# Patient Record
Sex: Female | Born: 1935 | Race: Black or African American | Hispanic: No | Smoking: Former smoker
Health system: Southern US, Community
[De-identification: ages and names within clinical notes are randomized; demographics above are authoritative.]

## PROBLEM LIST (undated history)

## (undated) DIAGNOSIS — M5430 Sciatica, unspecified side: Secondary | ICD-10-CM

## (undated) DIAGNOSIS — M549 Dorsalgia, unspecified: Secondary | ICD-10-CM

## (undated) DIAGNOSIS — Z8249 Family history of ischemic heart disease and other diseases of the circulatory system: Secondary | ICD-10-CM

## (undated) DIAGNOSIS — Z8679 Personal history of other diseases of the circulatory system: Secondary | ICD-10-CM

## (undated) DIAGNOSIS — R51 Headache: Secondary | ICD-10-CM

## (undated) DIAGNOSIS — T85848A Pain due to other internal prosthetic devices, implants and grafts, initial encounter: Secondary | ICD-10-CM

## (undated) DIAGNOSIS — E785 Hyperlipidemia, unspecified: Secondary | ICD-10-CM

## (undated) DIAGNOSIS — Z833 Family history of diabetes mellitus: Secondary | ICD-10-CM

## (undated) DIAGNOSIS — Z8261 Family history of arthritis: Secondary | ICD-10-CM

## (undated) DIAGNOSIS — F329 Major depressive disorder, single episode, unspecified: Secondary | ICD-10-CM

## (undated) DIAGNOSIS — I10 Essential (primary) hypertension: Secondary | ICD-10-CM

## (undated) DIAGNOSIS — E7800 Pure hypercholesterolemia, unspecified: Secondary | ICD-10-CM

## (undated) DIAGNOSIS — E669 Obesity, unspecified: Secondary | ICD-10-CM

## (undated) DIAGNOSIS — F32A Depression, unspecified: Secondary | ICD-10-CM

## (undated) DIAGNOSIS — R0602 Shortness of breath: Secondary | ICD-10-CM

## (undated) HISTORY — DX: Pure hypercholesterolemia, unspecified: E78.00

## (undated) HISTORY — DX: Family history of diabetes mellitus: Z83.3

## (undated) HISTORY — DX: Family history of ischemic heart disease and other diseases of the circulatory system: Z82.49

## (undated) HISTORY — PX: ABDOMINAL HYSTERECTOMY: SHX81

## (undated) HISTORY — DX: Essential (primary) hypertension: I10

## (undated) HISTORY — PX: SPINE SURGERY: SHX786

## (undated) HISTORY — DX: Sciatica, unspecified side: M54.30

## (undated) HISTORY — DX: Obesity, unspecified: E66.9

## (undated) HISTORY — DX: Personal history of other diseases of the circulatory system: Z86.79

## (undated) HISTORY — DX: Hyperlipidemia, unspecified: E78.5

## (undated) HISTORY — DX: Headache: R51

## (undated) HISTORY — DX: Dorsalgia, unspecified: M54.9

## (undated) HISTORY — DX: Family history of arthritis: Z82.61

## (undated) HISTORY — PX: COLONOSCOPY: SHX174

---

## 2000-05-12 ENCOUNTER — Encounter: Payer: Self-pay | Admitting: Family Medicine

## 2000-05-12 ENCOUNTER — Encounter: Admission: RE | Admit: 2000-05-12 | Discharge: 2000-05-12 | Payer: Self-pay | Admitting: Diagnostic Radiology

## 2001-04-25 ENCOUNTER — Ambulatory Visit (HOSPITAL_COMMUNITY): Admission: RE | Admit: 2001-04-25 | Discharge: 2001-04-25 | Payer: Self-pay | Admitting: Internal Medicine

## 2001-04-25 ENCOUNTER — Encounter: Payer: Self-pay | Admitting: Internal Medicine

## 2001-04-28 ENCOUNTER — Encounter (HOSPITAL_COMMUNITY): Admission: RE | Admit: 2001-04-28 | Discharge: 2001-05-28 | Payer: Self-pay | Admitting: Internal Medicine

## 2001-05-17 ENCOUNTER — Encounter: Payer: Self-pay | Admitting: Internal Medicine

## 2001-05-17 ENCOUNTER — Encounter: Admission: RE | Admit: 2001-05-17 | Discharge: 2001-05-17 | Payer: Self-pay | Admitting: Internal Medicine

## 2001-07-04 ENCOUNTER — Other Ambulatory Visit: Admission: RE | Admit: 2001-07-04 | Discharge: 2001-07-04 | Payer: Self-pay | Admitting: Internal Medicine

## 2001-07-11 ENCOUNTER — Ambulatory Visit (HOSPITAL_COMMUNITY): Admission: RE | Admit: 2001-07-11 | Discharge: 2001-07-11 | Payer: Self-pay | Admitting: Internal Medicine

## 2001-07-11 ENCOUNTER — Encounter: Payer: Self-pay | Admitting: Internal Medicine

## 2001-08-29 ENCOUNTER — Encounter: Payer: Self-pay | Admitting: Orthopedic Surgery

## 2001-08-29 ENCOUNTER — Ambulatory Visit (HOSPITAL_COMMUNITY): Admission: RE | Admit: 2001-08-29 | Discharge: 2001-08-29 | Payer: Self-pay | Admitting: Orthopedic Surgery

## 2002-04-19 ENCOUNTER — Encounter: Payer: Self-pay | Admitting: Internal Medicine

## 2002-04-19 ENCOUNTER — Ambulatory Visit (HOSPITAL_COMMUNITY): Admission: RE | Admit: 2002-04-19 | Discharge: 2002-04-19 | Payer: Self-pay | Admitting: Internal Medicine

## 2002-05-18 ENCOUNTER — Encounter: Payer: Self-pay | Admitting: Internal Medicine

## 2002-05-18 ENCOUNTER — Encounter: Admission: RE | Admit: 2002-05-18 | Discharge: 2002-05-18 | Payer: Self-pay | Admitting: Internal Medicine

## 2003-06-10 ENCOUNTER — Encounter: Payer: Self-pay | Admitting: Internal Medicine

## 2003-06-10 ENCOUNTER — Encounter: Admission: RE | Admit: 2003-06-10 | Discharge: 2003-06-10 | Payer: Self-pay | Admitting: Internal Medicine

## 2004-08-13 ENCOUNTER — Encounter: Admission: RE | Admit: 2004-08-13 | Discharge: 2004-08-13 | Payer: Self-pay | Admitting: General Surgery

## 2004-10-09 ENCOUNTER — Ambulatory Visit (HOSPITAL_COMMUNITY): Admission: RE | Admit: 2004-10-09 | Discharge: 2004-10-09 | Payer: Self-pay | Admitting: Family Medicine

## 2004-11-24 ENCOUNTER — Ambulatory Visit: Payer: Self-pay | Admitting: Family Medicine

## 2004-12-24 ENCOUNTER — Ambulatory Visit: Payer: Self-pay | Admitting: Family Medicine

## 2004-12-27 HISTORY — PX: OTHER SURGICAL HISTORY: SHX169

## 2005-02-22 ENCOUNTER — Ambulatory Visit: Payer: Self-pay | Admitting: Family Medicine

## 2005-05-04 ENCOUNTER — Ambulatory Visit: Payer: Self-pay | Admitting: Family Medicine

## 2005-05-04 LAB — CONVERTED CEMR LAB: Pap Smear: NORMAL

## 2005-05-27 ENCOUNTER — Ambulatory Visit (HOSPITAL_COMMUNITY): Admission: RE | Admit: 2005-05-27 | Discharge: 2005-05-27 | Payer: Self-pay | Admitting: General Surgery

## 2005-05-30 ENCOUNTER — Emergency Department (HOSPITAL_COMMUNITY): Admission: EM | Admit: 2005-05-30 | Discharge: 2005-05-30 | Payer: Self-pay | Admitting: Emergency Medicine

## 2005-05-31 ENCOUNTER — Inpatient Hospital Stay (HOSPITAL_COMMUNITY): Admission: AD | Admit: 2005-05-31 | Discharge: 2005-06-07 | Payer: Self-pay | Admitting: Orthopaedic Surgery

## 2005-05-31 ENCOUNTER — Ambulatory Visit: Payer: Self-pay | Admitting: Cardiology

## 2005-06-02 ENCOUNTER — Encounter: Payer: Self-pay | Admitting: Orthopedic Surgery

## 2005-06-09 ENCOUNTER — Emergency Department (HOSPITAL_COMMUNITY): Admission: EM | Admit: 2005-06-09 | Discharge: 2005-06-10 | Payer: Self-pay | Admitting: *Deleted

## 2005-06-28 ENCOUNTER — Emergency Department (HOSPITAL_COMMUNITY): Admission: EM | Admit: 2005-06-28 | Discharge: 2005-06-28 | Payer: Self-pay | Admitting: Emergency Medicine

## 2005-07-20 ENCOUNTER — Ambulatory Visit: Payer: Self-pay | Admitting: Family Medicine

## 2005-08-02 ENCOUNTER — Ambulatory Visit: Payer: Self-pay | Admitting: Family Medicine

## 2005-08-13 ENCOUNTER — Ambulatory Visit (HOSPITAL_COMMUNITY): Admission: RE | Admit: 2005-08-13 | Discharge: 2005-08-13 | Payer: Self-pay | Admitting: Orthopaedic Surgery

## 2005-08-13 ENCOUNTER — Encounter: Payer: Self-pay | Admitting: Orthopedic Surgery

## 2005-08-20 ENCOUNTER — Encounter (HOSPITAL_COMMUNITY): Admission: RE | Admit: 2005-08-20 | Discharge: 2005-09-24 | Payer: Self-pay | Admitting: Orthopaedic Surgery

## 2005-09-14 ENCOUNTER — Ambulatory Visit: Payer: Self-pay | Admitting: Family Medicine

## 2005-09-27 ENCOUNTER — Encounter (HOSPITAL_COMMUNITY): Admission: RE | Admit: 2005-09-27 | Discharge: 2005-10-27 | Payer: Self-pay | Admitting: Orthopaedic Surgery

## 2005-10-28 ENCOUNTER — Encounter (HOSPITAL_COMMUNITY): Admission: RE | Admit: 2005-10-28 | Discharge: 2005-11-27 | Payer: Self-pay | Admitting: Orthopaedic Surgery

## 2005-11-24 ENCOUNTER — Ambulatory Visit: Payer: Self-pay | Admitting: Family Medicine

## 2005-11-24 ENCOUNTER — Ambulatory Visit (HOSPITAL_COMMUNITY): Admission: RE | Admit: 2005-11-24 | Discharge: 2005-11-24 | Payer: Self-pay | Admitting: Family Medicine

## 2006-11-14 ENCOUNTER — Ambulatory Visit: Payer: Self-pay | Admitting: Family Medicine

## 2006-11-15 ENCOUNTER — Ambulatory Visit (HOSPITAL_COMMUNITY): Admission: RE | Admit: 2006-11-15 | Discharge: 2006-11-15 | Payer: Self-pay | Admitting: Family Medicine

## 2006-11-30 ENCOUNTER — Ambulatory Visit: Payer: Self-pay | Admitting: Family Medicine

## 2006-12-26 ENCOUNTER — Ambulatory Visit: Payer: Self-pay | Admitting: Family Medicine

## 2007-02-06 ENCOUNTER — Other Ambulatory Visit: Admission: RE | Admit: 2007-02-06 | Discharge: 2007-02-06 | Payer: Self-pay | Admitting: Family Medicine

## 2007-02-06 ENCOUNTER — Ambulatory Visit: Payer: Self-pay | Admitting: Family Medicine

## 2007-02-06 ENCOUNTER — Encounter: Payer: Self-pay | Admitting: Family Medicine

## 2007-03-06 ENCOUNTER — Ambulatory Visit: Payer: Self-pay | Admitting: Family Medicine

## 2007-03-06 LAB — CONVERTED CEMR LAB
CO2: 23 meq/L (ref 19–32)
Chloride: 108 meq/L (ref 96–112)
HDL: 66 mg/dL (ref >39)
LDL Cholesterol: 112 mg/dL — ABNORMAL HIGH (ref 0–99)
Sodium: 146 meq/L — ABNORMAL HIGH (ref 135–145)
Total CHOL/HDL Ratio: 3.1
VLDL: 24 mg/dL (ref 0–40)

## 2007-12-04 ENCOUNTER — Ambulatory Visit: Payer: Self-pay | Admitting: Family Medicine

## 2007-12-13 ENCOUNTER — Ambulatory Visit (HOSPITAL_COMMUNITY): Admission: RE | Admit: 2007-12-13 | Discharge: 2007-12-13 | Payer: Self-pay | Admitting: Family Medicine

## 2007-12-28 HISTORY — PX: BACK SURGERY: SHX140

## 2008-01-04 ENCOUNTER — Ambulatory Visit: Payer: Self-pay | Admitting: Orthopedic Surgery

## 2008-01-04 DIAGNOSIS — Z8679 Personal history of other diseases of the circulatory system: Secondary | ICD-10-CM | POA: Insufficient documentation

## 2008-01-04 DIAGNOSIS — M5430 Sciatica, unspecified side: Secondary | ICD-10-CM

## 2008-01-04 DIAGNOSIS — M549 Dorsalgia, unspecified: Secondary | ICD-10-CM

## 2008-02-01 ENCOUNTER — Ambulatory Visit: Payer: Self-pay | Admitting: Family Medicine

## 2008-02-07 ENCOUNTER — Encounter: Payer: Self-pay | Admitting: Family Medicine

## 2008-02-07 DIAGNOSIS — E785 Hyperlipidemia, unspecified: Secondary | ICD-10-CM

## 2008-02-07 DIAGNOSIS — R519 Headache, unspecified: Secondary | ICD-10-CM | POA: Insufficient documentation

## 2008-02-07 DIAGNOSIS — R32 Unspecified urinary incontinence: Secondary | ICD-10-CM

## 2008-02-07 DIAGNOSIS — R51 Headache: Secondary | ICD-10-CM

## 2008-02-27 ENCOUNTER — Telehealth: Payer: Self-pay | Admitting: Orthopedic Surgery

## 2008-03-01 ENCOUNTER — Telehealth: Payer: Self-pay | Admitting: Orthopedic Surgery

## 2008-03-05 ENCOUNTER — Emergency Department (HOSPITAL_COMMUNITY): Admission: EM | Admit: 2008-03-05 | Discharge: 2008-03-05 | Payer: Self-pay | Admitting: Emergency Medicine

## 2008-03-12 ENCOUNTER — Ambulatory Visit (HOSPITAL_COMMUNITY): Admission: RE | Admit: 2008-03-12 | Discharge: 2008-03-12 | Payer: Self-pay | Admitting: Internal Medicine

## 2008-03-19 ENCOUNTER — Ambulatory Visit: Payer: Self-pay | Admitting: Orthopedic Surgery

## 2008-03-27 ENCOUNTER — Telehealth: Payer: Self-pay | Admitting: Orthopedic Surgery

## 2008-03-28 ENCOUNTER — Telehealth: Payer: Self-pay | Admitting: Orthopedic Surgery

## 2008-03-29 ENCOUNTER — Encounter: Payer: Self-pay | Admitting: Orthopedic Surgery

## 2008-04-09 ENCOUNTER — Telehealth: Payer: Self-pay | Admitting: Orthopedic Surgery

## 2008-04-11 ENCOUNTER — Encounter: Payer: Self-pay | Admitting: Orthopedic Surgery

## 2008-04-11 ENCOUNTER — Ambulatory Visit (HOSPITAL_COMMUNITY): Admission: RE | Admit: 2008-04-11 | Discharge: 2008-04-12 | Payer: Self-pay | Admitting: Neurological Surgery

## 2008-04-15 ENCOUNTER — Encounter: Payer: Self-pay | Admitting: Orthopedic Surgery

## 2008-08-22 ENCOUNTER — Ambulatory Visit: Payer: Self-pay | Admitting: Family Medicine

## 2008-08-22 DIAGNOSIS — M65311 Trigger thumb, right thumb: Secondary | ICD-10-CM

## 2008-08-22 DIAGNOSIS — J301 Allergic rhinitis due to pollen: Secondary | ICD-10-CM

## 2008-08-22 DIAGNOSIS — M818 Other osteoporosis without current pathological fracture: Secondary | ICD-10-CM | POA: Insufficient documentation

## 2008-09-11 ENCOUNTER — Ambulatory Visit: Payer: Self-pay | Admitting: Orthopedic Surgery

## 2008-10-03 ENCOUNTER — Telehealth: Payer: Self-pay | Admitting: Family Medicine

## 2008-10-09 ENCOUNTER — Encounter: Payer: Self-pay | Admitting: Family Medicine

## 2008-10-09 ENCOUNTER — Ambulatory Visit: Payer: Self-pay | Admitting: Orthopedic Surgery

## 2008-10-09 DIAGNOSIS — M25569 Pain in unspecified knee: Secondary | ICD-10-CM | POA: Insufficient documentation

## 2008-10-09 LAB — CONVERTED CEMR LAB
Basophils Absolute: 0 10*3/uL (ref 0.0–0.1)
Cholesterol: 234 mg/dL — ABNORMAL HIGH (ref 0–200)
Eosinophils Relative: 1 % (ref 0–5)
HDL: 73 mg/dL (ref >39)
Lymphocytes Relative: 37 % (ref 12–46)
Neutro Abs: 5.2 10*3/uL (ref 1.7–7.7)
Neutrophils Relative %: 53 % (ref 43–77)
Platelets: 392 10*3/uL (ref 150–400)
RDW: 16.2 % — ABNORMAL HIGH (ref 11.5–15.5)
Total CHOL/HDL Ratio: 3.2
Triglycerides: 176 mg/dL — ABNORMAL HIGH (ref <150)

## 2008-10-10 ENCOUNTER — Ambulatory Visit: Payer: Self-pay | Admitting: Family Medicine

## 2008-10-10 LAB — CONVERTED CEMR LAB
BUN: 13 mg/dL (ref 6–23)
Glucose, Bld: 89 mg/dL (ref 70–99)
Potassium: 4.3 meq/L (ref 3.5–5.3)
TSH: 1.336 microintl units/mL (ref 0.350–4.50)

## 2008-12-09 ENCOUNTER — Encounter: Payer: Self-pay | Admitting: Orthopedic Surgery

## 2008-12-10 ENCOUNTER — Ambulatory Visit: Payer: Self-pay | Admitting: Family Medicine

## 2008-12-10 DIAGNOSIS — M542 Cervicalgia: Secondary | ICD-10-CM | POA: Insufficient documentation

## 2008-12-23 ENCOUNTER — Ambulatory Visit (HOSPITAL_COMMUNITY): Admission: RE | Admit: 2008-12-23 | Discharge: 2008-12-23 | Payer: Self-pay | Admitting: Family Medicine

## 2009-01-02 ENCOUNTER — Telehealth: Payer: Self-pay | Admitting: Family Medicine

## 2009-01-30 ENCOUNTER — Telehealth: Payer: Self-pay | Admitting: Family Medicine

## 2009-02-24 ENCOUNTER — Encounter: Payer: Self-pay | Admitting: Family Medicine

## 2009-02-24 ENCOUNTER — Ambulatory Visit: Payer: Self-pay | Admitting: Family Medicine

## 2009-02-24 ENCOUNTER — Other Ambulatory Visit: Admission: RE | Admit: 2009-02-24 | Discharge: 2009-02-24 | Payer: Self-pay | Admitting: Family Medicine

## 2009-02-24 DIAGNOSIS — F419 Anxiety disorder, unspecified: Secondary | ICD-10-CM

## 2009-02-24 DIAGNOSIS — R195 Other fecal abnormalities: Secondary | ICD-10-CM

## 2009-02-24 DIAGNOSIS — F329 Major depressive disorder, single episode, unspecified: Secondary | ICD-10-CM

## 2009-02-24 DIAGNOSIS — H543 Unqualified visual loss, both eyes: Secondary | ICD-10-CM | POA: Insufficient documentation

## 2009-02-24 DIAGNOSIS — M79609 Pain in unspecified limb: Secondary | ICD-10-CM | POA: Insufficient documentation

## 2009-03-19 ENCOUNTER — Encounter (INDEPENDENT_AMBULATORY_CARE_PROVIDER_SITE_OTHER): Payer: Self-pay | Admitting: *Deleted

## 2009-04-08 ENCOUNTER — Ambulatory Visit (HOSPITAL_COMMUNITY): Admission: RE | Admit: 2009-04-08 | Discharge: 2009-04-08 | Payer: Self-pay | Admitting: Ophthalmology

## 2009-04-22 ENCOUNTER — Ambulatory Visit (HOSPITAL_COMMUNITY): Admission: RE | Admit: 2009-04-22 | Discharge: 2009-04-22 | Payer: Self-pay | Admitting: Ophthalmology

## 2009-05-06 ENCOUNTER — Ambulatory Visit: Payer: Self-pay | Admitting: Family Medicine

## 2009-07-08 ENCOUNTER — Ambulatory Visit: Payer: Self-pay | Admitting: Family Medicine

## 2009-07-14 ENCOUNTER — Ambulatory Visit (HOSPITAL_COMMUNITY): Admission: RE | Admit: 2009-07-14 | Discharge: 2009-07-14 | Payer: Self-pay | Admitting: Family Medicine

## 2009-08-19 ENCOUNTER — Ambulatory Visit (HOSPITAL_COMMUNITY): Admission: RE | Admit: 2009-08-19 | Discharge: 2009-08-19 | Payer: Self-pay | Admitting: Ophthalmology

## 2009-08-22 ENCOUNTER — Telehealth: Payer: Self-pay | Admitting: Family Medicine

## 2009-10-29 ENCOUNTER — Telehealth: Payer: Self-pay | Admitting: Family Medicine

## 2009-12-10 ENCOUNTER — Ambulatory Visit: Payer: Self-pay | Admitting: Family Medicine

## 2009-12-10 DIAGNOSIS — R0989 Other specified symptoms and signs involving the circulatory and respiratory systems: Secondary | ICD-10-CM

## 2009-12-18 ENCOUNTER — Telehealth: Payer: Self-pay | Admitting: Family Medicine

## 2010-01-05 ENCOUNTER — Ambulatory Visit: Payer: Self-pay | Admitting: Orthopedic Surgery

## 2010-02-03 ENCOUNTER — Telehealth: Payer: Self-pay | Admitting: Family Medicine

## 2010-02-12 ENCOUNTER — Ambulatory Visit: Payer: Self-pay | Admitting: Family Medicine

## 2010-02-12 DIAGNOSIS — R5381 Other malaise: Secondary | ICD-10-CM

## 2010-02-12 DIAGNOSIS — R5383 Other fatigue: Secondary | ICD-10-CM

## 2010-04-28 ENCOUNTER — Ambulatory Visit: Payer: Self-pay | Admitting: Family Medicine

## 2010-04-28 DIAGNOSIS — L259 Unspecified contact dermatitis, unspecified cause: Secondary | ICD-10-CM

## 2010-05-07 ENCOUNTER — Ambulatory Visit: Payer: Self-pay | Admitting: Orthopedic Surgery

## 2010-05-27 ENCOUNTER — Encounter (HOSPITAL_COMMUNITY): Admission: RE | Admit: 2010-05-27 | Discharge: 2010-06-26 | Payer: Self-pay | Admitting: Preventative Medicine

## 2010-07-09 ENCOUNTER — Encounter: Payer: Self-pay | Admitting: Orthopedic Surgery

## 2010-07-24 ENCOUNTER — Telehealth: Payer: Self-pay | Admitting: Family Medicine

## 2010-09-02 ENCOUNTER — Telehealth: Payer: Self-pay | Admitting: Family Medicine

## 2010-09-02 ENCOUNTER — Ambulatory Visit: Payer: Self-pay | Admitting: Orthopedic Surgery

## 2010-09-02 DIAGNOSIS — M549 Dorsalgia, unspecified: Secondary | ICD-10-CM

## 2010-09-07 ENCOUNTER — Ambulatory Visit: Payer: Self-pay | Admitting: Family Medicine

## 2010-09-07 DIAGNOSIS — R7302 Impaired glucose tolerance (oral): Secondary | ICD-10-CM | POA: Insufficient documentation

## 2010-09-07 DIAGNOSIS — M899 Disorder of bone, unspecified: Secondary | ICD-10-CM | POA: Insufficient documentation

## 2010-09-07 DIAGNOSIS — M949 Disorder of cartilage, unspecified: Secondary | ICD-10-CM

## 2010-09-07 DIAGNOSIS — R0602 Shortness of breath: Secondary | ICD-10-CM

## 2010-09-10 ENCOUNTER — Telehealth: Payer: Self-pay | Admitting: Orthopedic Surgery

## 2010-09-11 LAB — CONVERTED CEMR LAB
Basophils Absolute: 0 10*3/uL (ref 0.0–0.1)
CO2: 25 meq/L (ref 19–32)
Calcium: 8.9 mg/dL (ref 8.4–10.5)
Cholesterol: 206 mg/dL — ABNORMAL HIGH (ref 0–200)
Eosinophils Absolute: 0.2 10*3/uL (ref 0.0–0.7)
Eosinophils Relative: 2 % (ref 0–5)
HCT: 40.9 % (ref 36.0–46.0)
HDL: 53 mg/dL (ref >39)
Lymphocytes Relative: 36 % (ref 12–46)
Platelets: 389 10*3/uL (ref 150–400)
RDW: 15.8 % — ABNORMAL HIGH (ref 11.5–15.5)
Sodium: 142 meq/L (ref 135–145)
Total CHOL/HDL Ratio: 3.9
Triglycerides: 168 mg/dL — ABNORMAL HIGH (ref <150)

## 2010-09-14 ENCOUNTER — Telehealth: Payer: Self-pay | Admitting: Orthopedic Surgery

## 2010-09-24 ENCOUNTER — Encounter: Admission: RE | Admit: 2010-09-24 | Discharge: 2010-09-24 | Payer: Self-pay | Admitting: Orthopedic Surgery

## 2010-10-07 ENCOUNTER — Ambulatory Visit: Payer: Self-pay | Admitting: Orthopedic Surgery

## 2010-10-07 DIAGNOSIS — M5126 Other intervertebral disc displacement, lumbar region: Secondary | ICD-10-CM

## 2010-10-08 ENCOUNTER — Encounter (INDEPENDENT_AMBULATORY_CARE_PROVIDER_SITE_OTHER): Payer: Self-pay | Admitting: *Deleted

## 2010-10-13 ENCOUNTER — Telehealth: Payer: Self-pay | Admitting: Orthopedic Surgery

## 2010-10-23 ENCOUNTER — Encounter: Admission: RE | Admit: 2010-10-23 | Discharge: 2010-10-23 | Payer: Self-pay | Admitting: Orthopedic Surgery

## 2010-11-09 ENCOUNTER — Encounter: Admission: RE | Admit: 2010-11-09 | Discharge: 2010-11-09 | Payer: Self-pay | Admitting: Orthopedic Surgery

## 2010-11-12 ENCOUNTER — Telehealth: Payer: Self-pay | Admitting: Family Medicine

## 2010-11-25 ENCOUNTER — Telehealth: Payer: Self-pay | Admitting: Family Medicine

## 2010-12-07 ENCOUNTER — Ambulatory Visit: Payer: Self-pay | Admitting: Family Medicine

## 2010-12-25 ENCOUNTER — Telehealth (INDEPENDENT_AMBULATORY_CARE_PROVIDER_SITE_OTHER): Payer: Self-pay | Admitting: *Deleted

## 2011-01-17 ENCOUNTER — Encounter: Payer: Self-pay | Admitting: Family Medicine

## 2011-01-18 ENCOUNTER — Encounter: Payer: Self-pay | Admitting: Family Medicine

## 2011-01-19 ENCOUNTER — Ambulatory Visit: Admit: 2011-01-19 | Payer: Self-pay | Admitting: Family Medicine

## 2011-01-26 NOTE — Assessment & Plan Note (Signed)
Summary: office visit   Vital Signs:  Patient profile:   77 year old female Menstrual status:  hysterectomy Height:      64 inches Weight:      219.25 pounds BMI:     37.77 O2 Sat:      94 % Pulse rate:   84 / minute Pulse rhythm:   regular Resp:     16 per minute BP sitting:   130 / 74  (left arm) Cuff size:   large  Vitals Entered By: Everitt Amber LPN (Apr 28, 101 3:02 PM)  Nutrition Counseling: Patient's BMI is greater than 25 and therefore counseled on weight management options. CC: Follow up chronic problems   Primary Care Provider:  Syliva Overman MD  CC:  Follow up chronic problems.  History of Present Illness: Pt reports that she has started itching ever since she started tramadol, in  upper arms, axilla, and chest ant and post, this has been going on for the past 2 weeks.  left trigger thumb x 2 weeks .  She states today would have been her 48th wedding anniversarry, she is a bit depressed, misses her dead spouse, and discussesthe costof gettingassistance with transport even to gop to the grocery store. She c/o pain in multiple joints, back hip s and needs a pain med, has used vicodin in the past with good results She isnot consitently exercising, noir has she changed her diet to facilitate any weight loss.  Current Medications (verified): 1)  Oscal 500/200 D-3 500-200 Mg-Unit Tabs (Calcium-Vitamin D) .... Take 1 Tablet By Mouth Three Times A Day 2)  Alprazolam 0.25 Mg Tabs (Alprazolam) .... One Tab By Mouth Qhs 3)  Tramadol Hcl 50 Mg Tabs (Tramadol Hcl) .... Take 1 Tablet By Mouth Two Times A Day As Needed 4)  Antivert 12.5 Mg Tabs (Meclizine Hcl) .... One Tab By Mouth Three Times A Day As Needed 5)  Sertraline Hcl 25 Mg Tabs (Sertraline Hcl) .... Take 1 Tablet By Mouth Once A Day  Allergies (verified): 1)  ! Pcn 2)  ! * Tramadol  Review of Systems      See HPI General:  Complains of fatigue; denies chills and fever. Eyes:  Denies blurring, discharge, eye  pain, and red eye. ENT:  Denies hoarseness, nasal congestion, sinus pressure, and sore throat. CV:  Complains of difficulty breathing while lying down; denies chest pain or discomfort, palpitations, shortness of breath with exertion, and swelling of feet; due to lg abd girth. Resp:  Denies cough and sputum productive. GI:  Denies abdominal pain, constipation, diarrhea, nausea, and vomiting. GU:  Denies dysuria and urinary frequency. MS:  Complains of joint pain and stiffness; right hip and knee pain unccontrolled , wants vicodin in place of tramadol. Derm:  Denies itching, lesion(s), and rash. Neuro:  Denies headaches, poor balance, seizures, and sensation of room spinning. Psych:  Complains of anxiety and depression; notes improvement with the sertraline. Endo:  Denies cold intolerance, excessive hunger, excessive thirst, and excessive urination. Heme:  Denies abnormal bruising and bleeding. Allergy:  Complains of seasonal allergies.  Physical Exam  General:  Well-developed,well-nourished,in no acute distress; alert,appropriate and cooperative throughout examination. HEENT: No facial asymmetry,  EOMI, No sinus tenderness, TM's Clear, oropharynx  pink and moist. decreased ROM cervical spine.   Chest: Clear to auscultation bilaterally.  CVS: S1, S2, No murmurs, No S3.   Abd: Soft, Nontender.  VO:[id number]  ROM spine,and  in hips,adequate in  shoulders and knees.  Ext: No edema.   CNS: CN 2-12 intact, power tone and sensation normal throughout.   Skin: Intact, no visible lesions or rashes.  Psych: Good eye contact, normal affect.  Memory intact, not anxious or depressed appearing.    Impression & Recommendations:  Problem # 1:  TRIGGER FINGER, LEFT THUMB (ICD-727.03) Assessment Comment Only  Orders: Orthopedic Referral (Ortho)  Problem # 2:  DERMATITIS (ICD-692.9) Assessment: Deteriorated  Her updated medication list for this problem includes:    Prednisone (pak) 5 Mg Tabs  (Prednisone) ..... Use as directed reaction to tramadol  Problem # 3:  HYPERLIPIDEMIA (ICD-272.4) Assessment: Comment Only    HDL:73 (10/09/2008), 66 (03/06/2007)  LDL:126 (10/09/2008), 112 (03/06/2007)  Chol:234 (10/09/2008), 202 (03/06/2007)  Trig:176 (10/09/2008), 119 (03/06/2007) low fat diet discussed and encouraged  Problem # 4:  OBESITY (ICD-278.00) Assessment: Unchanged  Ht: 64 (04/28/2010)   Wt: 219.25 (04/28/2010)   BMI: 37.77 (04/28/2010)  Problem # 5:  DEPRESSION (ICD-311) Assessment: Improved  Her updated medication list for this problem includes:    Alprazolam 0.25 Mg Tabs (Alprazolam) ..... One tab by mouth qhs    Sertraline Hcl 25 Mg Tabs (Sertraline hcl) .Marland Kitchen... Take 1 tablet by mouth once a day  Complete Medication List: 1)  Oscal 500/200 D-3 500-200 Mg-unit Tabs (Calcium-vitamin d) .... Take 1 tablet by mouth three times a day 2)  Alprazolam 0.25 Mg Tabs (Alprazolam) .... One tab by mouth qhs 3)  Antivert 12.5 Mg Tabs (Meclizine hcl) .... One tab by mouth three times a day as needed 4)  Sertraline Hcl 25 Mg Tabs (Sertraline hcl) .... Take 1 tablet by mouth once a day 5)  Vicodin 5-500 Mg Tabs (Hydrocodone-acetaminophen) .... Take 1 tablet by mouth two times a day as needed 6)  Prednisone (pak) 5 Mg Tabs (Prednisone) .... Use as directed  Patient Instructions: 1)  Please schedule a follow-up appointment in 3 months. 2)  fasting labs mid june. 3)  New med for pain. 4)  Meds are sent in for itching 5)  It is important that you exercise regularly at least 20 minutes 5 times a week. If you develop chest pain, have severe difficulty breathing, or feel very tired , stop exercising immediately and seek medical attention. 6)  You need to lose weight. Consider a lower calorie diet and regular exercise.  Prescriptions: PREDNISONE (PAK) 5 MG TABS (PREDNISONE) Use as directed  #21 x 0   Entered and Authorized by:   Syliva Overman MD   Signed by:   Syliva Overman MD on  04/28/2010   Method used:   Electronically to        [facility] [address] [phone]* (retail)       [address] [place], [place]  [address]       Ph: [phone]       Fax: [phone]   RxID:   [id number] VICODIN 5-500 MG TABS (HYDROCODONE-ACETAMINOPHEN) Take 1 tablet by mouth two times a day as needed  #60 x 1   Entered and Authorized by:   Syliva Overman MD   Signed by:   Syliva Overman MD on 04/28/2010   Method used:   Printed then faxed to ...       [facility] [address] [phone]* (retail)       [address], [place]  [address]       Ph: [phone]  Fax: [phone]   RxID:   [id number]

## 2011-01-26 NOTE — Assessment & Plan Note (Signed)
Summary: TRIGGER FINGER LT THUMB/REF M.SIMPSON/MEDICARE,TRICARE/CAF   Vital Signs:  Patient profile:   77 year old female Menstrual status:  hysterectomy Height:      63 inches Weight:      219 pounds Pulse rate:   68 / minute Resp:     16 per minute  Vitals Entered By: Fuller Canada MD (May 07, 2010 8:53 AM)  Visit Type:  new problem Referring Provider:  Dr. Lodema Hong Primary Provider:  Syliva Overman MD  CC:  left trigger thumb.  History of Present Illness: 77 year old female presents with triggering of her LEFT thumb previous history of triggering treated with injection x2 with good relief  Comes in today with several weeks of pain over the A1 pulley with triggering and locking phenomenon associated with swelling.    Meds in EMR.    Allergies: 1)  ! Pcn 2)  ! * Tramadol  Past History:  Past Medical History: Last updated: 02/07/2008 High Blood Pressure cholesterol high HEADACHE (ICD-784.0) URINARY INCONTINENCE (ICD-788.30) OBESITY (ICD-278.00) HYPERLIPIDEMIA (ICD-272.4) HYPERTENSION (ICD-401.9) SCIATICA (ICD-724.3) BACK PAIN (ICD-724.5) FAMILY HISTORY OF ARTHRITIS (ICD-V17.7) FAMILY HISTORY CORONARY HEART DISEASE FEMALE < 65 (ICD-V17.3) FAMILY HISTORY OF DIABETES (ICD-V18.0) HIGH BLOOD PRESSURE (ICD-V12.50)  Past Surgical History: Last updated: 08/22/2008 right knee surgery after a fracture in 2006 Hysterectomy Back surgery 2009  Social History: Patient is widowed since 2009 Former Smoker Alcohol use-no Drug use-no Retired one child no caffeine use  Review of Systems Constitutional:  Complains of weight gain; denies weight loss, fever, chills, and fatigue. Cardiovascular:  Denies chest pain, palpitations, fainting, and murmurs. Respiratory:  Complains of wheezing; denies short of breath, couch, tightness, pain on inspiration, and snoring . Gastrointestinal:  Denies heartburn, nausea, vomiting, diarrhea, constipation, and blood in your  stools. Genitourinary:  Denies frequency, urgency, difficulty urinating, painful urination, flank pain, and bleeding in urine. Neurologic:  Denies numbness, tingling, unsteady gait, dizziness, tremors, and seizure. Musculoskeletal:  Denies joint pain, swelling, instability, stiffness, redness, heat, and muscle pain. Endocrine:  Denies excessive thirst, exessive urination, and heat or cold intolerance. Psychiatric:  Complains of depression; denies nervousness, anxiety, and hallucinations. Skin:  Denies changes in the skin, poor healing, rash, itching, and redness. HEENT:  Complains of blurred or double vision; denies eye pain, redness, and watering. Immunology:  Complains of seasonal allergies; denies sinus problems and allergic to bee stings. Hemoatologic:  Denies easy bleeding and brusing.  Physical Exam  Additional Exam:  normal development grooming hygiene.  Normal perfusion to the LEFT thumb.  Skin intact.  Sensation normal and LEFT thumb.  Swelling over the A1 pulley with tenderness LEFT thumb.  Flexion-extension of the IP joint normal.  Motor exam normal flexion power at the IP joint joint stable but triggering and locking noted.     Impression & Recommendations:  Problem # 1:  TRIGGER FINGER, LEFT THUMB (ICD-727.03) Assessment New  Verbal consent was obtained: The finger was prepped with ethyl chloride and injected with 1:1 injection of .25% sensorcaine, 1cc  and 40 mg of depomedrol, 1cc. There were no complications.  Orders: Est. Patient Level III ([address]) Injection, Tendon / Ligament ([address])  Patient Instructions: 1)  You have received an injection of cortisone today. You may experience increased pain at the injection site. Apply ice pack to the area for 20 minutes every 2 hours and take 2 xtra strength tylenol every 8 hours. This increased pain will usually resolve in 24 hours. The injection will take effect in 3-10 days.  2)  Please schedule a follow-up appointment as  needed.

## 2011-01-26 NOTE — Letter (Signed)
Summary: History form  History form   Imported By: Jacklynn Ganong 05/13/2010 09:16:31  _____________________________________________________________________  External Attachment:    Type:   Image     Comment:   External Document

## 2011-01-26 NOTE — Progress Notes (Signed)
Summary: MRI appointment.  Phone Note Outgoing Call   Call placed by: Waldon Reining,  September 10, 2010 3:49 PM Call placed to: Patient Action Taken: Appt scheduled Summary of Call: I called to give the patient her MRI appointment at [facility] on 09-12-10 at 3:30. Patient has Medicare, no precert is needed. Patient is aware to get a copy of her films bring with her to her follow up visit.

## 2011-01-26 NOTE — Progress Notes (Signed)
Summary: vicodin  Phone Note Call from Patient   Summary of Call: patient would like a prescription sent to [facility] for her vicodin.  Please advise thanks Initial call taken by: Curtis Sites,  July 24, 2010 11:49 AM  Follow-up for Phone Call        pls refill x 2 and let her kiow Follow-up by: Syliva Overman MD,  July 24, 2010 12:28 PM  Additional Follow-up for Phone Call Additional follow up Details #1::        [facility] to [facility] Additional Follow-up by: Everitt Amber LPN,  July 24, 2010 1:12 PM    Prescriptions: VICODIN 5-500 MG TABS (HYDROCODONE-ACETAMINOPHEN) Take 1 tablet by mouth two times a day as needed  #60 x 1   Entered by:   Everitt Amber LPN   Authorized by:   Syliva Overman MD   Signed by:   Everitt Amber LPN on 16/09/9603   Method used:   Printed then faxed to ...       [facility] [address] [phone]* (retail)       [address] [place], [place]  [address]       Ph: [phone]       Fax: [phone]   RxID:   [id number]   Appended Document: vicodin patient aware

## 2011-01-26 NOTE — Letter (Signed)
Summary: *Orthopedic No Show Letter  [facility]  [address]  [place], [place] [address]   Phone: [phone]  Fax: [phone]      07/09/2010   Darcey Cardy [address] [place], [place]  [address]    Dear Ms. Cassells,   Our records indicate that you missed your scheduled appointment with Dr. Beaulah Corin on 07/08/2010.  Please contact this office to reschedule your appointment as soon as possible.  It is important that you keep your scheduled appointments with your physician, so we can provide you the best care possible.        Sincerely,    Dr. Terrance Mass, MD [facility] Phone [phone]

## 2011-01-26 NOTE — Progress Notes (Signed)
Summary: refill  Phone Note Call from Patient   Summary of Call: would like to get refill on hydrocodine [phone] [facility] Initial call taken by: Rudene Anda,  September 02, 2010 3:44 PM  Follow-up for Phone Call        refill x1 tell her we have the flu vac also pls Follow-up by: Syliva Overman MD,  September 02, 2010 4:54 PM  Additional Follow-up for Phone Call Additional follow up Details #1::        Phone Call Completed, Rx Called In Additional Follow-up by: Adella Hare LPN,  September 03, 2010 1:46 PM    Prescriptions: VICODIN 5-500 MG TABS (HYDROCODONE-ACETAMINOPHEN) Take 1 tablet by mouth two times a day as needed  #60 x 0   Entered by:   Adella Hare LPN   Authorized by:   Syliva Overman MD   Signed by:   Adella Hare LPN on 78/29/5621   Method used:   Printed then faxed to ...       [facility] [address] [phone]* (retail)       [address], [place]  [address]       Ph: [phone]       Fax: [phone]   RxID:   [id number]

## 2011-01-26 NOTE — Assessment & Plan Note (Signed)
Summary: RE-CK RT KNEE/?NEW XRAY/MEDICARE,TRICARE/CAF   Visit Type:  Follow-up Referring Provider:  Dr. Lodema Hong Primary Provider:  Syliva Overman MD  CC:  right knee pain.  History of Present Illness: 77 year old female status post open treatment internal fixation of patella fracture in 2007 status post disc excision in 2009 fell last week her knee gave out she has some arthritis in the knee complains of lateral leg pain back pain and lateral knee pain  She went to urgent care for pain in her back she was sent to therapy and the back pain has but she still has some residual lateral leg pain which sounds like radiculopathy to me   currently on Vicodin for pain  Denies any bowel or bladder problems recent cancer or weight loss fever or night sweats  Allergies: 1)  ! Pcn 2)  ! * Tramadol 3)  ! * Oxycodone  Past History:  Past Medical History: Last updated: 02/07/2008 High Blood Pressure cholesterol high HEADACHE (ICD-784.0) URINARY INCONTINENCE (ICD-788.30) OBESITY (ICD-278.00) HYPERLIPIDEMIA (ICD-272.4) HYPERTENSION (ICD-401.9) SCIATICA (ICD-724.3) BACK PAIN (ICD-724.5) FAMILY HISTORY OF ARTHRITIS (ICD-V17.7) FAMILY HISTORY CORONARY HEART DISEASE FEMALE < 65 (ICD-V17.3) FAMILY HISTORY OF DIABETES (ICD-V18.0) HIGH BLOOD PRESSURE (ICD-V12.50)  Past Surgical History: Last updated: 08/22/2008 right knee surgery after a fracture in 2006 Hysterectomy Back surgery 2009  Family History: Last updated: 08/22/2008 Family History of Diabetes Family History Coronary Heart Disease  Family History of Arthritis mom  59  breast cancer  htn  dm living and age 66 dad  52  cva  htn deceased at age 83 sister deceased  46  htn , 2 sisters living brothers  4   1 deceased  cva  htn  1  dm   2  htn  Social History: Last updated: 05/07/2010 Patient is widowed since 2009 Former Smoker Alcohol use-no Drug use-no Retired one child no caffeine use  Risk Factors: Caffeine Use: 0  (01/04/2008)  Risk Factors: Smoking Status: quit (02/24/2009)  Social History: Reviewed history from 05/07/2010 and no changes required. Patient is widowed since 2009 Former Smoker Alcohol use-no Drug use-no Retired one child no caffeine use  Physical Exam  Additional Exam:  general appearance is normal she ambulates with a slightly waddling gait she is going to x3 mood and affect are normal  She does have some tenderness in her knee joint.  Her range of motion is approximately 115, her knee is stable.  She has good strength in the knee as well as the foot.  Skin is intact warm dry and pulses are normal no edema is noted sensation is normal.  She has tenderness in her RIGHT gluteal region lower lumbar spine and her straight leg raise seems to be positive.  Her reflexes remain intact     Impression & Recommendations:  Problem # 1:  KNEE PAIN (ICD-719.46)  x-rays of the knee again show mild to moderate arthritis at worst and no signs of internal derangement other than that.  Her hardware in the knee has no pain but it's not symptomatic  Recommend MRI of her back to care for epidural steroid injections noting that she had a 53 laminectomy microdiscectomy in 2009 has had recent back pain which has not improved with therapy or oral pain medication which was Vicodin.  I believe I have ruled out the knee as a cause of the pain by clinical exam and x-ray as well as symptoms  Her updated medication list for this problem includes:    Vicodin 5-500  Mg Tabs (Hydrocodone-acetaminophen) .Marland Kitchen... Take 1 tablet by mouth two times a day as needed  Orders: Est. Patient Level III ([address]) Knee x-ray,  3 views ([address])  Problem # 2:  SPINAL STENOSIS (ICD-724.00)  Orders: Est. Patient Level III (09811)  Patient Instructions: 1)  MRI L SPINE spinal stenosis

## 2011-01-26 NOTE — Progress Notes (Signed)
Summary: [facility] appointment  Phone Note From Other Clinic   Caller: Referral Coordinator Reason for Call: Schedule Patient Appt Summary of Call: Patient has an appointment at [facility] for an [facility] on 10-23-10 at 1:45. Initial call taken by: Waldon Reining,  October 13, 2010 11:38 AM

## 2011-01-26 NOTE — Miscellaneous (Signed)
Summary: faxed referral for [facility] L[facility]  Clinical Lists Changes

## 2011-01-26 NOTE — Progress Notes (Signed)
Summary: dizzy  Phone Note Call from Patient   Summary of Call: pt is dizzy and has some nausea [phone] Initial call taken by: Rudene Anda,  February 03, 2010 1:43 PM  Follow-up for Phone Call        returned call, no answer Follow-up by: Worthy Keeler LPN,  February 03, 2010 2:27 PM  Additional Follow-up for Phone Call Additional follow up Details #1::        feeling a little better  states she was yelling for granddaughter and sat down and head started spinning, says it happened last night too.  is dizzy and makes nauseous  [facility] Additional Follow-up by: Worthy Keeler LPN,  February 03, 2010 2:29 PM    Additional Follow-up for Phone Call Additional follow up Details #2::    advise and erx antivert 12.5 mg one 3 times daily as needed  #20, refill zero  Follow-up by: Syliva Overman MD,  February 03, 2010 4:47 PM  Additional Follow-up for Phone Call Additional follow up Details #3:: Details for Additional Follow-up Action Taken: patient aware and feeling much better  advised med at pharmacy if dizziness returns and if persists, needs appt Additional Follow-up by: Worthy Keeler LPN,  February 04, 2010 9:12 AM  New/Updated Medications: ANTIVERT 12.5 MG TABS (MECLIZINE HCL) one tab by mouth three times a day as needed Prescriptions: ANTIVERT 12.5 MG TABS (MECLIZINE HCL) one tab by mouth three times a day as needed  #20 x 0   Entered by:   Worthy Keeler LPN   Authorized by:   Syliva Overman MD   Signed by:   Worthy Keeler LPN on 91/47/8295   Method used:   Electronically to        [facility] [address] [phone]* (retail)       [address] [place]  [address]       Ph: [phone]       Fax: [phone]   RxID:   [id number]

## 2011-01-26 NOTE — Progress Notes (Signed)
Summary: pt had to re-schedule MRI   Phone Note Call from Patient   Caller: Patient Summary of Call: Patient called to relay that she cancelled her MRI appointment at [facility].  Said transportation did not work out.  She re-scheduled it with them for Thurs 9/22, 4:00pm.  She is again work'g on transportation.  I have cx'd the fol/up appt here for 9/22 as she will need to call back to re-schedule that also. Initial call taken by: Cammie Sickle,  September 14, 2010 4:21 PM

## 2011-01-26 NOTE — Assessment & Plan Note (Signed)
Summary: office visit   Vital Signs:  Patient profile:   77 year old female Menstrual status:  hysterectomy Height:      63 inches Weight:      228 pounds BMI:     40.53 O2 Sat:      97 % Pulse rate:   89 / minute Pulse rhythm:   regular Resp:     16 per minute BP sitting:   130 / 82  (right arm)  Vitals Entered By: Everitt Amber LPN (September 07, 2010 8:48 AM)  Nutrition Counseling: Patient's BMI is greater than 25 and therefore counseled on weight management options. CC: Follow up chronic problems   Primary Care Provider:  Syliva Overman MD  CC:  Follow up chronic problems.  History of Present Illness: Reports  that she ahs not been doing well;states she has been experiencing generalised puritus with bnlisters oon her handsintermittently since her l;ast visit. no new detergents opr pwersonal care items. she continues to experience significant stress   Denies recent fever or chills. Denies sinus pressure, nasal congestion , ear pain or sore throat. Denies chest congestion, or cough productive of sputum. Denies chest pain, palpitations, PND, orthopnea or leg swelling. Denies abdominal pain, nausea, vomitting, diarrhea or constipation. Denies change in bowel movements or bloody stool. Denies dysuria , frequency, incontinence or hesitancy. Denies  joint pain, swelling, or reduced mobility. Denies headaches, vertigo, seizures.     Current Medications (verified): 1)  Oscal 500/200 D-3 500-200 Mg-Unit Tabs (Calcium-Vitamin D) .... Take 1 Tablet By Mouth Three Times A Day 2)  Alprazolam 0.25 Mg Tabs (Alprazolam) .... One Tab By Mouth Qhs 3)  Sertraline Hcl 25 Mg Tabs (Sertraline Hcl) .... Take 1 Tablet By Mouth Once A Day 4)  Vicodin 5-500 Mg Tabs (Hydrocodone-Acetaminophen) .... Take 1 Tablet By Mouth Two Times A Day As Needed  Allergies (verified): 1)  ! Pcn 2)  ! * Tramadol 3)  ! * Oxycodone  Review of Systems      See HPI General:  Complains of fatigue and sleep  disorder; puritus sometimes affets sleep, but this is significantly relieved by benadryl . Eyes:  Denies blurring and discharge. Resp:  Complains of shortness of breath and wheezing; worse in the past 2 months, intermittent in the past 3 months with weight gain. MS:  Complains of joint pain, low back pain, mid back pain, and stiffness; right knee giving  out and pt experienceing back pain radiating down right thigh. Derm:  Complains of itching and rash; generalised itching with vesicular rashes. Neuro:  Complains of sensation of room spinning; pt was standing last week and experienced vertigo, she started spinning and fell backward to her night table , states the following day her symptoms resolved , she used no meds. Psych:  Complains of anxiety and depression; denies suicidal thoughts/plans, thoughts of violence, unusual visions or sounds, and thoughts /plans of harming others; pt reports alot of stress having top dealwith her grand-daughter. Endo:  Denies cold intolerance, excessive hunger, excessive thirst, and excessive urination. Heme:  Denies abnormal bruising and bleeding. Allergy:  Complains of seasonal allergies.  Physical Exam  General:  Well-developed,well-nourished,in no acute distress; alert,appropriate and cooperative throughout examination HEENT: No facial asymmetry,  EOMI, No sinus tenderness, TM's Clear, oropharynx  pink and moist.   Chest: Clear to auscultation bilaterally.  CVS: S1, S2, No murmurs, No S3.   Abd: Soft, Nontender.  MS: Adequate ROM spine, hips, shoulders and knees.  Ext: No edema.  CNS: CN 2-12 intact, power tone and sensation normal throughout.   Skin: Intact, healed blister x2 noted on hand, also erythematous patch on left forearm Psych: Good eye contact, normal affect.  Memory intact, not anxious or depressed appearing.    Impression & Recommendations:  Problem # 1:  SHORTNESS OF BREATH (ICD-786.05) Assessment Deteriorated  Orders: CXR- 2view  (CXR). likely due to weight gain and inc abdominal girth  Problem # 2:  SPINAL STENOSIS (ICD-724.00) Assessment: Deteriorated has upcoming MRI of spine  Problem # 3:  DERMATITIS (ICD-692.9) Assessment: Unchanged keflex prescribed, if symptoms persist pt to call back for derm eval  Problem # 4:  HYPERLIPIDEMIA (ICD-272.4) Assessment: Comment Only  Orders: T-Lipid Profile (04540-98119)    HDL:73 (10/09/2008), 66 (03/06/2007)  LDL:126 (10/09/2008), 112 (03/06/2007)  Chol:234 (10/09/2008), 202 (03/06/2007)  Trig:176 (10/09/2008), 119 (03/06/2007) Low fat diet discussed and encouraged, and literature also given  Problem # 5:  OBESITY (ICD-278.00) Assessment: Deteriorated  Ht: 63 (09/07/2010)   Wt: 228 (09/07/2010)   BMI: 40.53 (09/07/2010) therapeutic lifestyle change discussed and encouraged  Problem # 6:  DEPRESSION (ICD-311) Assessment: Improved  Her updated medication list for this problem includes:    Alprazolam 0.25 Mg Tabs (Alprazolam) ..... One tab by mouth qhs    Sertraline Hcl 25 Mg Tabs (Sertraline hcl) .Marland Kitchen... Take 1 tablet by mouth once a day    Hydroxyzine Hcl 25 Mg Tabs (Hydroxyzine hcl) .Marland Kitchen... Take 1 tab by mouth at bedtime as needed  Complete Medication List: 1)  Oscal 500/200 D-3 500-200 Mg-unit Tabs (Calcium-vitamin d) .... Take 1 tablet by mouth three times a day 2)  Alprazolam 0.25 Mg Tabs (Alprazolam) .... One tab by mouth qhs 3)  Sertraline Hcl 25 Mg Tabs (Sertraline hcl) .... Take 1 tablet by mouth once a day 4)  Vicodin 5-500 Mg Tabs (Hydrocodone-acetaminophen) .... Take 1 tablet by mouth two times a day as needed 5)  Hydroxyzine Hcl 25 Mg Tabs (Hydroxyzine hcl) .... Take 1 tab by mouth at bedtime as needed 6)  Keflex 500 Mg Caps (Cephalexin) .... Take 1 capsule by mouth two times a day  Other Orders: Influenza Vaccine MCR ([address]782) T-CBC w/Diff [phone]) T-TSH [phone]) T- Hemoglobin A1C (84132-44010) T-Basic Metabolic Panel  (27253-66440) T-Vitamin D (25-Hydroxy) (34742-59563)  Patient Instructions: 1)  Please schedule a follow-up appointment in 3 months. 2)  It is important that you exercise regularly at least 20 minutes 5 times a week. If you develop chest pain, have severe difficulty breathing, or feel very tired , stop exercising immediately and seek medical attention. 3)  You need to lose weight. Consider a lower calorie diet and regular exercise.  4)  Pls call back if you continue to have itching with rash. 5)  Meds are sent in for this and your bP is good. 6)  Pls sched your mamo Prescriptions: ALPRAZOLAM 0.25 MG TABS (ALPRAZOLAM) ONE TAB by mouth QHS  #30 x 4   Entered by:   Everitt Amber LPN   Authorized by:   Syliva Overman MD   Signed by:   Everitt Amber LPN on 87/56/4332   Method used:   Printed then faxed to ...       [facility] [address] [phone]* (retail)       [address] [place], [place]  [address]       Ph: [phone]       Fax: [phone]   RxID:   [id number] SERTRALINE HCL  25 MG TABS (SERTRALINE HCL) Take 1 tablet by mouth once a day  #30 x 4   Entered by:   Everitt Amber LPN   Authorized by:   Syliva Overman MD   Signed by:   Everitt Amber LPN on 60/45/4098   Method used:   Printed then faxed to ...       [facility] [address] [phone]* (retail)       [address] [place], [place]  [address]       Ph: [phone]       Fax: [phone]   RxID:   [id number] KEFLEX 500 MG CAPS (CEPHALEXIN) Take 1 capsule by mouth two times a day  #10 x 0   Entered and Authorized by:   Syliva Overman MD   Signed by:   Syliva Overman MD on 09/07/2010   Method used:   Electronically to        [facility] [address] [phone]* (retail)       [address] [place], [place]  [address]       Ph: [phone]       Fax: [phone]   RxID:   [id number] HYDROXYZINE HCL 25 MG TABS (HYDROXYZINE HCL) Take 1 tab by mouth at bedtime as needed  #30 x 3   Entered and Authorized  by:   Syliva Overman MD   Signed by:   Syliva Overman MD on 09/07/2010   Method used:   Electronically to        [facility] [address] [phone]* (retail)       [address] [place]  [address]       Ph: [phone]       Fax: [phone]   RxID:   [id number]    Influenza Vaccine (to be given today)

## 2011-01-26 NOTE — Progress Notes (Signed)
Summary: medicine  Phone Note Call from Patient   Summary of Call: needs her hydroco. send to [facility] send her medicine there until dec 31 she will have to change phar. and also feels like she water ih her ear and what is good for that send to [facility] or call her back at [phone] Initial call taken by: Lind Guest,  November 12, 2010 1:49 PM  Follow-up for Phone Call        Phone Call Completed Follow-up by: Adella Hare LPN,  November 12, 2010 3:05 PM    Prescriptions: VICODIN 5-500 MG TABS (HYDROCODONE-ACETAMINOPHEN) Take 1 tablet by mouth two times a day as needed  #60 x 0   Entered by:   Adella Hare LPN   Authorized by:   Syliva Overman MD   Signed by:   Adella Hare LPN on 56/38/7564   Method used:   Printed then faxed to ...       [facility] [address] [phone]* (retail)       [address], [place]  [address]       Ph: [phone]       Fax: [phone]   RxID:   [id number]

## 2011-01-26 NOTE — Assessment & Plan Note (Signed)
Summary: TRIGGER FINGER/MEDICARE/TRICARE/SIMPSON/BSF   Visit Type:  Follow-up Primary Provider:  Syliva Overman MD  CC:  triggering RIGHT long finger.  History of Present Illness: I saw [name] in the office today for a followup visit.  She is a 77 years old woman with the complaint of:  TRIGGER FINGER.  DX:  Trigger finger, right middle finger.  Treatment:  Injection.  MEDS:   Complaints:She says that her finger has been bothering her for 3 months now, comes and goes.  Today, scheduled for:  Requesting injection.  RIGHT long finger.  Tenderness over the A1 pulley with a catching sensation and palpable clicking with flexion of the digit. Alignment is normal.  Injection for triggering.  Verbal consent was obtained: The finger was prepped with ethyl chloride and injected with 1:1 injection of .25% sensorcaine, 1cc  and 40 mg of depomedrol, 1cc. There were no complications.   Assessment trigger finger recurrence.  Recommend followup as needed   Allergies: 1)  ! Pcn   Impression & Recommendations:  Problem # 1:  TRIGGER FINGER, RIGHT MIDDLE (ICD-727.03) Assessment Deteriorated  Orders: Est. Patient Level III ([address]) Depo- Medrol 40mg  (J1030) Joint Aspirate / Injection, Small (63016)  Patient Instructions: 1)  You have received an injection of cortisone today. You may experience increased pain at the injection site. Apply ice pack to the area for 20 minutes every 2 hours and take 2 xtra strength tylenol every 8 hours. This increased pain will usually resolve in 24 hours. The injection will take effect in 3-10 days.   2)  wants appt in hthe spring for the knee

## 2011-01-26 NOTE — Assessment & Plan Note (Signed)
Summary: F UP   Vital Signs:  Patient profile:   77 year old female Menstrual status:  hysterectomy Height:      64 inches Weight:      219.75 pounds BMI:     37.86 O2 Sat:      93 % Pulse rate:   91 / minute Pulse rhythm:   regular Resp:     16 per minute BP sitting:   120 / 60  (left arm) Cuff size:   large  Vitals Entered By: Everitt Amber LPN (February 12, 2010 2:07 PM)  Nutrition Counseling: Patient's BMI is greater than 25 and therefore counseled on weight management options. CC: Follow up chronic problems Is Patient Diabetic? No   Primary Care Provider:  Syliva Overman MD  CC:  Follow up chronic problems.  History of Present Illness: Reports  that she is doing fair. She gets depressed and lonely at times, missing her spouse, however she has alotof family member around. Denies recent fever or chills. Denies sinus pressure, nasal congestion , ear pain or sore throat. Denies chest congestion, or cough productive of sputum. Denies chest pain, palpitations, PND, orthopnea or leg swelling. Denies abdominal pain, nausea, vomitting, diarrhea or constipation. Denies change in bowel movements or bloody stool. Denies dysuria , frequency, incontinence or hesitancy. Reports back and joint pain,and reducedmobility. Denies headaches, vertigo, seizures. Denies depression, anxiety or insomnia. Denies  rash, lesions, or itch.     Current Medications (verified): 1)  Oscal 500/200 D-3 500-200 Mg-Unit Tabs (Calcium-Vitamin D) .... Take 1 Tablet By Mouth Three Times A Day 2)  Alprazolam 0.25 Mg Tabs (Alprazolam) .... One Tab By Mouth Qhs 3)  Tramadol Hcl 50 Mg Tabs (Tramadol Hcl) .... Take 1 Tablet By Mouth Two Times A Day As Needed 4)  Antivert 12.5 Mg Tabs (Meclizine Hcl) .... One Tab By Mouth Three Times A Day As Needed  Allergies (verified): 1)  ! Pcn  Review of Systems      See HPI Eyes:  Denies blurring and discharge. Heme:  Denies abnormal bruising and  bleeding. Allergy:  Complains of seasonal allergies.  Physical Exam  General:  Well-developed,well-nourished,in no acute distress; alert,appropriate and cooperative throughout examination. HEENT: No facial asymmetry,  EOMI, No sinus tenderness, TM's Clear, oropharynx  pink and moist. decreased ROM cervical spine. bilateral bruits  Chest: Clear to auscultation bilaterally.  CVS: S1, S2, No murmurs, No S3.   Abd: Soft, Nontender.  ZO:[id number]  ROM spine,adequate in hips, shoulders and knees.  Ext: No edema.   CNS: CN 2-12 intact, power tone and sensation normal throughout.   Skin: Intact, no visible lesions or rashes.  Psych: Good eye contact, normal affect.  Memory intact, not anxious or depressed appearing.    Impression & Recommendations:  Problem # 1:  DEPRESSION (ICD-311) Assessment Deteriorated  Her updated medication list for this problem includes:    Alprazolam 0.25 Mg Tabs (Alprazolam) ..... One tab by mouth qhs    Sertraline Hcl 25 Mg Tabs (Sertraline hcl) .Marland Kitchen... Take 1 tablet by mouth once a day  Problem # 2:  KNEE PAIN (JWJ-191.47) Assessment: Deteriorated  Her updated medication list for this problem includes:    Tramadol Hcl 50 Mg Tabs (Tramadol hcl) .Marland Kitchen... Take 1 tablet by mouth two times a day as needed  Problem # 3:  HYPERTENSION (ICD-401.9) Assessment: Improved  Orders: T-Basic Metabolic Panel [phone])  BP today: 120/60 Prior BP: 130/90 (12/10/2009)  Labs Reviewed: K+: 4.3 (10/10/2008) Creat: : 0.68 (10/10/2008)  Chol: 234 (10/09/2008)   HDL: 73 (10/09/2008)   LDL: 126 (10/09/2008)   TG: 176 (10/09/2008)  Problem # 4:  HYPERLIPIDEMIA (ICD-272.4) Assessment: Comment Only  Orders: T-Hepatic Function ([id number]) T-Lipid Profile ([id number])    HDL:73 (10/09/2008), 66 (03/06/2007)  LDL:126 (10/09/2008), 112 (03/06/2007)  Chol:234 (10/09/2008), 202 (03/06/2007)  Trig:176 (10/09/2008), 119 (03/06/2007)  Complete Medication List: 1)  Oscal  500/200 D-3 500-200 Mg-unit Tabs (Calcium-vitamin d) .... Take 1 tablet by mouth three times a day 2)  Alprazolam 0.25 Mg Tabs (Alprazolam) .... One tab by mouth qhs 3)  Tramadol Hcl 50 Mg Tabs (Tramadol hcl) .... Take 1 tablet by mouth two times a day as needed 4)  Antivert 12.5 Mg Tabs (Meclizine hcl) .... One tab by mouth three times a day as needed 5)  Sertraline Hcl 25 Mg Tabs (Sertraline hcl) .... Take 1 tablet by mouth once a day  Other Orders: T-TSH ([address]-78469) T-CBC w/Diff [phone]) T-Vitamin D (25-Hydroxy) [phone]) Future Orders: Radiology Referral (Radiology) ... 02/13/2010  Patient Instructions: 1)  Please schedule a follow-up appointment in 2 months. 2)  BMP prior to visit, ICD-9: 3)  Hepatic Panel prior to visit, ICD-9: 4)  Lipid Panel prior to visit, ICD-9:  fasting asap 5)  TSH prior to visit, ICD-9: 6)  CBC w/ Diff prior to visit, ICD-9: 7)  vitamin d level 8)  It is important that you exercise regularly at least 20 minutes 5 times a week. If you develop chest pain, have severe difficulty breathing, or feel very tired , stop exercising immediately and seek medical attention. 9)  You need to lose weight. Consider a lower calorie diet and regular exercise.  Prescriptions: TRAMADOL HCL 50 MG TABS (TRAMADOL HCL) Take 1 tablet by mouth two times a day as needed  #60 x 5   Entered by:   Everitt Amber LPN   Authorized by:   Syliva Overman MD   Signed by:   Everitt Amber LPN on 66/44/0347   Method used:   Printed then faxed to ...       [facility] [address] [phone]* (retail)       [address] [place]  [address]       Ph: [phone]       Fax: [phone]   RxID:   [id number] ALPRAZOLAM 0.25 MG TABS (ALPRAZOLAM) ONE TAB by mouth QHS  #30 x 5   Entered by:   Everitt Amber LPN   Authorized by:   Syliva Overman MD   Signed by:   Everitt Amber LPN on 42/70/6237   Method used:   Printed then faxed to ...       [facility] [address] [phone]*  (retail)       [address] [place], [place]  [address]       Ph: [phone]       Fax: [phone]   RxID:   [id number] SERTRALINE HCL 25 MG TABS (SERTRALINE HCL) Take 1 tablet by mouth once a day  #30 x 2   Entered and Authorized by:   Syliva Overman MD   Signed by:   Syliva Overman MD on 02/12/2010   Method used:   Electronically to        [facility] [address] [phone]* (retail)       [address]       [place], [place]  [address]  Ph: [phone]       Fax: [phone]   RxID:   [id number]

## 2011-01-26 NOTE — Assessment & Plan Note (Signed)
Summary: office viist   Vital Signs:  Patient profile:   77 year old female Menstrual status:  hysterectomy Height:      64 inches Weight:      212.31 pounds BMI:     36.57 O2 Sat:      94 % Pulse rate:   87 / minute Pulse rhythm:   regular Resp:     16 per minute BP sitting:   122 / 80  (left arm) Cuff size:   large  Vitals Entered By: Everitt Amber (July 08, 2009 3:05 PM)  Nutrition Counseling: Patient's BMI is greater than 25 and therefore counseled on weight management options. CC: Follow up chronic problems   CC:  Follow up chronic problems.  History of Present Illness: Pt . c/o uncontrolled deprtession. She statesshe is very irritable, overwhelmed, cries all the tome and thinkls that her continued deterioration is due to the fact that she still grieves her deceased spouse.She is not suicidal or homicidal.she is willing to see a therapist for help. She denies any recent fever or chills, She denies head or chest congestion. She denies dysuria , frequency and has some incontinence. She continues to experience chronic neck and back pain.Shehas had no falls.  Current Medications (verified): 1)  Lotensin Hct 10-12.5 Mg Tabs (Benazepril-Hydrochlorothiazide) .... Take 1 Tablet By Mouth Once A Day 2)  Oscal 500/200 D-3 500-200 Mg-Unit Tabs (Calcium-Vitamin D) .... Take 1 Tablet By Mouth Three Times A Day 3)  Alprazolam 0.25 Mg Tabs (Alprazolam) .... One Tab By Mouth Qhs 4)  Lovastatin 20 Mg Tabs (Lovastatin) .... Take 1 Tab By Mouth At Bedtime 5)  Fluoxetine Hcl 10 Mg Tabs (Fluoxetine Hcl) .... Take 1 Tablet By Mouth Once A Day 6)  Hydrocodone-Acetaminophen 5-500 Mg Tabs (Hydrocodone-Acetaminophen) .... Take 1 Tablet By Mouth Two Times A Day As Needed  Allergies (verified): 1)  ! Pcn  Review of Systems      See HPI General:  Complains of malaise and sleep disorder; denies chills and fever. ENT:  Denies nasal congestion and sinus pressure. CV:  Denies chest pain or  discomfort, palpitations, and swelling of feet. Resp:  Denies cough, shortness of breath, and sputum productive. GI:  Denies abdominal pain, constipation, diarrhea, nausea, and vomiting. GU:  See HPI. MS:  See HPI. Derm:  Denies itching, lesion(s), and rash. Neuro:  Complains of headaches; denies falling down and weakness. Psych:  Complains of anxiety, depression, easily angered, easily tearful, irritability, and mental problems; denies suicidal thoughts/plans and thoughts of violence; progressive deterioration in mental health since her spouse passed Dec 2009. Heme:  Denies abnormal bruising and bleeding. Allergy:  Complains of seasonal allergies.  Physical Exam  General:  alert, well-hydrated, and overweight-appearing.   HEENT: No facial asymmetry,  EOMI, No sinus tenderness, TM's Clear, oropharynx  pink and moist.   Chest: Clear to auscultation bilaterally.  CVS: S1, S2, No murmurs, No S3.   Abd: Soft, Nontender.  MS: decreased ROM spine,adequate in hips, shoulders and knees.  Ext: No edema.   CNS: CN 2-12 intact, power tone and sensation normal throughout.   Skin: Intact, no visible lesions or rashes.  Psych: Good eye contact, normal affect.  Memory intact,both anxious and depressed appearing.Tearful.    Impression & Recommendations:  Problem # 1:  DEPRESSION (ICD-311) Assessment Deteriorated  The following medications were removed from the medication list:    Fluoxetine Hcl 10 Mg Tabs (Fluoxetine hcl) .Marland Kitchen... Take 1 tablet by mouth once a day Her  updated medication list for this problem includes:    Alprazolam 0.25 Mg Tabs (Alprazolam) ..... One tab by mouth qhs    Fluoxetine Hcl 20 Mg Caps (Fluoxetine hcl) .Marland Kitchen... Take 1 capsule by mouth once a day  Orders: Psychiatric Referral (Psych)  Problem # 2:  NECK PAIN (ICD-723.1) Assessment: Unchanged  Her updated medication list for this problem includes:    Hydrocodone-acetaminophen 5-500 Mg Tabs (Hydrocodone-acetaminophen)  .Marland Kitchen... Take 1 tablet by mouth two times a day as needed  Problem # 3:  URINARY INCONTINENCE (ICD-788.30) Assessment: Unchanged pt reminded about [place] exercises  Problem # 4:  HYPERTENSION (ICD-401.9) Assessment: Improved  Her updated medication list for this problem includes:    Lotensin Hct 10-12.5 Mg Tabs (Benazepril-hydrochlorothiazide) .Marland Kitchen... Take 1 tablet by mouth once a day  BP today: 122/80 Prior BP: 150/90 (05/06/2009)  Labs Reviewed: K+: 4.3 (10/10/2008) Creat: : 0.68 (10/10/2008)   Chol: 234 (10/09/2008)   HDL: 73 (10/09/2008)   LDL: 126 (10/09/2008)   TG: 176 (10/09/2008)  Problem # 5:  HYPERLIPIDEMIA (ICD-272.4) Assessment: Comment Only  Her updated medication list for this problem includes:    Lovastatin 20 Mg Tabs (Lovastatin) .Marland Kitchen... Take 1 tab by mouth at bedtime    HDL:73 (10/09/2008), 66 (03/06/2007)  LDL:126 (10/09/2008), 112 (03/06/2007)  Chol:234 (10/09/2008), 202 (03/06/2007)  Trig:176 (10/09/2008), 119 (03/06/2007)  Problem # 6:  OBESITY (ICD-278.00) Assessment: Unchanged  Ht: 64 (07/08/2009)   Wt: 212.31 (07/08/2009)   BMI: 36.57 (07/08/2009)  Complete Medication List: 1)  Lotensin Hct 10-12.5 Mg Tabs (Benazepril-hydrochlorothiazide) .... Take 1 tablet by mouth once a day 2)  Oscal 500/200 D-3 500-200 Mg-unit Tabs (Calcium-vitamin d) .... Take 1 tablet by mouth three times a day 3)  Alprazolam 0.25 Mg Tabs (Alprazolam) .... One tab by mouth qhs 4)  Lovastatin 20 Mg Tabs (Lovastatin) .... Take 1 tab by mouth at bedtime 5)  Hydrocodone-acetaminophen 5-500 Mg Tabs (Hydrocodone-acetaminophen) .... Take 1 tablet by mouth two times a day as needed 6)  Fluoxetine Hcl 20 Mg Caps (Fluoxetine hcl) .... Take 1 capsule by mouth once a day  Other Orders: Radiology Referral (Radiology)  Patient Instructions: 1)  Please schedule a follow-up appointment in 2 months. 2)  It is important that you exercise regularly at least 20 minutes 5 times a week. If you develop  chest pain, have severe difficulty breathing, or feel very tired , stop exercising immediately and seek medical attention. 3)  You need to lose weight. Consider a lower calorie diet and regular exercise.  4)  I am glad that you have decided on therapy, I have increased the dose of your prozac also. Prescriptions: ALPRAZOLAM 0.25 MG TABS (ALPRAZOLAM) ONE TAB by mouth QHS  #14 x 0   Entered by:   Worthy Keeler LPN   Authorized by:   Syliva Overman MD   Signed by:   Worthy Keeler LPN on 16/09/9603   Method used:   Printed then faxed to ...       [facility] [address] [phone]* (retail)       [address] [place], [place]  [address]       Ph: [phone]       Fax: [phone]   RxID:   [id number] LOTENSIN HCT 10-12.5 MG TABS (BENAZEPRIL-HYDROCHLOROTHIAZIDE) Take 1 tablet by mouth once a day  #30 x 5   Entered by:   Worthy Keeler LPN   Authorized by:   Syliva Overman MD  Signed by:   Worthy Keeler LPN on 04/54/0981   Method used:   Printed then faxed to ...       [facility] [address] [phone]* (retail)       [address] [place], [place]  [address]       Ph: [phone]       Fax: [phone]   RxID:   [id number] FLUOXETINE HCL 20 MG CAPS (FLUOXETINE HCL) Take 1 capsule by mouth once a day  #30 x 2   Entered and Authorized by:   Syliva Overman MD   Signed by:   Syliva Overman MD on 07/08/2009   Method used:   Electronically to        [facility] [address] [phone]* (retail)       [address] [place]  [address]       Ph: [phone]       Fax: [phone]   RxID:   [id number]

## 2011-01-26 NOTE — Assessment & Plan Note (Signed)
Summary: mri results bringing filmmcr/tricare/bsf   Visit Type:  Follow-up Referring Provider:  Dr. Lodema Hong Primary Provider:  Syliva Overman MD  CC:  mri results l spine.  History of Present Illness: I saw [name] in the office today for a followup visit.  She is a 77 years old woman with the complaint of:  back pain into right leg, previous spine surgery.  MRI [facility] 09/24/10.  IMPRESSION:   1.  Status post left hemilaminotomy and partial resection of the L5- S1 lumbar disc. 2.  Slight progression of broad-based disc bulging and facet hypertrophy at L4-5 with mild lateral recess and foraminal narrowing, worse on the left. 3.  Stable bilateral foraminal narrowing at L2-3 and L3-4.  medications Vicodin 5 mg for pain which does give her some relief  Based on the above MRI findings in review of the images I have advised the patient to seek either neurosurgical followup or L4-L5 epidural series.  She would like to proceed with the injections and we will arrange it.       Allergies: 1)  ! Pcn 2)  ! * Tramadol 3)  ! * Oxycodone  Past History:  Past Surgical History: right knee surgery after a fracture in 2006 Hysterectomy Back surgery 2009  Dr. Juliann Pares Neurosurgeon   Impression & Recommendations:  Problem # 1:  DEGENERATIVE DISC DISEASE, LUMBOSACRAL SPINE W/RADICULOPATHY (ICD-722.10) Assessment Deteriorated  Orders: Est. Patient Level II ([address])  Other Orders: Misc. Referral (Misc. Ref)  Patient Instructions: 1)  L4-5 ESI  2)  follow up as needed  3)  Call the office if you dont get relief from the Epidurals

## 2011-01-26 NOTE — Progress Notes (Signed)
Summary: vicodin [facility]   Phone Note Call from Patient   Summary of Call: patient called in states she was trying to get her Vicodin that was called in for her on the 17th of Nov.  she has called [facility] and they stated they didn't have prescription.  I called to verify and they state they didn't get the prescription.  Please refax to [facility].  I will notify patient that it will be sent could take up to 48 hours. Initial call taken by: Curtis Sites,  November 25, 2010 3:28 PM    Prescriptions: VICODIN 5-500 MG TABS (HYDROCODONE-ACETAMINOPHEN) Take 1 tablet by mouth two times a day as needed  #60 x 0   Entered by:   Adella Hare LPN   Authorized by:   Syliva Overman MD   Signed by:   Adella Hare LPN on 16/09/9603   Method used:   Printed then faxed to ...       [facility] [address] [phone]* (retail)       [address] [place]  [address]       Ph: [phone]       Fax: [phone]   RxID:   [id number]

## 2011-01-28 NOTE — Letter (Signed)
Summary: 1st missed letter  1st missed letter   Imported By: Lind Guest 12/09/2010 10:39:42  _____________________________________________________________________  External Attachment:    Type:   Image     Comment:   External Document

## 2011-01-28 NOTE — Progress Notes (Signed)
Summary: please advise  Phone Note Call from Patient   Summary of Call: patient called in and wanted to be seen for an itch that she was having on her hand, and she states puss might be coming out.  I advised her to go to urgent care since I didn't have an appt. until the end of January.  She said there was several reasons she wanted to come in.  When I advised her it would be the end of January she said "I don't want to wait until then" and said Good bye. Initial call taken by: Curtis Sites,  December 25, 2010 10:08 AM  Follow-up for Phone Call        c/o generalised itching and rash , wants to seee dermatology , this has been intermittent since January Follow-up by: Syliva Overman MD,  December 25, 2010 11:35 AM  Additional Follow-up for Phone Call Additional follow up Details #1::        plds refer to drmatology and provide pt with appt Additional Follow-up by: Syliva Overman MD,  December 25, 2010 11:36 AM    Additional Follow-up for Phone Call Additional follow up Details #2::    called Dr. Scharlene Gloss office and they were already closed.  I will call them again on Tuesday.  I called patient to inform her and she said thank you and I told her I would call her back on Tuesday. Follow-up by: Curtis Sites,  December 25, 2010 3:20 PM  Additional Follow-up for Phone Call Additional follow up Details #3:: Details for Additional Follow-up Action Taken: Patient has an appt with Dr. Andi Devon on Jan. 12,202 at Dr. Marcelo Baldy office. and patient is aware of appt.  Additional Follow-up by: Curtis Sites,  December 31, 2010 3:20 PM

## 2011-02-15 ENCOUNTER — Telehealth: Payer: Self-pay | Admitting: Family Medicine

## 2011-02-19 ENCOUNTER — Telehealth: Payer: Self-pay | Admitting: Family Medicine

## 2011-02-23 NOTE — Progress Notes (Signed)
Summary: PAIN  MEDICINE  Phone Note Call from Patient   Summary of Call: NEEDS HER PAIN MEDICINE SEND TO [facility] ASAP Initial call taken by: Lind Guest,  February 19, 2011 9:01 AM  Follow-up for Phone Call        called pharmacy and they have script Follow-up by: Adella Hare LPN,  February 19, 2011 9:03 AM

## 2011-02-23 NOTE — Progress Notes (Signed)
Summary: refill  Phone Note Call from Patient   Summary of Call: needs a refill of hydrocodone [phone] [place]  Initial call taken by: Rudene Anda,  February 15, 2011 2:12 PM    Prescriptions: VICODIN 5-500 MG TABS (HYDROCODONE-ACETAMINOPHEN) Take 1 tablet by mouth two times a day as needed  #60 x 0   Entered by:   Adella Hare LPN   Authorized by:   Syliva Overman MD   Signed by:   Adella Hare LPN on 45/40/9811   Method used:   Printed then faxed to ...       [facility] (retail)       [address]       [place], [place]  [address]       Ph: [phone]       Fax: [phone]   RxID:   [id number]

## 2011-03-19 ENCOUNTER — Encounter: Payer: Self-pay | Admitting: Family Medicine

## 2011-03-22 ENCOUNTER — Encounter: Payer: Self-pay | Admitting: Family Medicine

## 2011-03-23 ENCOUNTER — Encounter: Payer: Self-pay | Admitting: Family Medicine

## 2011-03-23 ENCOUNTER — Other Ambulatory Visit: Payer: Self-pay | Admitting: Family Medicine

## 2011-03-23 ENCOUNTER — Ambulatory Visit (INDEPENDENT_AMBULATORY_CARE_PROVIDER_SITE_OTHER): Payer: Medicare Other | Admitting: Family Medicine

## 2011-03-23 VITALS — BP 122/84 | HR 103 | Temp 99.1°F | Resp 16 | Ht 64.5 in | Wt 219.0 lb

## 2011-03-23 DIAGNOSIS — F32A Depression, unspecified: Secondary | ICD-10-CM

## 2011-03-23 DIAGNOSIS — J329 Chronic sinusitis, unspecified: Secondary | ICD-10-CM

## 2011-03-23 DIAGNOSIS — J209 Acute bronchitis, unspecified: Secondary | ICD-10-CM

## 2011-03-23 DIAGNOSIS — R5381 Other malaise: Secondary | ICD-10-CM

## 2011-03-23 DIAGNOSIS — M542 Cervicalgia: Secondary | ICD-10-CM

## 2011-03-23 DIAGNOSIS — G8929 Other chronic pain: Secondary | ICD-10-CM

## 2011-03-23 DIAGNOSIS — R5383 Other fatigue: Secondary | ICD-10-CM

## 2011-03-23 DIAGNOSIS — F329 Major depressive disorder, single episode, unspecified: Secondary | ICD-10-CM

## 2011-03-23 DIAGNOSIS — Z13220 Encounter for screening for lipoid disorders: Secondary | ICD-10-CM

## 2011-03-23 DIAGNOSIS — Z79899 Other long term (current) drug therapy: Secondary | ICD-10-CM

## 2011-03-23 MED ORDER — IPRATROPIUM BROMIDE 0.02 % IN SOLN
0.5000 mg | RESPIRATORY_TRACT | Status: DC
  Administered 2011-03-23: 0.5 mg via RESPIRATORY_TRACT

## 2011-03-23 MED ORDER — BENZONATATE 100 MG PO CAPS: 100.0000 mg | ORAL_CAPSULE | Freq: Three times a day (TID) | ORAL | Status: DC | PRN

## 2011-03-23 MED ORDER — DOXYCYCLINE HYCLATE 100 MG PO TABS: 100.0000 mg | ORAL_TABLET | Freq: Two times a day (BID) | ORAL | Status: AC

## 2011-03-23 MED ORDER — METHYLPREDNISOLONE ACETATE 80 MG/ML IJ SUSP
80.0000 mg | Freq: Once | INTRAMUSCULAR | Status: AC
  Administered 2011-03-23: 80 mg via INTRAMUSCULAR

## 2011-03-23 MED ORDER — ALBUTEROL SULFATE (2.5 MG/3ML) 0.083% IN NEBU
2.5000 mg | INHALATION_SOLUTION | RESPIRATORY_TRACT | Status: DC
  Administered 2011-03-23: 2.5 mg via RESPIRATORY_TRACT

## 2011-03-23 MED ORDER — PREDNISONE (PAK) 5 MG PO TABS: 5.0000 mg | ORAL_TABLET | ORAL | Status: DC

## 2011-03-23 MED ORDER — KETOROLAC TROMETHAMINE 30 MG/ML IM SOLN
60.0000 mg | INTRAMUSCULAR | Status: AC
  Administered 2011-03-23: 60 mg via INTRAMUSCULAR

## 2011-03-23 NOTE — Patient Instructions (Addendum)
You are being treated for sinusitis and bronchitis.  You also have significant neck pain from arthritis.  You will get a breathing treatment, and depomedrol for the bronchitis.Antibiotics for 2 weeks are being sent in also.CXR pa and lateral today.  You will get a toradol injection for neck pain.  cBC and diff, chem 7 , lipid  Today,

## 2011-03-23 NOTE — Progress Notes (Signed)
  Subjective:    Patient ID: [name], female    DOB: 27-Feb-1934, 77 y.o.   MRN: [id number]  HPI  10 day h/o head and chest congestion, with thick yellow drainage from nostrils and sputum is also thick and yellow. Chest is tight, she has pains when sh breathes in left posterior chest. She reports fatigue, and generalised pain esp left side of head an neck into the shoulder  Review of Systems  Constitutional: Positive for fever, chills, appetite change and fatigue. Negative for activity change and unexpected weight change.  HENT: Positive for congestion, rhinorrhea, neck pain, postnasal drip and sinus pressure. Negative for hearing loss, ear pain, sore throat, sneezing, trouble swallowing and neck stiffness.   Eyes: Positive for itching. Negative for photophobia, pain, discharge, redness and visual disturbance.  Respiratory: Positive for cough, chest tightness, shortness of breath and wheezing.   Cardiovascular: Negative for chest pain, palpitations and leg swelling.  Gastrointestinal: Negative for nausea, vomiting, abdominal pain, diarrhea, constipation and blood in stool.  Genitourinary: Negative for dysuria, frequency, hematuria and flank pain.  Musculoskeletal: Negative for myalgias, back pain, joint swelling, arthralgias and gait problem.  Skin: Negative for rash and wound.  Neurological: Positive for weakness and light-headedness. Negative for dizziness, tremors, seizures, speech difficulty, numbness and headaches.  Hematological: Negative for adenopathy. Does not bruise/bleed easily.  Psychiatric/Behavioral: Negative for suicidal ideas, hallucinations, behavioral problems, confusion, sleep disturbance and decreased concentration. The patient is not nervous/anxious and is not hyperactive.        Objective:   Physical Exam  [nursing notereviewed. Constitutional: She is oriented to person, place, and time. She appears well-developed.       obese  HENT:  Head: Normocephalic.    Right Ear: External ear normal.  Left Ear: External ear normal.  Mouth/Throat: No oropharyngeal exudate.       Tympanic membranes clear,maxillary sinus tenderness, erythema and edema of nasal mucosa.  Eyes: Conjunctivae and EOM are normal. Right eye exhibits no discharge. Left eye exhibits no discharge. No scleral icterus.  Neck: No JVD present. No tracheal deviation present. No thyromegaly present.       Decreased ROM neck with spasm  Cardiovascular: Normal rate, regular rhythm, normal heart sounds and intact distal pulses.   No murmur heard. Pulmonary/Chest: Effort normal. No stridor. No respiratory distress. She has wheezes. She has rales. She exhibits no tenderness.       Decreased breath sounds  Abdominal: Soft. Bowel sounds are normal. There is no tenderness. There is no rebound and no guarding.  Musculoskeletal: She exhibits no edema.       Decreased ROM spine  Lymphadenopathy:    She has cervical adenopathy.  Neurological: She is alert and oriented to person, place, and time. No cranial nerve deficit. Coordination normal.  Skin: Skin is warm and dry. No rash noted. No erythema.  Psychiatric: She has a normal mood and affect. Her behavior is normal. Judgment and thought content normal.          Assessment & Plan:  Bronchitis.cXR ordered,and  Neb treatment administered., and meds prescribed. Neck pain : deteriorated, injections administered at OV 3. Sinusitis, meds prescribed. Hyperlipidemia; Hyperlipidemia:Low fat diet discussed and encouraged. Labs to be checked today.

## 2011-03-24 LAB — CBC WITH DIFFERENTIAL/PLATELET
Basophils Absolute: 0 10*3/uL (ref 0.0–0.1)
Basophils Relative: 0 % (ref 0–1)
Eosinophils Relative: 2 % (ref 0–5)
HCT: 45 % (ref 36.0–46.0)
Lymphocytes Relative: 32 % (ref 12–46)
MCHC: 31.3 g/dL (ref 30.0–36.0)
MCV: 85.6 fL (ref 78.0–100.0)
Monocytes Absolute: 1.2 10*3/uL — ABNORMAL HIGH (ref 0.1–1.0)
Monocytes Relative: 12 % (ref 3–12)
RDW: 15.1 % (ref 11.5–15.5)

## 2011-03-24 LAB — LIPID PANEL
HDL: 55 mg/dL (ref >39)
LDL Cholesterol: 104 mg/dL — ABNORMAL HIGH (ref 0–99)
VLDL: 22 mg/dL (ref 0–40)

## 2011-03-24 LAB — BASIC METABOLIC PANEL
CO2: 24 mEq/L (ref 19–32)
Glucose, Bld: 108 mg/dL — ABNORMAL HIGH (ref 70–99)
Potassium: 4.4 mEq/L (ref 3.5–5.3)
Sodium: 143 mEq/L (ref 135–145)

## 2011-03-26 ENCOUNTER — Emergency Department (HOSPITAL_COMMUNITY): Payer: Medicare Other

## 2011-03-26 ENCOUNTER — Emergency Department (HOSPITAL_COMMUNITY)
Admission: EM | Admit: 2011-03-26 | Discharge: 2011-03-26 | Disposition: A | Discharge status: Home / Self Care | Payer: Medicare Other | Attending: Emergency Medicine | Admitting: Emergency Medicine

## 2011-03-26 DIAGNOSIS — R05 Cough: Secondary | ICD-10-CM | POA: Insufficient documentation

## 2011-03-26 DIAGNOSIS — R059 Cough, unspecified: Secondary | ICD-10-CM | POA: Insufficient documentation

## 2011-03-26 DIAGNOSIS — J019 Acute sinusitis, unspecified: Secondary | ICD-10-CM | POA: Insufficient documentation

## 2011-03-26 LAB — HEMOGLOBIN A1C: Hgb A1c MFr Bld: 6.1 % — ABNORMAL HIGH (ref <5.7)

## 2011-03-28 ENCOUNTER — Encounter: Payer: Self-pay | Admitting: Family Medicine

## 2011-03-29 ENCOUNTER — Telehealth: Payer: Self-pay | Admitting: *Deleted

## 2011-03-29 NOTE — Telephone Encounter (Signed)
Message copied by Adella Hare on Mon Mar 29, 2011  9:13 AM ------      Message from: Syliva Overman      Created: Fri Mar 26, 2011  9:02 PM       Please advise the patient that their blood sugars are higher than normal.             A normal HbA1C is less than 5.7, please give them their value.            They need to cut back on carb's and sweets, start regular exercise, target at least 30 minutes 5 days a weekly and aim for weight loss.            This will reduce the risk of becoming diabetic or delayed development.

## 2011-03-31 ENCOUNTER — Other Ambulatory Visit (INDEPENDENT_AMBULATORY_CARE_PROVIDER_SITE_OTHER): Payer: Medicare Other | Admitting: *Deleted

## 2011-03-31 DIAGNOSIS — J209 Acute bronchitis, unspecified: Secondary | ICD-10-CM

## 2011-03-31 MED ORDER — BENZONATATE 100 MG PO CAPS: 100.0000 mg | ORAL_CAPSULE | Freq: Three times a day (TID) | ORAL | Status: DC | PRN

## 2011-04-07 LAB — BASIC METABOLIC PANEL
Calcium: 9.5 mg/dL (ref 8.4–10.5)
Creatinine, Ser: 0.62 mg/dL (ref 0.4–1.2)
GFR calc non Af Amer: >60 mL/min (ref >60)
Glucose, Bld: 93 mg/dL (ref 70–99)
Sodium: 138 mEq/L (ref 135–145)

## 2011-04-22 ENCOUNTER — Emergency Department (HOSPITAL_COMMUNITY): Payer: Medicare Other

## 2011-04-22 ENCOUNTER — Emergency Department (HOSPITAL_COMMUNITY)
Admission: EM | Admit: 2011-04-22 | Discharge: 2011-04-23 | Disposition: A | Discharge status: Other Acute Inpatient Hospital | Payer: Medicare Other | Source: Home / Self Care | Attending: Emergency Medicine | Admitting: Emergency Medicine

## 2011-04-22 LAB — CBC
HCT: 43.8 % (ref 36.0–46.0)
Hemoglobin: 13.7 g/dL (ref 12.0–15.0)
MCH: 26.7 pg (ref 26.0–34.0)
MCHC: 31.3 g/dL (ref 30.0–36.0)
RBC: 5.14 MIL/uL — ABNORMAL HIGH (ref 3.87–5.11)

## 2011-04-22 LAB — BASIC METABOLIC PANEL
CO2: 24 mEq/L (ref 19–32)
Chloride: 104 mEq/L (ref 96–112)
Creatinine, Ser: 0.71 mg/dL (ref 0.4–1.2)
GFR calc Af Amer: >60 mL/min (ref >60)
Potassium: 3.8 mEq/L (ref 3.5–5.1)
Sodium: 137 mEq/L (ref 135–145)

## 2011-04-22 LAB — POCT CARDIAC MARKERS
Troponin i, poc: <0.05 ng/mL (ref 0.00–0.09)
Troponin i, poc: <0.05 ng/mL (ref 0.00–0.09)

## 2011-04-22 LAB — URINALYSIS, ROUTINE W REFLEX MICROSCOPIC
Glucose, UA: NEGATIVE mg/dL
Hgb urine dipstick: NEGATIVE
Ketones, ur: NEGATIVE mg/dL
Leukocytes, UA: NEGATIVE
Protein, ur: 100 mg/dL — AB
pH: 6.5 (ref 5.0–8.0)

## 2011-04-22 LAB — DIFFERENTIAL
Lymphocytes Relative: 18 % (ref 12–46)
Lymphs Abs: 1.9 10*3/uL (ref 0.7–4.0)
Monocytes Absolute: 0.8 10*3/uL (ref 0.1–1.0)
Monocytes Relative: 7 % (ref 3–12)
Neutro Abs: 8 10*3/uL — ABNORMAL HIGH (ref 1.7–7.7)
Neutrophils Relative %: 74 % (ref 43–77)

## 2011-04-22 LAB — URINE MICROSCOPIC-ADD ON

## 2011-04-22 LAB — TROPONIN I: Troponin I: 0.02 ng/mL (ref 0.00–0.06)

## 2011-04-22 LAB — CK TOTAL AND CKMB (NOT AT ARMC)
Relative Index: 9.2 — ABNORMAL HIGH (ref 0.0–2.5)
Total CK: 458 U/L — ABNORMAL HIGH (ref 7–177)

## 2011-04-22 LAB — CK: Total CK: 458 U/L — ABNORMAL HIGH (ref 7–177)

## 2011-04-23 ENCOUNTER — Observation Stay (HOSPITAL_COMMUNITY)
Admission: AD | Admit: 2011-04-23 | Discharge: 2011-04-24 | Disposition: A | Discharge status: Home / Self Care | Payer: Medicare Other | Source: Other Acute Inpatient Hospital | Attending: Cardiology | Admitting: Cardiology

## 2011-04-23 DIAGNOSIS — E7800 Pure hypercholesterolemia, unspecified: Secondary | ICD-10-CM | POA: Insufficient documentation

## 2011-04-23 DIAGNOSIS — M1990 Unspecified osteoarthritis, unspecified site: Secondary | ICD-10-CM | POA: Insufficient documentation

## 2011-04-23 DIAGNOSIS — R079 Chest pain, unspecified: Principal | ICD-10-CM | POA: Insufficient documentation

## 2011-04-23 DIAGNOSIS — Z87891 Personal history of nicotine dependence: Secondary | ICD-10-CM | POA: Insufficient documentation

## 2011-04-23 DIAGNOSIS — I2510 Atherosclerotic heart disease of native coronary artery without angina pectoris: Secondary | ICD-10-CM | POA: Insufficient documentation

## 2011-04-23 DIAGNOSIS — R42 Dizziness and giddiness: Secondary | ICD-10-CM | POA: Insufficient documentation

## 2011-04-23 DIAGNOSIS — R0602 Shortness of breath: Secondary | ICD-10-CM | POA: Insufficient documentation

## 2011-04-23 DIAGNOSIS — I10 Essential (primary) hypertension: Secondary | ICD-10-CM | POA: Insufficient documentation

## 2011-04-23 LAB — BASIC METABOLIC PANEL
BUN: 10 mg/dL (ref 6–23)
Calcium: 8.7 mg/dL (ref 8.4–10.5)
Creatinine, Ser: 0.74 mg/dL (ref 0.4–1.2)
GFR calc Af Amer: >60 mL/min (ref >60)
GFR calc non Af Amer: >60 mL/min (ref >60)

## 2011-04-23 LAB — POCT ACTIVATED CLOTTING TIME: Activated Clotting Time: 122 seconds

## 2011-04-23 LAB — LIPID PANEL
Cholesterol: 197 mg/dL (ref 0–200)
LDL Cholesterol: 114 mg/dL — ABNORMAL HIGH (ref 0–99)

## 2011-04-23 LAB — CARDIAC PANEL(CRET KIN+CKTOT+MB+TROPI)
CK, MB: 20.1 ng/mL (ref 0.3–4.0)
CK, MB: 2 ng/mL (ref 0.3–4.0)
CK, MB: 7.3 ng/mL (ref 0.3–4.0)
Relative Index: 5.9 — ABNORMAL HIGH (ref 0.0–2.5)
Relative Index: 3.6 — ABNORMAL HIGH (ref 0.0–2.5)
Total CK: 339 U/L — ABNORMAL HIGH (ref 7–177)
Total CK: 201 U/L — ABNORMAL HIGH (ref 7–177)
Troponin I: 0.01 ng/mL (ref 0.00–0.06)

## 2011-04-23 LAB — CBC
MCH: 26.4 pg (ref 26.0–34.0)
MCV: 85.1 fL (ref 78.0–100.0)
Platelets: 370 10*3/uL (ref 150–400)
RDW: 15 % (ref 11.5–15.5)

## 2011-04-23 LAB — SEDIMENTATION RATE: Sed Rate: 6 mm/hr (ref 0–22)

## 2011-04-23 LAB — HEPARIN LEVEL (UNFRACTIONATED): Heparin Unfractionated: 0.2 IU/mL — ABNORMAL LOW (ref 0.30–0.70)

## 2011-04-24 LAB — CBC
HCT: 37.5 % (ref 36.0–46.0)
MCV: 84.7 fL (ref 78.0–100.0)
RDW: 14.9 % (ref 11.5–15.5)
WBC: 11.2 10*3/uL — ABNORMAL HIGH (ref 4.0–10.5)

## 2011-04-24 LAB — BASIC METABOLIC PANEL
BUN: 11 mg/dL (ref 6–23)
Calcium: 8.8 mg/dL (ref 8.4–10.5)
Chloride: 107 mEq/L (ref 96–112)
Creatinine, Ser: 0.79 mg/dL (ref 0.4–1.2)

## 2011-04-24 LAB — CARDIAC PANEL(CRET KIN+CKTOT+MB+TROPI): Troponin I: 0.01 ng/mL (ref 0.00–0.06)

## 2011-04-26 ENCOUNTER — Telehealth: Payer: Self-pay | Admitting: Family Medicine

## 2011-04-26 ENCOUNTER — Other Ambulatory Visit (INDEPENDENT_AMBULATORY_CARE_PROVIDER_SITE_OTHER): Payer: Medicare Other | Admitting: *Deleted

## 2011-04-26 DIAGNOSIS — F329 Major depressive disorder, single episode, unspecified: Secondary | ICD-10-CM

## 2011-04-26 DIAGNOSIS — F411 Generalized anxiety disorder: Secondary | ICD-10-CM

## 2011-04-26 DIAGNOSIS — M5126 Other intervertebral disc displacement, lumbar region: Secondary | ICD-10-CM

## 2011-04-26 MED ORDER — ALPRAZOLAM 0.25 MG PO TABS: 0.2500 mg | ORAL_TABLET | Freq: Every day | ORAL | Status: DC

## 2011-04-26 MED ORDER — SERTRALINE HCL 25 MG PO TABS: 25.0000 mg | ORAL_TABLET | Freq: Every day | ORAL | Status: DC

## 2011-04-26 MED ORDER — HYDROCODONE-ACETAMINOPHEN 5-500 MG PO TABS: 1.0000 | ORAL_TABLET | Freq: Two times a day (BID) | ORAL | Status: DC | PRN

## 2011-04-26 MED ORDER — CALCIUM CARBONATE-VITAMIN D 500-200 MG-UNIT PO TABS: 1.0000 | ORAL_TABLET | Freq: Three times a day (TID) | ORAL | Status: DC

## 2011-04-26 NOTE — Telephone Encounter (Signed)
meds sent

## 2011-04-26 NOTE — Telephone Encounter (Signed)
pls  Advise pt to ask the agency she is requesting to send the request. Let her know I see where she had a nl cardiac cath, and hope she starts feeling better soonm

## 2011-04-27 NOTE — Telephone Encounter (Signed)
Patient aware.

## 2011-04-29 NOTE — Cardiovascular Report (Signed)
[name], [name]              ACCOUNT NO.:  [id number]  MEDICAL RECORD NO.:  [id number]           PATIENT TYPE:  O  LOCATION:  6526                         FACILITY:  [facility]  PHYSICIAN:  Neils Siracusa N. Sharyn Lull, [name] DATE OF BIRTH:  [birth year]  DATE OF PROCEDURE:  04/23/2011 DATE OF DISCHARGE:  04/24/2011                           CARDIAC CATHETERIZATION   PROCEDURES: 1. Left cardiac cath with selective left and right coronary     angiography. 2. Left ventriculography the via right groin using Judkins technique.  INDICATIONS FOR PROCEDURE:  [name] is a 77 year old black female with past medical history significant for degenerative joint disease, tobacco abuse, morbid obesity.  She was transferred from [facility] as the patient complained of vague retrosternal chest pain off and on associated with nausea, dizziness, and was noted to have elevated CPK-MB with normal troponin I.  The patient denies any exertional chest pain although her activity is limited due to back and knee problems.  Denies any weakness in arms or legs.  Denies palpitation, lightheadedness, or syncope.  Denies fever or chills. Denies any thyroid problems.  Denies any muscular disease in the past. Denies any cardiac workup in the past.  The patient states she has been healthy all her life except for her back, herniated disk, and knee problems.  Her cardiac markers, CK-MB point of care was 30.7, repeat was 46.1, third set was 42.1, although troponin I 2 sets were negative. Repeat CK by lab, CPK was 458, MB of 42.9 with relative index of 9.1. EKG showed normal sinus rhythm with left atrial enlargement and nonspecific T-wave changes.  Due to vague chest pain and elevated CPK- MBs, it was discussed with the patient regarding left cath, possible PTCA stenting, its risks and benefits i.e. death, MI, stroke, need for emergency CABG, risk of restenosis, local vascular complications, etc., and consented for  the procedure.  DESCRIPTION OF PROCEDURE:  After obtaining the informed consent, the patient was brought to the cath lab and was placed on the fluoroscopy table.  Right groin was prepped and draped in usual fashion.  A 1% Xylocaine was used for local anesthesia in the right groin.  With the help of thin-wall needle, 6-French arterial sheath was placed.  The sheath was aspirated and flushed.  Next, 6-French left Judkins catheter was advanced over the wire under fluoroscopic guidance up to the ascending aorta.  Wire was pulled out.  The catheter was aspirated and connected to the manifold.  Catheter was further advanced and engaged into left coronary ostium.  Multiple views of the left system were taken.  Next, catheter was disengaged and was pulled out over the wire and was replaced with 6-French right Judkins catheter which was advanced over the wire under fluoroscopic guidance up to the ascending aorta. Wire was pulled out.  The catheter was aspirated and connected to the manifold.  Catheter was further advanced and engaged into right coronary ostium.  Multiple views of the right system were taken.  Next, the catheter was disengaged and was pulled out over the wire and was replaced with 6-French pigtail catheter which was advanced  over the wire under fluoroscopic guidance up to the ascending aorta.  Wire was pulled out.  The catheter was aspirated and connected to the manifold. Catheter was further advanced across the aortic valve into the LV.  LV pressures were recorded.  Next, LV graft was done in 30-degree RAO position.  Post-angiographic pressures were recorded from LV and then pullback pressures were recorded from the aorta.  There was no gradient across the aortic valve.  Next, pigtail catheter was pulled out over the wire.  Sheaths were aspirated and flushed.  FINDINGS:  LV showed good LV systolic function, LVH, EF of 40-98%.  Left main was patent.  LAD has 10-15% proximal and  20-25% mid and 30-40% distal focal stenosis.  Diagonal 1 has 20-25% ostial stenosis.  Diagonal 2 is very small.  Left circumflex has 20-25% proximal and mid stenosis. Left circumflex is small and tapers down in AV groove.  OM-1 is very, very small.  OM-2 is small which is patent. RCA has 20-25% mid stenosis.  PDA and PLV branches were patent.  The arteriotomy was closed with ProGlide without any complications.  The patient tolerated the procedure well and was transferred to recovery room in stable condition.     [name]. Sharyn Lull, [name]     [facility]  D:  04/24/2011  T:  04/24/2011  Job:  [id number]  Electronically Signed by Rinaldo Cloud [name] on 04/29/2011 08:36:02 AM

## 2011-04-29 NOTE — Discharge Summary (Signed)
NAMESAMIKSHA, [name]              ACCOUNT NO.:  [id number]  MEDICAL RECORD NO.:  [id number]           PATIENT TYPE:  O  LOCATION:  6526                         FACILITY:  [facility]  PHYSICIAN:  Kristian Mogg N. Sharyn Lull, [name] DATE OF BIRTH:  [birth year]  DATE OF ADMISSION:  04/23/2011 DATE OF DISCHARGE:  04/24/2011                              DISCHARGE SUMMARY   ADMITTING DIAGNOSES:  Chest pain, rule out myocardial infarction, elevated CPK-MB probably secondary to noncardiac source, rule out polymyositis, rule out hypothyroidism, morbid obesity, history of tobacco abuse.  FINAL DIAGNOSES:  Status post chest pain, myocardial infarction ruled out, mild coronary artery disease status post left cath, elevated CPK-MB secondary to noncardiac source, hypertension, hypercholesteremia, morbid obesity, history of tobacco abuse in the past, degenerative joint disease, status post vertigo.  DISCHARGE HOME MEDICATIONS: 1. Enteric-coated aspirin 81 mg 1 tablet daily. 2. Metoprolol 12.5 mg twice daily. 3. Pepcid 20 mg twice daily. 4. Hydrocodone/APAP 5/325 mg 1 tablet every 8 hours as needed for     pain. 5. Meclizine 25 mg 1 tablet 3 times daily as needed for dizziness. 6. Atorvastatin 10 mg 1 tablet daily.  DIET:  Low salt, low cholesterol.  ACTIVITY:  Increase activity slowly as tolerated.  Postcardiac cath instructions have been given.  Follow up with me in 1 week.  The patient has been advised to reduce weight and cut down on her calorie intake.  CONDITION AT DISCHARGE:  Stable.  BRIEF HISTORY AND HOSPITAL COURSE:  [name] is a 77 year old black female with past medical history significant for degenerative joint disease, history of tobacco abuse, morbid obesity was transferred from [facility] ER.  The patient complained of vague retrosternal chest pain off and on associated with nausea and dizziness and was noted to have elevated CPK-MB with normal troponin I.  The patient denies  any exertional chest pain, although her activity is limited due to back and knee problems.  Denies any weakness in the arms or legs.  Denies palpitation, lightheadedness, or syncope.  Denies fever or chills. Denies any thyroid problems.  Denies any muscular disease.  Denies any cardiac workup in the past.  States she has been healthy all her life except for back herniated disk and knee problems.  PAST MEDICAL HISTORY:  As above.  PAST SURGICAL HISTORY:  She had back surgery in the past, had right total knee replacement, and cataract surgery in the past.  ALLERGIES:  She is allergic to PENICILLIN.  MEDICATION AT HOME:  She is on hydrocodone, she takes as needed.  SOCIAL HISTORY:  She is widowed, retired, worked in the office in the past.  Smoked half pack per day for 30+ years, quit 20 years ago.  No history of alcohol abuse.  FAMILY HISTORY:  Father died of MI at the age of 59.  Mother is alive. She is in her 90s.  She is diabetic.  She has breast cancer.  One brother died of stroke in his 50s.  PHYSICAL EXAMINATION:  GENERAL:  She is alert, awake, oriented x3 in no acute distress. VITAL SIGNS:  Blood pressure was 144/81, pulse was  78 and regular. HEENT:  Conjunctivae was pink. NECK:  Supple, no JVD, no bruit. LUNGS:  Clear to auscultation without rhonchi or rales. CARDIOVASCULAR:  S1, S2 normal.  There was soft systolic murmur.  There was no rub or S3 or S4 gallop. ABDOMEN:  Soft, obese, nontender. EXTREMITIES:  There is no clubbing, cyanosis, or edema.  LABORATORY DATA:  Hemoglobin was 13.7, hematocrit 43.8, white count of 10.8.  Her sodium was 137, potassium 3.8, glucose 103, BUN 14, creatinine 0.71.  Repeat glucose fasting was 97.  Her point of care markers, CK-MB was 46.1.  Troponin I was less than 0.05.  Repeat CK-MB was 30.7.  Troponin I was less than 0.05.  By labs her troponin's were also negative 0.02, total CK was 458, MB 42.8, relative index 9.2 which was  positive.  Repeat CK by lab 339, MB 20.1, relative index 5.9 which was also strongly positive, although troponin-I was 0.02.  Last night CK was 89, MB 2.0, troponin-I less than 0.1.  This month's his last set, CK 201, MB 7.3, relative index 3.6, troponin I remained less than 0.01. Her rheumatoid factor was less than 10.  TSH was normal 0.95.  Her set rate was 6 which was all normal.  Cholesterol was 197, LDL 114, HDL 54, triglycerides were 145.  MRSA PCR screening was negative.  Urinalysis was negative.  She had CT of the brain done at [facility] which showed no acute finding, atrophy with chronic microvascular ischemic changes. Chest x-ray done also at [facility] shows tortuous thoracic aorta, otherwise no significant abnormality.  BRIEF HOSPITAL COURSE:  The patient was admitted to step-down unit and was transferred from [facility] ER to [facility]  The patient subsequently underwent cardiac cath with selective left and right coronary angiography.  As per procedure report, the patient tolerated procedure well.  There were no complications.  The patient did not have any further episodes of chest pain during the hospital stay.  Her groin is stable with no evidence of hematoma or bruit.  The patient has been ambulating in hallway without any problems.  Her dizziness has almost resolved with meclizine.  The patient will be discharged home on above medications and will be followed up in my office in 1 week.     [name], [name]     MNH/MEDQ  D:  04/24/2011  T:  04/24/2011  Job:  [id number]  Electronically Signed by Rinaldo Cloud [name] on 04/29/2011 08:36:05 AM

## 2011-05-11 NOTE — Op Note (Signed)
[name], [name] NO.:  [id number]   MEDICAL RECORD NO.:  [id number]          PATIENT TYPE:  INP   LOCATION:  3534                         FACILITY:  [facility]   PHYSICIAN:  Tia Alert, MD     DATE OF BIRTH:  10/11/1934   DATE OF PROCEDURE:  04/11/2008  DATE OF DISCHARGE:                               OPERATIVE REPORT   PREOPERATIVE DIAGNOSIS:  Left herniated disk L5-S1 with the left L5  radiculopathy.   POSTOPERATIVE DIAGNOSIS:  Left herniated disk L5-S1 with the left L5  radiculopathy.   PROCEDURE:  Left lumbar hemilaminectomy, medial facetectomy, and  foraminotomy at L5-S1 followed with the decompression of the L5 and the  S1 nerve roots with microdiskectomy at L5-S1 on the left side utilizing  microscopic dissection.   SURGEON:  Tia Alert, MD   ASSISTANT:  Reinaldo Meeker, MD   ANESTHESIA:  General endotracheal.   COMPLICATIONS:  None apparent.   INDICATIONS FOR PROCEDURE:  [name] is a 77 year old female who is  referred with severe leg pain in the left leg to follow an L5  distribution.  She had an MRI, which showed extraforaminal disk  herniation at L5-S1 on the left side with decompression of left L5 nerve  root.  I recommended extraforaminal diskectomy L5-S1 on the left side.  She understood the risks, benefits, alternatives, extensive outcome, and  wished to proceed.  During the procedure, we found that the patient's  body habitus precluded as we are unable to  do an extraforaminal  diskectomy, but we were able to reach the disk from a more midline  approach sweeping the disk down with a nerve hook under the L5 nerve  root.   DESCRIPTION OF PROCEDURE:  The patient was taken to operating room after  induction of adequate generalized endotracheal anesthesia, she was  rolled into the prone position on the Wilson frame.  All pressure points  were padded.  Her lumbar region was prepped and draped in usual sterile  fashion.  A 5 mL  local anesthesia was injected and a dorsal midline  incision was made and carried down to the lumbosacral fascia.  The  fascia was opened and the paraspinous musculature was taken down in  subperiosteal fashion to expose L5-S1 on the left side.  Intraoperative  x-ray confirmed level and then we used a Kerrison punch and high speed  drill to perform a hemilaminectomy, medial facetectomy, and foraminotomy  at L5-S1 on the left side.  I carried the laminectomy all the way  through the lamina on the left side and then removed the yellow ligament  in the lateral recess all the way up to the shoulder.  I followed the L5  nerve root down marching along the pedicle and out into the foramen.  I  widened the foraminotomy and undercut the lateral recess in order to  identify the disk space.  The S1 nerve root and the L5 nerve root.  I  had a nice bony decompression into nerve roots.  I was unable to incise  the disk space utilizing microscopic scalpel  dissection and performed  initial diskectomy with pituitary rongeurs.  We then used a nerve hook  swept up under the L5 nerve root and I was able to sweep a large disk  fragment down from the extraforaminal space and removed with pituitary  rongeur.  Once the diskectomies was complete, we palpated with a  coronary dilator and felt like we had excellent decompression of the L5  nerve root.  From this approach, we felt no more compression of the  root.  We irrigated with saline solution containing bacitracin, dried  all bleeding points along the dura with Duragen and with Gelfoam and  then closed the fascia with interrupted 0 Vicryl, closed the  subcutaneous and subcuticular tissue with 2-0 and 3-0 Vicryl, closed the  skin with Benzoin and Steri-Strips.  The drapes removed.  Sterile  dressing was applied.  The patient was awakened from general anesthesia  and transferred recovery room in stable condition.  At the end of the  procedure all sponge, needle,  and sponge counts were correct.      Tia Alert, MD  Electronically Signed     DSJ/MEDQ  D:  04/11/2008  T:  04/12/2008  Job:  [id number]

## 2011-05-14 NOTE — Group Therapy Note (Signed)
[name], [name]              ACCOUNT NO.:  [id number]   MEDICAL RECORD NO.:  [id number]          PATIENT TYPE:  INP   LOCATION:  A226                          FACILITY:  [facility]   PHYSICIAN:  Osvaldo Shipper, MD     DATE OF BIRTH:  [birth year]   DATE OF PROCEDURE:  DATE OF DISCHARGE:                                   PROGRESS NOTE   Please see my consultation dictated on May 31, 2005, for details regarding  the patient's problems.   I have written a detailed note in the chart today also.  This is a note to  be forwarded to the patient's primary medical doctor regarding the workup  the patient has had for her syncopal episode.   SUBJECTIVE:  This morning, the patient is complaining of constipation.  She also had one  episode of a dizzy spell this morning when she was lying in the bed and she  felt lightheaded.  She did not feel that the room was spinning around.  She  did not make any sudden moves.  No episodes of chest pain, shortness of  breath or palpitations were present with this episode.  There was no history  of any focal weakness also present.   OBJECTIVE:  Vital signs were all stable.  Examination findings were also stable.  Telemetry did not show any adverse event either during that episode or in  the past two days as well.   Her laboratory work shows mild hypokalemia, otherwise unremarkable today.   ASSESSMENT AND PLAN:  Syncopal episode.  The patient is a 77 year old  African American female who presented to the hospital after she bent over,  felt dizzy, lost consciousness and broke her right patella after a fall.  The patient has undergone an open treatment and internal reduction of her  right patellar fracture by Dr. Hilda Lias.   As for the etiology of her syncope, the patient has undergone extensive  workup during this hospital stay.  As a preoperative evaluation, the patient  underwent a stress echocardiogram which was essentially negative.  The  patient did  have Q waves in her inferior leads on the EKG which prompted  this evaluation.  Subsequently the patient was put on telemetry which also  has not shown any adverse arrhythmic events.   Subsequently we also obtained an MRI of her brain, including an MRA, all of  which have not shown any acute event.  No evidence for any acoustic neuroma  was also found.  MRI of the brain showed atrophy and chronic small vessel  ischemic changes.  MRA of the brain showed mild irregularity in the right  middle cerebral artery branches likely due to atherosclerotic disease,  however, no aneurysm was seen and no significant disease was seen.  MRA of  the neck was also negative.   I have made an appointment to see Dr. Lazarus Salines, the ENT specialist, as an  outpatient and this has to be made in the next two to three weeks. His phone  number is [phone].  I have written an order to schedule the same.  This  probably needs to be followed up by the primary medical doctor.   The patient might also benefit from a tilt table test to evaluate the cause  of her dizzy spells if the ENT evaluation proves negative.  The patient may  also benefit from baby aspirin considering atherosclerotic disease seen on  her MRA.  The patient may also benefit from a statin, which I would defer to  her primary doctor at this time.   Her other medical conditions included hypertension, which has remained  optimally controlled during this admission.  I have added metoprolol to this  patient's medical regimen as a part of her preoperative evaluation.   The patient will be followed by Dr. Mikeal Hawthorne for the next two to three days  until the patient is discharged.       GK/MEDQ  D:  06/05/2005  T:  06/05/2005  Job:  [id number]   cc:   Milus Mallick. Lodema Hong, [name]  [address]  [place], [place] [address]  Fax: [phone]   Lonia Blood, [name]   Teola Bradley, [name]  [phone] [address] [place]  [place] [address]  Fax: [phone]

## 2011-05-14 NOTE — Procedures (Signed)
[name], [name]              ACCOUNT NO.:  [id number]   MEDICAL RECORD NO.:  [id number]          PATIENT TYPE:  INP   LOCATION:  A330                          FACILITY:  [facility]   PHYSICIAN:  [place] Bing, [name]  DATE OF BIRTH:  [birth year]   DATE OF PROCEDURE:  06/01/2005  DATE OF DISCHARGE:                                  ECHOCARDIOGRAM   REFERRING PHYSICIAN:  Dr. Hilda Lias and Dr. Dietrich Pates   CLINICAL DATA:  A 78 year old woman with patellar fracture; preoperative  study due to EKG evidence for possible prior inferior myocardial infarction  and intermittent chest pain.   1.  Progressive dobutamine infusion to a heart rate of 157, 104% of age-      predicted maximum.  Peak infusion rate was 30 mcg/kg/minute.  The      patient described mild flushing and nausea.  2.  No significant arrhythmias, few PVCs.  3.  Mildly hypertensive response with a maximal blood pressure of 170/80 at      peak drug infusion.    Baseline EKG:  Sinus tachycardia; nondiagnostic inferior Q-waves; abnormal R-  wave progression; nonspecific ST-T wave abnormality.   Stress EKG:  Insignificant upsloping ST-segment depression.   Resting echocardiogram:  Technically difficult; normal chamber dimensions;  right ventricular hypertrophy with normal systolic function; mild to  moderate left ventricular hypertrophy most prominent in the septum;  hyperdynamic regional and global function.  Normal mitral valve; mild aortic  valvular sclerosis and calcification of the proximal ascending aorta.   Echocardiogram during pharmacologic stress:  Even more hyperdynamic in all  regions.   IMPRESSION:  Negative dobutamine stress echocardiogram revealing no stress-  induced EKG abnormalities, no important structural cardiac abnormalities,  and no echocardiographic evidence for myocardial ischemia or infarction.  Other findings as noted.       RR/MEDQ  D:  06/02/2005  T:  06/02/2005  Job:  [id number]

## 2011-05-14 NOTE — Op Note (Signed)
[name], [name]              ACCOUNT NO.:  [id number]   MEDICAL RECORD NO.:  [id number]          PATIENT TYPE:  INP   LOCATION:  A330                          FACILITY:  [facility]   PHYSICIAN:  J. Darreld Mclean, [name] DATE OF BIRTH:  Aug 27, 1934   DATE OF PROCEDURE:  DATE OF DISCHARGE:                                 OPERATIVE REPORT   PREOPERATIVE DIAGNOSIS:  Fracture of right patella, displaced.   POSTOPERATIVE DIAGNOSIS:  Fracture of right patella, displaced.   PROCEDURES:  Open treatment and internal reduction of right patellar  fracture using figure-of-eight wire.   SURGEON:  J. Darreld Mclean, [name]   ASSISTANT:  Lolita Cram, P.A.   ANESTHESIA:  General.   TOURNIQUET TIME:  Please refer to anesthesia record.   INDICATION FOR PHYSICIAN ASSISTANT:  Medically necessary because there is no  house staff here to help with the patient.  We are a small community  hospital and the use of physician assistant is medically indicated necessary  to help reduce the fracture and maintain it in position while I applied the  tension band wiring.   INDICATION FOR PROCEDURE:  The patient is a 77 year old female who fell and  injured her right knee.  She has a displaced fracture of the right patella.  No other apparent injuries.  The surgery was delayed because of a heart  condition and she has never been cleared by cardiology.  Risks and  imponderables were discussed.  She is at high risk.  She appears to  understand and agreed to procedure as outlined with her husband.   DESCRIPTION OF PROCEDURE:  The patient was seen in the holding area and  identified the right knee as the correct surgical site.  She placed initial  as I did.  She was brought back to the operating room.  Attempt at spinal  was unsuccessful and she was given general anesthesia.  The tourniquet was  placed deflated on the right upper thigh.  She was prepped and draped in the  usual manner.  I again identified the patient  as [name] and doing the  right knee.  The leg was elevated and wrapped circumferentially with an  Esmarch bandage.  The tourniquet was inflated to 300 mmHg.  The Esmarch  bandage was removed.  Incision was made.  The fracture site was identified.  It was removed of a large amount of hematoma and was irrigated.  The  fracture fragments were anatomically reduced and held in place.  Then two  smooth 0.625 Kirschner wires were placed.  Figure-of-eight wire banding was  then done and tightened.  Then the ends of the wire were cut, retracted and  bent forward over the wire.  X-rays in [facility] and lateral views showed the  reduction being anatomic and in good position and alignment.  The fascial  layer was reapproximated on each side using #1 Surgilon suture, interrupted  figure-of-eight.  The wound was then  reapproximated using 2-0 chromic plain and skin staples.  Sterile dressing  applied.  Bulky dressing applied.  Tourniquet deflated in the usual fashion.  Please  see the anesthesia record for tourniquet time.  The patient was  admitted.      JWK/MEDQ  D:  06/02/2005  T:  06/02/2005  Job:  [id number]

## 2011-05-14 NOTE — H&P (Signed)
[name], [name]              ACCOUNT NO.:  [id number]   MEDICAL RECORD NO.:  [id number]          PATIENT TYPE:  AMB   LOCATION:  DAY                           FACILITY:  [facility]   PHYSICIAN:  Jerolyn Shin C. Katrinka Blazing, [name]   DATE OF BIRTH:  1934/06/05   DATE OF ADMISSION:  DATE OF DISCHARGE:  LH                                HISTORY & PHYSICAL   HISTORY OF PRESENT ILLNESS:  A 77 year old female referred for evaluation of  guaiac positive stool.  The patient noticed blood in her stools 2 weeks ago,  and also noted blood after bowel movements.  Stool guaiacs were positive.  She has not had any rectal pain.  She has not noted any bright red blood in  the commode.  She is scheduled for a colonoscopy.   PAST HISTORY:  1.  Hypertension.  2.  Hyperlipidemia.   MEDICATIONS:  Not given.   SURGERY:  Hysterectomy.   ALLERGIES:  PENICILLIN.   PHYSICAL EXAMINATION:  VITAL SIGNS:  Blood pressure 156/94, pulse 74,  respirations 20, weight 224 pounds.  HEENT:  Unremarkable.  NECK:  Supple.  No JVD, bruit, adenopathy, or thyromegaly.  CHEST:  Clear to auscultation.  HEART:  Regular rate and rhythm without murmur, gallop, or rub.  ABDOMEN:  Obese, soft, nontender.  No masses.  EXTREMITIES:  No clubbing, cyanosis, or edema.  NEUROLOGIC:  No focal motor, sensory, or cerebellar deficit.   IMPRESSION:  1.  Rectal bleeding with guaiac positive stool.  2.  Hypertension.  3.  Hyperlipidemia.   PLAN:  Diagnostic colonoscopy.       LCS/MEDQ  D:  05/26/2005  T:  05/26/2005  Job:  [id number]   cc:   [facility]

## 2011-05-14 NOTE — Discharge Summary (Signed)
[name], [name]              ACCOUNT NO.:  [id number]   MEDICAL RECORD NO.:  [id number]          PATIENT TYPE:  INP   LOCATION:  A226                          FACILITY:  [facility]   PHYSICIAN:  J. Darreld Mclean, [name] DATE OF BIRTH:  October 11, 1934   DATE OF ADMISSION:  05/31/2005  DATE OF DISCHARGE:  06/12/2006LH                                 DISCHARGE SUMMARY   DISCHARGE DIAGNOSES:  1.  Fracture of the patella on right.  2.  Avulsion of the left knee.  3.  Hypertension.   CONDITION ON DISCHARGE:  Improved.  Prognosis good.   DISPOSITION:  To home to be followed by home health.   FOLLOW UP:  The patient will be seen in the office on June 22.   DISCHARGE MEDICATIONS:  1.  Aspirin 81 mg daily.  2.  Tylox one q.4h. p.r.n. pain.  3.  Avapro 150 daily.  4.  Hydrochlorothiazide 12.5 mg daily.   FOLLOW UP:  The patient will be followed by [facility] in 2-3 weeks.   ACTIVITY:  Use walker.  Continue knee immobilizer.   HISTORY OF PRESENT ILLNESS:  The patient fell off her porch and injured her  right knee.  She was seen in the emergency room and sent home and was asked  to be seen by me in the office.  The patella was separated.   HOSPITAL COURSE:  The patient was admitted and seen by cardiology preop.  Surgery had to be postponed due to cardiac findings.  After the cardiology  workup was complete, she was found to be a good candidate for the procedure.  She went to surgery on June 7, when she had open treatment and internal  reduction of the patella fracture using figure-of-eight wires.  The first  day postoperatively on June 8, she was afebrile.  The pain was well-  controlled with PCA pump.  She was continued to be followed by cardiology  and was doing well.  She was also followed by Dr. Loleta Chance.  On June 9, she had  a temperature of 100 and an episode of syncope.  She was placed on 2A with  telemetry.  She did not have therapy because of this.  She had no further  episodes, no  further events and telemetry was essentially negative.  She was  seen by physical therapy.  At first she had difficulty moving about, but  then gradually progressed.  She had an MRI of her head that was negative.  On June 10, she was afebrile and she complained of constipation and enema  was given.  The patient continued to improve.  She was seen by physical  therapy and was slowly making good progress.  Foley was discontinued on June  11.  By June 12, she was doing  well, labs were normal and she was having no further syncopal episodes.  Her  heart was stable.  She was discharged home with home health.  I will see her  in the office as stated.  If she has any problems or difficulties, she can  contact me through the  hospital/office beeper system.       JWK/MEDQ  D:  07/21/2005  T:  07/21/2005  Job:  [id number]

## 2011-05-14 NOTE — Consult Note (Signed)
[name], [name]              ACCOUNT NO.:  [id number]   MEDICAL RECORD NO.:  [id number]          PATIENT TYPE:  INP   LOCATION:  A330                          FACILITY:  [facility]   PHYSICIAN:  Osvaldo Shipper, MD     DATE OF BIRTH:  1934-10-10   DATE OF CONSULTATION:  05/31/2005  DATE OF DISCHARGE:                                   CONSULTATION   REASON FOR CONSULTATION:  Preoperative evaluation.   REQUESTING PHYSICIAN:  Dr. Hilda Lias   HISTORY OF PRESENT ILLNESS:  This is a 77 year old African-American female  with a past medical history of hypertension, hypercholesterolemia, obesity  who was apparently well until last evening about 6 p.m. when she bent over  to pick an object from the floor, became dizzy, and passed out.  Patient has  history of inner ear problems and she has these dizzy spells periodically  but this is the first time that the patient actually passed out.  The  syncope lasted about a few seconds and the patient woke up and found that  her right knee was hurting her.  Patient called for help and was brought  into the emergency department.  In the ED she was found to have right  patellar fracture.  Patient is being scheduled for open treatment and  internal reduction of the right patellar fracture tomorrow.   Patient mentioned that at the time of this episode last evening she did not  have any chest discomfort.  There was no shortness of breath or chest pain  either prior to or after this episode.  Patient did not have any focal  weakness.  No history of any seizure episode.  There was no urinary or bowel  incontinence.  Patient did not give history of any palpitations or  diaphoresis.   Patient mentioned that for the past two or three years the patient has been  having retrosternal chest discomfort which she describes as a stinging kind  of pain which lasts about two to three minutes and resolves spontaneously.  Sometimes she experiences radiation of this pain to  the left arm.  The  patient could be resting or ambulating when the pain appears.  She has  mentioned this to a primary care doctor who put her on a trial of PPI which  seemed to have helped the pain initially but later on she stopped taking the  medication and her pain seemed to continue.  Patient gives history of a  stress test a few years ago which was negative.  The stress test was done  more than five years ago.   Patient has obesity and she is supposed to use a cane to ambulate at home.  However, she states that she does not move about much.  She gets short of  breath even if she walks short distances.  She has only two steps in her  house and she finds it occasionally difficult to even climb those steps.  Patient also has history of sciatica which also limits her mobility.   CURRENT MEDICATIONS:  Vytorin and Benicar/HCT.  She also takes Singulair  for  allergies.   ALLERGIES:  She is allergic to PENICILLIN which causes hives.   PAST MEDICAL HISTORY:  1.  Hypertension.  2.  High cholesterol.  3.  She specifically does not have any history of coronary artery disease,      CVA, or any lung disease that she knows of.  4.  Patient had a colonoscopy one week ago in which they found hemorrhoids.  5.  Patient also gives history of hysterectomy about 22 years ago.   SOCIAL HISTORY:  Patient lives with her husband in [place].  She quit  smoking about 15-20 years ago.  No history of alcohol use.  As mentioned  under the HPI she uses a cane to ambulate but she does not get out of the  house often.   FAMILY HISTORY:  Her father had history of high cholesterol, hypertension,  heart disease, diabetes, and arthritis.  Her mother had history of diabetes,  hypertension, some heart disease, she does not know what exactly.  Her  mother also had breast cancer.  She also has multiple siblings some of whom  have diabetes.  No history of coronary artery disease in her siblings.   REVIEW OF  SYSTEMS:  10-point review of systems was done which was negative  except as mentioned under HPI and social history.   PHYSICAL EXAMINATION:  VITAL SIGNS:  Temperature 98.3, heart rate 100,  respiratory rate 16, blood pressure 156/84, saturating 96% on room air.  GENERAL:  This is an obese African-American female, very pleasant to talk  to, in no discomfort.  HEENT:  There is no pallor, no icterus.  Oral mucous membrane is moist with  no oral lesions seen.  NECK:  Soft and supple.  LUNGS:  Clear to auscultation bilaterally.  No wheezing, rales, or rhonchi  appreciated.  CARDIOVASCULAR:  S1, S2 is normal, regular.  No murmurs appreciated.  No S3,  S4 is heard.  No JVD is seen.  No carotid bruits appreciated.  ABDOMEN:  Obese, nontender, nondistended.  Bowel sounds are present.  No  organomegaly or masses appreciated.  EXTREMITIES:  Without edema.  Peripheral pulses are palpable.  The right  lower extremity is covered in a bandage.   LABORATORY DATA:  CBC showed white count 11.5 with a normal differential,  hemoglobin 12.6, MCV 81, platelet count 391.  PT/INR within normal limits.  Basic metabolic profile shows all electrolytes within normal limits except  glucose of 146.  Her renal function is within normal limits.  Liver function  tests are not available.   EKG shows sinus rhythm with left axis deviation.  Q-waves appreciated in  lead 3 and aVF.  All intervals appear to be within normal limits.  The ST  segments also appear to be within normal limits.  There is some T-wave  changes in the form of inverted T-waves seen in lead V2, V3.  Some biphasic  T-waves seen in 4, 5, 6.   Chest x-ray was done which does not show any acute cardiopulmonary  abnormalities.  Heart size was seen to be normal.   IMPRESSION:  This is a 77 year old African-American female with past medical history of hypertension, hypercholesterolemia, obesity who had what sounds  like a syncopal episode yesterday  evening and fell down resulting in a right  patellar fracture.  Patient was admitted to Dr. Sanjuan Dame service to undergo  open treatment and internal reduction tomorrow.  However, patient gives this  history of this atypical chest pain sometimes  with radiation to the left  arm.  She also has Q-waves in her inferior leads.  Her functional capacity  is poor based on her history.  Patient has not had a stress test in the last  five years and she has not had a coronary angiogram also in the last five  years.  Based on the revised cardiac risk index her only positive risk  factor is the presence of the pathological Q-wave resulting in a 0.9% risk  of cardiac complications from the surgery.  However, based on the AHA/ACC  algorithm for preoperative evaluation, patient is to undergo an intermediate  risk procedure and she has poor functional capacity with intermediate  predictors.  Based on all the above I believe patient merits a noninvasive  coronary evaluation.   PLAN:  1.  Preoperative evaluation.  As mentioned above, patient should undergo a      noninvasive coronary evaluation in the form of Persantine Cardiolite      stress test.  I have discussed this with Dr. Hilda Lias and I will consult      [facility] to schedule the same for this patient.  Dr. Hilda Lias      will be postponing the patient's surgery until she is cleared after the      stress test is done.  Based on the results of the stress test further      evaluation may be required.   1.  Hypertension.  We are going to continue patient's home medications at      this time.  Once the timing of the surgery is made the medication may be      given the morning of the surgery with small amount of water.  Patient      may also benefit from perioperative beta blocker which can be given in      the form of parenteral form 5-10 mg of metoprolol to keep her heart rate      around the 60s.   1.  DVT prophylaxis should also be initiated  for the patient once her      surgery is done.   We want to thank Dr. Hilda Lias for allowing [place] to consult on this patient and  will follow this patient closely with him.      GK/MEDQ  D:  05/31/2005  T:  05/31/2005  Job:  [id number]   cc:   Milus Mallick. Lodema Hong, [name]  [address]  [place], [place] [address]  Fax: [phone]   Vida Roller, [name]  Fax: [phone]

## 2011-05-14 NOTE — Consult Note (Signed)
[name], [name]              ACCOUNT NO.:  [id number]   MEDICAL RECORD NO.:  [id number]          PATIENT TYPE:  INP   LOCATION:  A330                          FACILITY:  [facility]   PHYSICIAN:  Springer Bing, [name]  DATE OF BIRTH:  August 18, 1934   DATE OF CONSULTATION:  05/31/2005  DATE OF DISCHARGE:                                   CONSULTATION   CARDIOLOGY CONSULTATION:   REFERRING PHYSICIAN:  Dr. Hilda Lias   PRIMARY CARE PHYSICIAN:  Dr. Lodema Hong   HISTORY OF PRESENT ILLNESS:  [age]-year-old woman without known  cardiovascular disease who is to undergo orthopedic surgery. Consultation  requested due to EKG abnormalities and chest discomfort. [name] has a  number of cardiovascular risk factors including hypertension and  hyperlipidemia. Both of these have apparently been under excellent control.  She describes occasional episodes of mild to moderate left chest discomfort  radiating to the left arm. This is described as a pinching sensation  lasting a few minutes. There is no relationship to exertion, body position  or use of the upper extremities. Symptoms resolve spontaneously. She has had  no evaluation for this nor has she received any empiric therapy. She is  generally inactive, but does basic housework and shops without difficulty.  She was admitted after a fall resulting in a right patellar fracture. There  is the possibility of transient loss of consciousness.   Past medical history is otherwise notable for episodes of vertigo. A  hysterectomy was performed at age 77. [name] reports an allergy to  PENICILLIN. Her only medications are Vytorin 10/40 mg once daily and Benicar  once daily.   SOCIAL HISTORY:  Married and lives in [place]; one child who is alive and  well; a remote 35 pack-year history of cigarette smoking. Denies excessive  use of alcohol.   FAMILY HISTORY:  Mother died at an advanced age from breast cancer. Father  died at age 36 and had  heart problems. Seven siblings, none of whom have had  cardiac issues. One brother suffered a CVA at a young age. There is a family  history for diabetes.   REVIEW OF SYSTEMS:  Notable for intermittent nasal discharge, mild  headaches, dyspnea on exertion, numbness of the hands, arthralgias of the  left knee prior to her injury. She had a recent colonoscopy that was  negative. Other systems reviewed and are negative.   EXAMINATION:  GENERAL: On exam, very pleasant well-appearing woman.  VITAL SIGNS: The temperature is 98.3, heart rate 100 and regular,  respirations 20, blood pressure 155/85. No weight in the chart, but the  patient is clearly above her ideal weight.  HEENT: Anicteric sclerae; normal lids and conjunctivae.  NECK: No jugular venous distension; questionable minimal early systolic  bruit on the right; normal carotid upstrokes.  ENDOCRINE: No thyromegaly.  HEMATOPOIETIC: No adenopathy.  LUNGS: Clear.  CARDIAC: Normal first and second heart sounds; grade 2/6 systolic ejection  murmur heard across the precordium.  ABDOMEN: Soft and nontender; no organomegaly; no masses; normal bowel sounds  without bruits.  EXTREMITIES: Normal distal pulses; no edema.  MUSCULOSKELETAL:  Right knee immobilized in a binder.  NEUROMUSCULAR: Normal strength and tone throughout; normal cranial nerves.  PSYCHIATRIC: Normal affect; oriented x3.   LABORATORIES:  Other laboratory notable for potassium of 3.5, white count of  11,500.   ELECTROCARDIOGRAM:  Sinus tachycardia; borderline left atrial abnormality;  questionable prior inferior myocardial infarction; nonspecific ST-T wave  abnormality; voltage criteria for LVH.   IMPRESSION:  Ms. Knebel has a negative cardiac history but EKG abnormalities  and atypical chest discomfort. Additional testing is warranted prior to  planned orthopedic surgery. An echocardiogram is necessary to exclude prior  inferior myocardial infarction. While this is  being done, pharmacologic  stress can be undertaken with dobutamine. This will shorten the total test  time so that surgery could be planned tomorrow afternoon if necessary.   Blood pressure control appears relatively good. Medication adjustments can  be made as necessary. She does not appear to have a high enough risk for  coronary artery disease to justify adding beta blocker to her medical  regime.   Control of lipids will also be assessed.   There is a question of syncope. Monitoring would be desirable while she is  inhospital. We will consider additional tests for possible loss of  consciousness if warranted.   We greatly appreciate the request for consultation on this nice lady and  will be happy to follow her with you.       RR/MEDQ  D:  05/31/2005  T:  05/31/2005  Job:  [id number]

## 2011-05-14 NOTE — H&P (Signed)
NAMEJAMIRIA, [name]              ACCOUNT NO.:  [id number]   MEDICAL RECORD NO.:  [id number]          PATIENT TYPE:  AMB   LOCATION:                                 FACILITY:   PHYSICIAN:  J. Darreld Mclean, [name] DATE OF BIRTH:  August 08, 1934   DATE OF ADMISSION:  05/31/2005  DATE OF DISCHARGE:  [facility]                                HISTORY & PHYSICAL   CHIEF COMPLAINT:  I fell off a porch and hurt my right knee.   HISTORY OF PRESENT ILLNESS:  The patient fell off of a porch yesterday on  May 30, 2005, and injured her right knee.  She also hurt her left toe and  her left knee.  She says that she has inner ear problems and got dizzy and  fell.  She was seen in the ER last evening.  X-rays were taken and showed  displaced fracture in the right patella.  She was given a knee immobilizer  and told to see me today.  She comes in by ambulance, complaining of severe  pain and tenderness to her right knee.  X-rays show a complete separation of  the patella with inferior pole fragment distally and the large portion moves  forward more proximally with a large defect of about 3-4 cm.  I have  recommend open treatment and internal reduction.  I plan to admit her to the  hospital.  She is in so much pain, she cannot ambulate.  Her husband cannot  take care of her.  I plan to do surgery tomorrow morning.   PAST MEDICAL HISTORY:  The patient has a history of hypertension and a  history of the inner ear problem.  She denies heart disease, lung disease,  kidney disease, stroke, paralysis, weakness, diabetes, TB, rheumatic fever,  cancer and ulcer disease.   ALLERGIES:  She is allergic to PENICILLIN.   MEDICATIONS:  She is taking:  1.  Vytorin 10/40 mg one daily.  2.  Benicar 12.5 mg once daily.   SOCIAL HISTORY:  She does not smoke.  She does not drink.  She is a Engineer, structural.  Dr. Lodema Hong is her family doctor.  She is married and  lives here in [place], [place].   PAST SURGICAL  HISTORY:  She had a hysterectomy 24 years ago.   FAMILY HISTORY:  Diabetes and arthritis run in the family.   PHYSICAL EXAMINATION:  VITAL SIGNS:  The patient's BP is 168/104, pulse 100,  respirations 16 and she is afebrile.  WEIGHT:  I was unable to get her weight because I could not get her to stand  and get her height.  GENERAL APPEARANCE:  The patient is alert, cooperative and oriented.  She is  in pain.  HEENT:  Negative.  NECK:  Supple.  LUNGS:  Clear to P&A.  HEART:  Regular rate and rhythm.  No murmur heard.  ABDOMEN:  Soft, nontender and obese without masses.  EXTREMITIES:  The right knee has a large ecchymotic area and a large  effusion with marked decreased range of motion and pain.  The left great toe  has a skin avulsion.  The left knee has an abrasion.  Both elbows have  slight abrasions.  Other extremities are negative.  CENTRAL NERVOUS SYSTEM:  Intact.  SKIN:  Intact.   IMPRESSION:  1.  Displaced fracture of right patella.  2.  Avulsion of skin of left knee.  3.  History of hypertension.   PLAN:  Open treatment and internal reduction of right patellar fracture  tomorrow in the operating room.  I discussed with the patient the planned  procedure.  Will admit her today for pain control.  Nobody can take care of  her at home right now.  Will do the surgery the first thing in the morning.  Laboratories are pending.      JWK/MEDQ  D:  05/31/2005  T:  05/31/2005  Job:  [id number]

## 2011-06-14 ENCOUNTER — Telehealth: Payer: Self-pay | Admitting: Family Medicine

## 2011-06-14 NOTE — Telephone Encounter (Signed)
Pt scheduled to be seen tomorrow, pls let her know I will rx scripts then

## 2011-06-15 MED ORDER — ALPRAZOLAM 0.25 MG PO TABS: 0.2500 mg | ORAL_TABLET | Freq: Every day | ORAL | Status: DC

## 2011-06-15 MED ORDER — HYDROCODONE-ACETAMINOPHEN 5-500 MG PO TABS: 1.0000 | ORAL_TABLET | Freq: Two times a day (BID) | ORAL | Status: DC | PRN

## 2011-06-15 NOTE — Telephone Encounter (Signed)
Patient aware, meds sent, patient states she just had a death in the family and as soon as things settle down she will call to schedule appt.

## 2011-06-15 NOTE — Telephone Encounter (Signed)
New and correct response , pls refill the hydrocodone and xanax x 2 months only, tell her she needs an appt in the next 2 months pls

## 2011-06-22 ENCOUNTER — Encounter: Payer: Self-pay | Admitting: Family Medicine

## 2011-06-23 ENCOUNTER — Ambulatory Visit (INDEPENDENT_AMBULATORY_CARE_PROVIDER_SITE_OTHER): Payer: Medicare Other | Admitting: Family Medicine

## 2011-06-23 ENCOUNTER — Encounter: Payer: Self-pay | Admitting: Family Medicine

## 2011-06-23 DIAGNOSIS — F329 Major depressive disorder, single episode, unspecified: Secondary | ICD-10-CM

## 2011-06-23 DIAGNOSIS — E669 Obesity, unspecified: Secondary | ICD-10-CM

## 2011-06-23 DIAGNOSIS — R7301 Impaired fasting glucose: Secondary | ICD-10-CM

## 2011-06-23 DIAGNOSIS — J301 Allergic rhinitis due to pollen: Secondary | ICD-10-CM

## 2011-06-23 DIAGNOSIS — M549 Dorsalgia, unspecified: Secondary | ICD-10-CM

## 2011-06-23 DIAGNOSIS — E785 Hyperlipidemia, unspecified: Secondary | ICD-10-CM

## 2011-06-23 MED ORDER — SERTRALINE HCL 25 MG PO TABS: 25.0000 mg | ORAL_TABLET | Freq: Every day | ORAL | Status: DC

## 2011-06-23 NOTE — Patient Instructions (Signed)
F/u in 4 months.  Medication is unchanged  You will be scheduled for a mammogram, it is past due

## 2011-06-27 NOTE — Assessment & Plan Note (Signed)
Pt is prediabetic and need to follow low carb diet and lose weight. Needs rept HBa1C

## 2011-06-27 NOTE — Assessment & Plan Note (Signed)
Controlled, no change in medication  

## 2011-06-27 NOTE — Assessment & Plan Note (Signed)
Improved and controlled on medication 

## 2011-06-27 NOTE — Progress Notes (Signed)
  Subjective:    Patient ID: [name], female    DOB: 11-07-1934, 77 y.o.   MRN: [id number]  HPI The PT is here for follow up and re-evaluation of chronic medical conditions, medication management and review of recent lab and radiology data.  Preventive health is updated, specifically  Cancer screening,  and Immunization.   Questions or concerns regarding consultations or procedures which the PT has had in the interim are  addressed. The PT denies any adverse reactions to current medications since the last visit.  [name] was hospitalized overnight with a complaint of chest [pain, she had a negative cath.  She continues to experience exertional fatigue, but rightly attributes this to morbid obesiy with excessive abdominal girth    Review of Systems Denies recent fever or chills. Denies sinus pressure, , ear pain or sore throat.Has perennial allergies with nasal congestion and clear drainage, as well as itchy swollen eyes Denies chest congestion, productive cough or wheezing.Experiences dyspnea with minimal activity Denies chest pains, palpitations, paroxysmal nocturnal dyspnea, orthopnea and leg swelling Denies abdominal pain, nausea, vomiting,diarrhea or constipation.  Denies rectal bleeding or change in bowel movement. Denies dysuria, frequency, hesitancy or incontinence. Chronic back pain with reduced mobility Denies headaches, seizure, numbness, or tingling. Denies uncontrolled  depression, anxiety or insomnia.On medication for this Denies skin break down or rash.        Objective:   Physical Exam Patient alert and oriented and in no Cardiopulmonary distress.  HEENT: No facial asymmetry, EOMI, no sinus tenderness, TM's clear, Oropharynx pink and moist.  Neck supple no adenopathy.  Chest: Clear to auscultation bilaterally Decreased air entry throughout.  CVS: S1, S2 no murmurs, no S3.  ABD: Soft non tender. Bowel sounds normal.  Ext: No edema  MS: decreased  ROM  spine, shoulders, hips and knees.  Skin: Intact, no ulcerations or rash noted.  Psych: Good eye contact, normal affect. Memory intact not anxious or depressed appearing.  CNS: CN 2-12 intact, power, tone and sensation normal throughout.        Assessment & Plan:

## 2011-06-27 NOTE — Assessment & Plan Note (Signed)
Pt is prediabetic , needs to aggressively work on lifestyle change to prevent dibetes

## 2011-06-27 NOTE — Assessment & Plan Note (Signed)
Unchanged, on no prescription med

## 2011-06-27 NOTE — Assessment & Plan Note (Signed)
Low fat diet encouraged, LDL elevated

## 2011-07-07 ENCOUNTER — Telehealth: Payer: Self-pay | Admitting: *Deleted

## 2011-07-07 DIAGNOSIS — R7303 Prediabetes: Secondary | ICD-10-CM

## 2011-07-07 NOTE — Telephone Encounter (Signed)
Message copied by Diamantina Monks on Wed Jul 07, 2011  3:04 PM ------      Message from: Syliva Overman MD E      Created: Sun Jun 27, 2011 11:05 PM       pls call pt she needs to have hBA1C  End July pls order also, she is prediabetic basedd on labs she had while in the hospital and this needs to be followed up, explain the need to change her diet and lose weight so she does not become diabewtic

## 2011-07-07 NOTE — Telephone Encounter (Signed)
Lab ordered and faxed in, patient aware

## 2011-08-03 ENCOUNTER — Telehealth: Payer: Self-pay | Admitting: Family Medicine

## 2011-08-03 NOTE — Telephone Encounter (Signed)
She needs a lung function test to see if she needs this first since the neb machine is not without adverse side effects, can cause heart racing , let me know if she agrees so I will refer her for lung function test

## 2011-08-04 ENCOUNTER — Other Ambulatory Visit: Payer: Self-pay | Admitting: Family Medicine

## 2011-08-04 DIAGNOSIS — R062 Wheezing: Secondary | ICD-10-CM

## 2011-08-04 NOTE — Telephone Encounter (Signed)
Patient agrees.

## 2011-08-04 NOTE — Telephone Encounter (Signed)
pls refer for lung function test, order is entered

## 2011-08-10 ENCOUNTER — Ambulatory Visit (HOSPITAL_COMMUNITY)
Admission: RE | Admit: 2011-08-10 | Discharge: 2011-08-10 | Disposition: A | Discharge status: Home / Self Care | Payer: Medicare Other | Source: Ambulatory Visit | Attending: Family Medicine | Admitting: Family Medicine

## 2011-08-10 DIAGNOSIS — R0602 Shortness of breath: Secondary | ICD-10-CM | POA: Insufficient documentation

## 2011-08-10 LAB — PULMONARY FUNCTION TEST

## 2011-08-10 MED ORDER — ALBUTEROL SULFATE (5 MG/ML) 0.5% IN NEBU
2.5000 mg | INHALATION_SOLUTION | Freq: Once | RESPIRATORY_TRACT | Status: AC
  Administered 2011-08-10: 2.5 mg via RESPIRATORY_TRACT

## 2011-08-12 NOTE — Procedures (Signed)
NAMELIZBET, CIRRINCIONE              ACCOUNT NO.:  [id number]  MEDICAL RECORD NO.:  [id number]  LOCATION:  RESP                          FACILITY:  [facility]  PHYSICIAN:  Dagon Budai L. Juanetta Gosling, M.D.DATE OF BIRTH:  January 15, 1934  DATE OF PROCEDURE: DATE OF DISCHARGE:  08/10/2011                           PULMONARY FUNCTION TEST   Reason for pulmonary function testing is shortness of breath. 1. Spirometry shows a moderate ventilatory defect without evidence of     airflow obstruction. 2. Lung volumes show reduction in total lung capacity of approximately     the same degree of magnitude as the ventilatory defect. 3. DLCO is also moderately reduced and does correct some for volume. 4. There is significant bronchodilator improvement. 5. Noting the patient's height and weight, some of the changes maybe     related to body habitus.     Drae Mitzel L. Juanetta Gosling, [name]     ELH/MEDQ  D:  08/12/2011  T:  08/12/2011  Job:  [id number]  cc:   Milus Mallick. Lodema Hong, [name] Fax: [phone]

## 2011-09-01 ENCOUNTER — Telehealth: Payer: Self-pay | Admitting: Family Medicine

## 2011-09-02 ENCOUNTER — Telehealth: Payer: Self-pay | Admitting: Family Medicine

## 2011-09-03 ENCOUNTER — Encounter (HOSPITAL_COMMUNITY): Payer: Self-pay | Admitting: Family Medicine

## 2011-09-03 MED ORDER — HYDROCODONE-ACETAMINOPHEN 5-500 MG PO TABS: 1.0000 | ORAL_TABLET | Freq: Two times a day (BID) | ORAL | Status: DC | PRN

## 2011-09-03 NOTE — Telephone Encounter (Signed)
DUPLICATE MESSAGE

## 2011-09-03 NOTE — Telephone Encounter (Signed)
REFILLED AS REQUESTED ?

## 2011-09-21 LAB — DIFFERENTIAL
Basophils Relative: 0
Eosinophils Absolute: 0.1
Lymphs Abs: 3.7
Monocytes Absolute: 1.2 — ABNORMAL HIGH
Monocytes Relative: 11
Neutrophils Relative %: 53

## 2011-09-21 LAB — PROTIME-INR: INR: 0.9

## 2011-09-21 LAB — BASIC METABOLIC PANEL
CO2: 27
Calcium: 10
Glucose, Bld: 102 — ABNORMAL HIGH
Sodium: 140

## 2011-09-21 LAB — CBC
HCT: 43.2
Hemoglobin: 13.8
MCHC: 31.9
RDW: 15.3

## 2011-09-21 LAB — APTT: aPTT: 28

## 2011-10-22 ENCOUNTER — Encounter: Payer: Self-pay | Admitting: Family Medicine

## 2011-10-25 ENCOUNTER — Encounter: Payer: Self-pay | Admitting: Family Medicine

## 2011-10-25 ENCOUNTER — Ambulatory Visit (INDEPENDENT_AMBULATORY_CARE_PROVIDER_SITE_OTHER): Payer: Medicare Other | Admitting: Family Medicine

## 2011-10-25 VITALS — BP 138/88 | HR 81 | Resp 16 | Ht 64.0 in | Wt 227.8 lb

## 2011-10-25 DIAGNOSIS — M5126 Other intervertebral disc displacement, lumbar region: Secondary | ICD-10-CM

## 2011-10-25 DIAGNOSIS — M549 Dorsalgia, unspecified: Secondary | ICD-10-CM

## 2011-10-25 DIAGNOSIS — R5381 Other malaise: Secondary | ICD-10-CM

## 2011-10-25 DIAGNOSIS — R5383 Other fatigue: Secondary | ICD-10-CM

## 2011-10-25 DIAGNOSIS — W19XXXA Unspecified fall, initial encounter: Secondary | ICD-10-CM

## 2011-10-25 DIAGNOSIS — M25569 Pain in unspecified knee: Secondary | ICD-10-CM

## 2011-10-25 DIAGNOSIS — E785 Hyperlipidemia, unspecified: Secondary | ICD-10-CM

## 2011-10-25 DIAGNOSIS — M5430 Sciatica, unspecified side: Secondary | ICD-10-CM

## 2011-10-25 DIAGNOSIS — J209 Acute bronchitis, unspecified: Secondary | ICD-10-CM

## 2011-10-25 MED ORDER — ALPRAZOLAM 0.25 MG PO TABS: 0.2500 mg | ORAL_TABLET | Freq: Every day | ORAL | Status: DC

## 2011-10-25 MED ORDER — HYDROCODONE-ACETAMINOPHEN 5-500 MG PO TABS: 1.0000 | ORAL_TABLET | Freq: Two times a day (BID) | ORAL | Status: DC | PRN

## 2011-10-25 MED ORDER — AZITHROMYCIN 250 MG PO TABS: ORAL_TABLET | ORAL | Status: AC

## 2011-10-25 MED ORDER — BENZONATATE 100 MG PO CAPS: 100.0000 mg | ORAL_CAPSULE | Freq: Four times a day (QID) | ORAL | Status: DC | PRN

## 2011-10-25 MED ORDER — SERTRALINE HCL 25 MG PO TABS: 25.0000 mg | ORAL_TABLET | Freq: Every day | ORAL | Status: DC

## 2011-10-25 MED ORDER — CYCLOBENZAPRINE HCL 5 MG PO TABS: 5.0000 mg | ORAL_TABLET | Freq: Three times a day (TID) | ORAL | Status: AC | PRN

## 2011-10-25 NOTE — Assessment & Plan Note (Signed)
Increased right knee pain s/op fall, no med change

## 2011-10-25 NOTE — Assessment & Plan Note (Signed)
Increased low back pain with spasm s/p fall, will add muscle relaxant

## 2011-10-25 NOTE — Progress Notes (Signed)
  Subjective:    Patient ID: [name], female    DOB: 11-18-1934, 77 y.o.   MRN: [id number]  HPI  2 weeks ago fell on leaves in the street in [place], bruised right side of face and arm as well as hurt lower back and lower extremity. Has not been to MD before this. Less pain, but still experiencing back spasm. No desire to see ortho , I specifically asked. No new lower ext weakness or numbness or incontinence of stool or urine. Had a scab on right upper face which has fallen off. No LOC, no blood or fluid from ears or nose. 2 week h/o head and chest congestion, clear nasal drainage yellow green sputum  Review of Systems See HPI Denies recent fever or chills.  Denies chest pains, palpitations and leg swelling Denies abdominal pain, nausea, vomiting,diarrhea or constipation.   Denies dysuria, frequency, hesitancy or incontinence. Denies headaches, seizures, numbness, or tingling. Denies depression, anxiety or insomnia.       Objective:   Physical Exam  Patient alert and oriented and in no cardiopulmonary distress.  HEENT: No facial asymmetry, EOMI, maxillary  sinus tenderness,  oropharynx pink and moist.  Neck =decreased ROM no adenopathy.  Chest: decreased air entry, scattered crackles CVS: S1, S2 no murmurs, no S3.  ABD: Soft non tender. Bowel sounds normal.  Ext: No edema  MS: decreased ROM spine, shoulders, hips and knees.  Skin: Brusing noted on right side of face and arm  Psych: Good eye contact, normal affect. Memory intact not anxious or depressed appearing.  CNS: CN 2-12 intact, power, tone and sensation normal throughout.       Assessment & Plan:

## 2011-10-25 NOTE — Patient Instructions (Addendum)
F/u in 4.5 months.  Continue pain meds as before, and muscle relaxant is added for as needed use , since your recent fall.  Antibiotic and decongestant prescribed for your chest congestion.  Return in 2 to 3 weeks for nurse visit for flu vaccine, appt will be given at checkout  Fasting labs, lipid, chem 7 , cbc and TSH in 4.5 months, before visit  LABWORK  NEEDS TO BE DONE BETWEEN 3 TO 7 DAYS BEFORE YOUR NEXT SCEDULED  VISIT.  THIS WILL IMPROVE THE QUALITY OF YOUR CARE.

## 2011-10-31 DIAGNOSIS — R296 Repeated falls: Secondary | ICD-10-CM | POA: Insufficient documentation

## 2011-10-31 NOTE — Assessment & Plan Note (Signed)
antibiotic and decongestant prescribed 

## 2011-10-31 NOTE — Assessment & Plan Note (Signed)
Deteriorated, with spasm s/p fall will refer to physical therapy

## 2011-10-31 NOTE — Assessment & Plan Note (Signed)
S/p fall approx 2 weeks ago with continued back and lower extremity pain and spasm, , muscle relaxants and pain meds prescribed, therapy offered

## 2011-11-09 ENCOUNTER — Ambulatory Visit: Payer: Medicare Other

## 2011-11-10 ENCOUNTER — Ambulatory Visit (HOSPITAL_COMMUNITY)
Admission: RE | Admit: 2011-11-10 | Discharge: 2011-11-10 | Disposition: A | Discharge status: Home / Self Care | Payer: Medicare Other | Source: Ambulatory Visit | Attending: Family Medicine | Admitting: Family Medicine

## 2011-11-10 DIAGNOSIS — I10 Essential (primary) hypertension: Secondary | ICD-10-CM | POA: Insufficient documentation

## 2011-11-10 DIAGNOSIS — M545 Low back pain, unspecified: Secondary | ICD-10-CM | POA: Insufficient documentation

## 2011-11-10 DIAGNOSIS — M25569 Pain in unspecified knee: Secondary | ICD-10-CM | POA: Insufficient documentation

## 2011-11-10 DIAGNOSIS — IMO0001 Reserved for inherently not codable concepts without codable children: Secondary | ICD-10-CM | POA: Insufficient documentation

## 2011-11-10 DIAGNOSIS — R29898 Other symptoms and signs involving the musculoskeletal system: Secondary | ICD-10-CM | POA: Insufficient documentation

## 2011-11-10 NOTE — Progress Notes (Signed)
Physical Therapy Evaluation  Patient Details  Name: [name] MRN: [id number] Date of Birth: 1934/03/20  Today's Date: 11/10/2011 Time: 1520-1600 Time Calculation (min): 40 min Visit#: 1  of 8   Re-eval: 12/10/11 Assessment Diagnosis: mm strain Next MD Visit: 02/2012 Prior Therapy: none  Past Medical History:  Past Medical History  Diagnosis Date  . High blood pressure   . High cholesterol   . Headache   . Urinary incontinence   . Obesity   . Hyperlipidemia   . Hypertension   . Sciatica   . Back pain   . Family history of arthritis   . Family history of ischemic heart disease   . Family history of diabetes mellitus   . Personal history of unspecified circulatory disease    Past Surgical History:  Past Surgical History  Procedure Date  . Right knee surgery after fracture 2006  . Abdominal hysterectomy   . Back surgery 2009    Dr. Juliann Pares Nuerosurgeon  . Spine surgery     Subjective Symptoms/Limitations Symptoms: The patient states that she fell a couple of weeks ago.  She states that she injured her right knee and her back during the fall.  She states her whole entire right side hurts.  She states her most pain is in her knee and back.  She has not had x-rays.   How long can you sit comfortably?: The patient states if she leans back so that her back touches the back of the chair she has increased pain. How long can you stand comfortably?: The patient states that standing aggrevates her knee and back so she is only able to stand for five or less minutes. How long can you walk comfortably?: The paitne states that she uses the cane since she fell.  The patien states that walking is more difficult than standing. Special Tests: The pateint states that she needs to take pain medication to sleep and will still wake up one to two times a night. Pain Assessment Currently in Pain?: Yes Pain Score:   9 Pain Location: Knee Pain Orientation: Right;Lateral Pain  Type: Chronic pain Pain Onset: More than a month ago Pain Frequency: Constant Pain Relieving Factors: medication but she does not like to take the medication.  She has tried icy hot that helps temporarily. Effect of Pain on Daily Activities: increases. Multiple Pain Sites: Yes   Prior Functioning  Home Living Lives With: Other (Comment) (great-granddaughter) Home Layout: One level Home Access: Stairs to enter Entrance Stairs-Rails: Right Entrance Stairs-Number of Steps: 2 Prior Function Level of Independence: Independent with basic ADLs Able to Take Stairs?: Reciprically (unable to complete reciprically) Driving: No Vocation: Retired Leisure: Hobbies-yes (Comment) Comments: needle poin/ work in the yard.   Objective: RLE Strength Right Hip Flexion: 3-/5 Right Hip Extension: 3-/5 Right Hip ABduction: 3-/5 Right Hip ADduction: 3+/5 Right Knee Flexion: 3+/5 Right Knee Extension: 3/5 Right Ankle Dorsiflexion: 5/5 LLE Strength Left Hip Flexion: 3+/5 Left Hip Extension: 3+/5 Left Hip ABduction: 4/5 Left Hip ADduction: 4/5 Left Knee Flexion: 5/5 Left Knee Extension: 5/5 Left Ankle Dorsiflexion: 5/5 Lumbar AROM Overall Lumbar AROM: Within functional limits for tasks performed (pt complains of increased pain with return of flexion)  Exercise/Treatments Stretches Single Knee to Chest Stretch: 3 reps;20 seconds Standing Extension: 5 reps Lumbar Exercises   Stability Ab Set: 5 reps Lifting: Limitations Lifting Limitations: adduction squeeze x 10   Manual Therapy Manual Therapy: Other (comment) Other Manual Therapy: massage to R upper  gluteal region  Physical Therapy Assessment and Plan PT Assessment and Plan Clinical Impression Statement: Pt with pain, weakness and history of falling who will benefit from skilled PT to address the above issues and maximize functional potential to improve the quality of the patients life. Clinical Impairments Affecting Rehab Potential:  pain, weakness PT Frequency: Min 2X/week PT Duration: 4 weeks PT Treatment/Interventions: Gait training;Therapeutic exercise;Functional mobility training PT Plan: see for ex and pain relief.  Next treatment begin bridging, bent knee raise, clam.  Begin Ultrasound to R back area to decrease sx of pain.    Goals Home Exercise Program Pt will Perform Home Exercise Program: Independently PT Short Term Goals Time to Complete Short Term Goals: 2 weeks PT Short Term Goal 1: Pain to be no greater than a 6 in patient's knee, no greater than a 5 in her back PT Short Term Goal 2: Patient to be able to stand for 15 min without pain to make a small meal PT Short Term Goal 3: Patient to be able to walk for 20 min to be able to complete some light housework. PT Long Term Goals Time to Complete Long Term Goals: 4 weeks PT Long Term Goal 1: I in advance HEP PT Long Term Goal 2: Pain in knee no greater than a 3; pain in back no greater than a 2 Long Term Goal 3: able to stand for Long Term Goal 4: able to walk without a cane for 20 min. PT Long Term Goal 5: able to sleep without pain medication.  Problem List Patient Active Problem List  Diagnoses  . HYPERLIPIDEMIA  . OBESITY  . DEPRESSION  . UNSPECIFIED VISUAL LOSS  . ALLERGIC RHINITIS, SEASONAL  . DERMATITIS  . KNEE PAIN  . DEGENERATIVE DISC DISEASE, LUMBOSACRAL SPINE W/RADICULOPATHY  . NECK PAIN  . SPINAL STENOSIS  . SCIATICA  . BACK PAIN  . Trigger Finger (Acquired)  . FOOT PAIN, LEFT  . OTHER OSTEOPOROSIS  . DISORDER OF BONE AND CARTILAGE UNSPECIFIED  . FATIGUE  . HEADACHE  . CAROTID BRUIT  . SHORTNESS OF BREATH  . URINARY INCONTINENCE  . IMPAIRED FASTING GLUCOSE  . BLOOD IN STOOL, OCCULT  . Bronchitis, acute  . Fall  . Right leg weakness    PT - End of Session Activity Tolerance: Patient tolerated treatment well General Behavior During Session: [facility] for tasks performed Cognition: [facility] for tasks  performed   RUSSELL,CINDY 11/10/2011, 4:16 PM  Physician Documentation Your signature is required to indicate approval of the treatment plan as stated above.  Please sign and either send electronically or make a copy of this report for your files and return this physician signed original.   Please mark one 1.__approve of plan  2. ___approve of plan with the following conditions.   ______________________________                                                          _____________________ Physician Signature  Date  

## 2011-11-10 NOTE — Patient Instructions (Addendum)
HEP

## 2011-11-16 ENCOUNTER — Ambulatory Visit (HOSPITAL_COMMUNITY): Payer: Medicare Other | Admitting: *Deleted

## 2011-11-16 ENCOUNTER — Telehealth (HOSPITAL_COMMUNITY): Payer: Self-pay

## 2011-11-23 ENCOUNTER — Ambulatory Visit (HOSPITAL_COMMUNITY)
Admission: RE | Admit: 2011-11-23 | Discharge: 2011-11-23 | Disposition: A | Discharge status: Home / Self Care | Payer: Medicare Other | Source: Ambulatory Visit | Attending: Family Medicine | Admitting: Family Medicine

## 2011-11-23 NOTE — Progress Notes (Signed)
Physical Therapy Treatment Patient Details  Name: [name] MRN: [id number] Date of Birth: October 12, 1934  Today's Date: 11/23/2011 Time: 4098-1191 Time Calculation (min): 62 min Visit#: 2  of 8   Re-eval: 12/10/11 Charges: Therex 38' Ultrasound x 8' Manual x 8'  Subjective: Symptoms/Limitations Symptoms: Pt reports that she feels better than she did at evaluation. Pt also reports she was able to take a shower standing up today which she has been unable to do. Pain Assessment Currently in Pain?: Yes Pain Score:   7 Pain Location: Hip (going down to knee) Pain Orientation: Right   Exercise/Treatments Stretches Single Knee to Chest Stretch: 3 reps;20 seconds Stability Bridge: 10 reps;3 seconds Bent Knee Raise: 10 reps Ab Set: 10 reps;5 seconds Straight Leg Raise: 10 reps;Supine Lifting: Limitations Lifting Limitations: adduction squeeze 10x5"  Modalities Modalities: Ultrasound Manual Therapy Manual Therapy: Other (comment) Other Manual Therapy: massage to R upper gluteal region x 8' Ultrasound Ultrasound Location: R lower back/gluteal region Ultrasound Parameters: @ 1.0w/cm2 continuous x 8' Ultrasound Goals: Pain  Physical Therapy Assessment and Plan PT Assessment and Plan Clinical Impression Statement: Pt completes therex with minimal difficulty and good form. Pt requires VC's to avoid holding breath with mm contraciton during exercises. Ultrasound complete to R lowerback/glueal region to decrease pain. STM w/MFR completed to R gluteal redion. PT Treatment/Interventions: Therapeutic exercise;Other (comment) (Ultrasound; Manual) PT Plan: Cotninue to progress strength and decrease pain per PT POC. Assess reaction to ultrasound next session.     Problem List Patient Active Problem List  Diagnoses  . HYPERLIPIDEMIA  . OBESITY  . DEPRESSION  . UNSPECIFIED VISUAL LOSS  . ALLERGIC RHINITIS, SEASONAL  . DERMATITIS  . KNEE PAIN  . DEGENERATIVE DISC DISEASE,  LUMBOSACRAL SPINE W/RADICULOPATHY  . NECK PAIN  . SPINAL STENOSIS  . SCIATICA  . BACK PAIN  . Trigger Finger (Acquired)  . FOOT PAIN, LEFT  . OTHER OSTEOPOROSIS  . DISORDER OF BONE AND CARTILAGE UNSPECIFIED  . FATIGUE  . HEADACHE  . CAROTID BRUIT  . SHORTNESS OF BREATH  . URINARY INCONTINENCE  . IMPAIRED FASTING GLUCOSE  . BLOOD IN STOOL, OCCULT  . Bronchitis, acute  . Fall  . Right leg weakness    PT - End of Session Activity Tolerance: Patient tolerated treatment well General Behavior During Session: [facility] for tasks performed Cognition: [facility] for tasks performed  Antonieta Iba 11/23/2011, 11:41 AM

## 2011-11-25 ENCOUNTER — Ambulatory Visit (HOSPITAL_COMMUNITY)
Admission: RE | Admit: 2011-11-25 | Discharge: 2011-11-25 | Disposition: A | Discharge status: Home / Self Care | Payer: Medicare Other | Source: Ambulatory Visit | Attending: Family Medicine | Admitting: Family Medicine

## 2011-11-25 DIAGNOSIS — R29898 Other symptoms and signs involving the musculoskeletal system: Secondary | ICD-10-CM

## 2011-11-25 NOTE — Progress Notes (Signed)
Physical Therapy Treatment Patient Details  Name: EDAN SERRATORE MRN: [id number] Date of Birth: 02-26-1934  Today's Date: 11/25/2011 Time: 6606-3016 Time Calculation (min): 43 min Visit#: 3  of 8   Re-eval: 12/08/11  charge:  [place] x 8';  There ex x 33  Subjective: Symptoms/Limitations Symptoms: Pt states she is doing her exercises at home.   Pain Assessment Currently in Pain?: Yes Pain Score:   5 Pain Location: Back Pain Orientation: Right      Exercise/Treatments Stretches Single Knee to Chest Stretch: 3 reps;30 seconds Piriformis Stretch: 2 reps;30 seconds Lumbar Exercises   Stability Clam: Side-lying;10 reps Bridge: 15 reps Bent Knee Raise: 10 reps Isometric Hip Flexion: 10 reps Straight Leg Raise: 10 reps Hip Abduction: Side-lying;10 reps Functional Squats: 10 reps;3 seconds Lifting Limitations:  (adduction squeeze x 15)    Modalities Modalities: Ultrasound Ultrasound Ultrasound Location: R lower back/ gluteal region Ultrasound Parameters: 1.5w/cm2  using x 8' Ultrasound Goals: Pain  Physical Therapy Assessment and Plan PT Assessment and Plan Clinical Impression Statement: Added sl abduction and clam exercises. PT Plan: begin dead bug instead of bent knee raise and heelsqueeze using 3 pillows.    Goals  Pain free  Problem List Patient Active Problem List  Diagnoses  . HYPERLIPIDEMIA  . OBESITY  . DEPRESSION  . UNSPECIFIED VISUAL LOSS  . ALLERGIC RHINITIS, SEASONAL  . DERMATITIS  . KNEE PAIN  . DEGENERATIVE DISC DISEASE, LUMBOSACRAL SPINE W/RADICULOPATHY  . NECK PAIN  . SPINAL STENOSIS  . SCIATICA  . BACK PAIN  . Trigger Finger (Acquired)  . FOOT PAIN, LEFT  . OTHER OSTEOPOROSIS  . DISORDER OF BONE AND CARTILAGE UNSPECIFIED  . FATIGUE  . HEADACHE  . CAROTID BRUIT  . SHORTNESS OF BREATH  . URINARY INCONTINENCE  . IMPAIRED FASTING GLUCOSE  . BLOOD IN STOOL, OCCULT  . Bronchitis, acute  . Fall  . Right leg weakness    PT - End of  Session Activity Tolerance: Patient tolerated treatment well General Behavior During Session: [facility] for tasks performed Cognition: [facility] for tasks performed  Jebediah Macrae,CINDY 11/25/2011, 10:59 AM

## 2011-11-30 ENCOUNTER — Ambulatory Visit (HOSPITAL_COMMUNITY)
Admission: RE | Admit: 2011-11-30 | Discharge: 2011-11-30 | Disposition: A | Discharge status: Home / Self Care | Payer: Medicare Other | Source: Ambulatory Visit | Attending: Family Medicine | Admitting: Family Medicine

## 2011-11-30 DIAGNOSIS — IMO0001 Reserved for inherently not codable concepts without codable children: Secondary | ICD-10-CM | POA: Insufficient documentation

## 2011-11-30 DIAGNOSIS — M25569 Pain in unspecified knee: Secondary | ICD-10-CM | POA: Insufficient documentation

## 2011-11-30 DIAGNOSIS — M545 Low back pain, unspecified: Secondary | ICD-10-CM | POA: Insufficient documentation

## 2011-11-30 DIAGNOSIS — I10 Essential (primary) hypertension: Secondary | ICD-10-CM | POA: Insufficient documentation

## 2011-11-30 NOTE — Progress Notes (Addendum)
Physical Therapy Treatment Patient Details  Name: [name] MRN: [id number] Date of Birth: 07-23-1934  TIME: 1014-1107/ 15 mins- Ultrasound-45 mins TE Today's Date: 11/30/2011 Time: 4098-1191 Time Calculation (min): 53 min Visit#: 4  of 8   Re-eval: 12/08/11    Subjective: Symptoms/Limitations Symptoms: Pt slipped coming downstairs this morning at home, catching herself on the wall;pt now c/o of 9/10 pain in R hip and 10/10 R knee pain Pain Assessment Currently in Pain?: Yes Pain Score: 10-Worst pain ever Pain Location: Hip Pain Orientation: Right  Precautions/Restrictions     Mobility (including Balance)       Exercise/Treatments Stretches Single Knee to Chest Stretch: 3 reps;30 seconds Piriformis Stretch: 2 reps;30 seconds Lumbar Exercises   Stability Clam: Side-lying;10 reps Dead Bug: 10 reps Bridge: 15 reps Isometric Hip Flexion: 10 reps Straight Leg Raise: 10 reps Hip Abduction: Side-lying;10 reps Heel Squeeze: 10 reps Functional Squats: 10 reps;3 seconds Lifting Limitations: adduction squeeze 10x5"     Ultrasound Ultrasound Location: R lower back/gluteal region Ultrasound Parameters: 1.5w/cm2 using x 8' Ultrasound Goals: Pain  Physical Therapy Assessment and Plan PT Assessment and Plan Clinical Impression Statement: replaced bent knee raise with dead bug, could not add heel squeeze due to time today; ended session with ultrasound, pt reported less pain upon completion        Problem List Patient Active Problem List  Diagnoses  . HYPERLIPIDEMIA  . OBESITY  . DEPRESSION  . UNSPECIFIED VISUAL LOSS  . ALLERGIC RHINITIS, SEASONAL  . DERMATITIS  . KNEE PAIN  . DEGENERATIVE DISC DISEASE, LUMBOSACRAL SPINE W/RADICULOPATHY  . NECK PAIN  . SPINAL STENOSIS  . SCIATICA  . BACK PAIN  . Trigger Finger (Acquired)  . FOOT PAIN, LEFT  . OTHER OSTEOPOROSIS  . DISORDER OF BONE AND CARTILAGE UNSPECIFIED  . FATIGUE  . HEADACHE  . CAROTID  BRUIT  . SHORTNESS OF BREATH  . URINARY INCONTINENCE  . IMPAIRED FASTING GLUCOSE  . BLOOD IN STOOL, OCCULT  . Bronchitis, acute  . Fall  . Right leg weakness    PT - End of Session Activity Tolerance: Patient tolerated treatment well General Behavior During Session: [facility] for tasks performed Cognition: [facility] for tasks performed  [name] 11/30/2011, 11:12 AM

## 2011-12-01 ENCOUNTER — Telehealth (HOSPITAL_COMMUNITY): Payer: Self-pay

## 2011-12-02 ENCOUNTER — Ambulatory Visit (HOSPITAL_COMMUNITY): Payer: Medicare Other | Admitting: Physical Therapy

## 2011-12-04 ENCOUNTER — Emergency Department (HOSPITAL_COMMUNITY)
Admission: EM | Admit: 2011-12-04 | Discharge: 2011-12-04 | Disposition: A | Discharge status: Home / Self Care | Payer: Medicare Other | Attending: Emergency Medicine | Admitting: Emergency Medicine

## 2011-12-04 ENCOUNTER — Emergency Department (HOSPITAL_COMMUNITY): Payer: Medicare Other

## 2011-12-04 ENCOUNTER — Encounter (HOSPITAL_COMMUNITY): Payer: Self-pay | Admitting: *Deleted

## 2011-12-04 ENCOUNTER — Other Ambulatory Visit: Payer: Self-pay

## 2011-12-04 DIAGNOSIS — R079 Chest pain, unspecified: Secondary | ICD-10-CM | POA: Insufficient documentation

## 2011-12-04 DIAGNOSIS — E7800 Pure hypercholesterolemia, unspecified: Secondary | ICD-10-CM | POA: Insufficient documentation

## 2011-12-04 DIAGNOSIS — J40 Bronchitis, not specified as acute or chronic: Secondary | ICD-10-CM

## 2011-12-04 DIAGNOSIS — E785 Hyperlipidemia, unspecified: Secondary | ICD-10-CM | POA: Insufficient documentation

## 2011-12-04 DIAGNOSIS — J9801 Acute bronchospasm: Secondary | ICD-10-CM | POA: Insufficient documentation

## 2011-12-04 DIAGNOSIS — I10 Essential (primary) hypertension: Secondary | ICD-10-CM | POA: Insufficient documentation

## 2011-12-04 LAB — DIFFERENTIAL
Lymphs Abs: 3.4 10*3/uL (ref 0.7–4.0)
Monocytes Absolute: 1 10*3/uL (ref 0.1–1.0)
Monocytes Relative: 13 % — ABNORMAL HIGH (ref 3–12)
Neutro Abs: 2.9 10*3/uL (ref 1.7–7.7)
Neutrophils Relative %: 40 % — ABNORMAL LOW (ref 43–77)

## 2011-12-04 LAB — COMPREHENSIVE METABOLIC PANEL
Alkaline Phosphatase: 84 U/L (ref 39–117)
BUN: 7 mg/dL (ref 6–23)
Chloride: 102 mEq/L (ref 96–112)
Creatinine, Ser: 0.62 mg/dL (ref 0.50–1.10)
GFR calc Af Amer: >90 mL/min (ref >90)
GFR calc non Af Amer: 86 mL/min — ABNORMAL LOW (ref >90)
Glucose, Bld: 108 mg/dL — ABNORMAL HIGH (ref 70–99)
Potassium: 3.6 mEq/L (ref 3.5–5.1)
Total Bilirubin: 0.5 mg/dL (ref 0.3–1.2)

## 2011-12-04 LAB — CBC
HCT: 43.9 % (ref 36.0–46.0)
Hemoglobin: 13.3 g/dL (ref 12.0–15.0)
MCH: 25.4 pg — ABNORMAL LOW (ref 26.0–34.0)
RBC: 5.24 MIL/uL — ABNORMAL HIGH (ref 3.87–5.11)

## 2011-12-04 LAB — POCT I-STAT TROPONIN I: Troponin i, poc: 0 ng/mL (ref 0.00–0.08)

## 2011-12-04 MED ORDER — ALBUTEROL SULFATE HFA 108 (90 BASE) MCG/ACT IN AERS: 1.0000 | INHALATION_SPRAY | Freq: Four times a day (QID) | RESPIRATORY_TRACT | Status: DC | PRN

## 2011-12-04 MED ORDER — ALBUTEROL SULFATE HFA 108 (90 BASE) MCG/ACT IN AERS
2.0000 | INHALATION_SPRAY | RESPIRATORY_TRACT | Status: DC | PRN
  Administered 2011-12-04: 2 via RESPIRATORY_TRACT
  Filled 2011-12-04: qty 6.7

## 2011-12-04 MED ORDER — PREDNISONE 20 MG PO TABS
40.0000 mg | ORAL_TABLET | Freq: Once | ORAL | Status: AC
  Administered 2011-12-04: 40 mg via ORAL

## 2011-12-04 MED ORDER — ALBUTEROL SULFATE (5 MG/ML) 0.5% IN NEBU
5.0000 mg | INHALATION_SOLUTION | Freq: Once | RESPIRATORY_TRACT | Status: AC
  Administered 2011-12-04: 5 mg via RESPIRATORY_TRACT
  Filled 2011-12-04: qty 1

## 2011-12-04 MED ORDER — ALBUTEROL SULFATE HFA 108 (90 BASE) MCG/ACT IN AERS: 1.0000 | INHALATION_SPRAY | Freq: Once | RESPIRATORY_TRACT | Status: DC

## 2011-12-04 MED ORDER — DOXYCYCLINE HYCLATE 100 MG PO CAPS: 100.0000 mg | ORAL_CAPSULE | Freq: Two times a day (BID) | ORAL | Status: AC

## 2011-12-04 MED ORDER — PREDNISONE 10 MG PO TABS: 20.0000 mg | ORAL_TABLET | Freq: Every day | ORAL | Status: DC

## 2011-12-04 MED ORDER — PREDNISONE 20 MG PO TABS
40.0000 mg | ORAL_TABLET | Freq: Once | ORAL | Status: AC
  Administered 2011-12-04: 40 mg via ORAL
  Filled 2011-12-04: qty 2

## 2011-12-04 MED ORDER — IPRATROPIUM BROMIDE 0.02 % IN SOLN
0.5000 mg | Freq: Once | RESPIRATORY_TRACT | Status: AC
  Administered 2011-12-04: 0.5 mg via RESPIRATORY_TRACT
  Filled 2011-12-04: qty 2.5

## 2011-12-04 NOTE — ED Provider Notes (Signed)
History    This chart was scribed for Kendra Lennert, MD, MD by [name]. The patient was seen in room APA02 and the patient's care was started at 11:49AM.   CSN: [id number] Arrival date & time: 12/04/2011 11:28 AM   First MD Initiated Contact with Patient 12/04/11 1135      Chief Complaint  Patient presents with  . Chest Pain  . Cough  . Nasal Congestion    (Consider location/radiation/quality/duration/timing/severity/associated sxs/prior treatment) The history is provided by the patient.   [name] is a 77 y.o. female who presents to the Emergency Department complaining of moderate chest pain radiating to neck, arm and fingers onset 2 days. Pt reports a productive cough, SOB and generalized body aches. Pt denies vomiting, nausea, diarrhea, headache. PCP Dr. Lodema [name]  Past Medical History  Diagnosis Date  . High blood pressure   . High cholesterol   . Headache   . Urinary incontinence   . Obesity   . Hyperlipidemia   . Hypertension   . Sciatica   . Back pain   . Family history of arthritis   . Family history of ischemic heart disease   . Family history of diabetes mellitus   . Personal history of unspecified circulatory disease     Past Surgical History  Procedure Date  . Right knee surgery after fracture 2006  . Abdominal hysterectomy   . Back surgery 2009    Dr. Juliann [name] Nuerosurgeon  . Spine surgery     Family History  Problem Relation Age of Onset  . Heart defect      family history   . Breast cancer Mother     family history   . Hypertension Mother   . Diabetes Mother   . Hypertension Father   . Stroke Father   . Hypertension Sister   . Hypertension Brother   . Hypertension Brother   . Hypertension Brother   . Diabetes Sister   . Stroke Brother     History  Substance Use Topics  . Smoking status: Former Games developer  . Smokeless tobacco: Not on file  . Alcohol Use: No    OB History    Grav Para Term Preterm Abortions TAB SAB  Ect Mult Living                  Review of Systems  Constitutional: Negative for fatigue.  HENT: Negative for congestion, sinus pressure and ear discharge.   Eyes: Negative for discharge.  Respiratory: Negative for cough.   Cardiovascular: Negative for chest pain.  Gastrointestinal: Negative for abdominal pain and diarrhea.  Genitourinary: Negative for frequency and hematuria.  Musculoskeletal: Negative for back pain.  Skin: Negative for rash.  Neurological: Negative for seizures and headaches.  Hematological: Negative.   Psychiatric/Behavioral: Negative for hallucinations.  All other systems reviewed and are negative.  10 Systems reviewed and are negative for acute change except as noted in the HPI.   Allergies  Oxycodone; Penicillins; and Tramadol  Home Medications   Current Outpatient Rx  Name Route Sig Dispense Refill  . ALBUTEROL SULFATE HFA 108 (90 BASE) MCG/ACT IN AERS Inhalation Inhale 1-2 puffs into the lungs every 6 (six) hours as needed for wheezing. 1 Inhaler 0  . ALPRAZOLAM 0.25 MG PO TABS Oral Take 1 tablet (0.25 mg total) by mouth at bedtime. 30 tablet 3  . BENZONATATE 100 MG PO CAPS Oral Take 1 capsule (100 mg total) by mouth every 6 (six) hours as  needed for cough. 30 capsule 0  . CALCIUM CARBONATE-VITAMIN D 500-200 MG-UNIT PO TABS Oral Take 1 tablet by mouth 3 (three) times daily. 90 tablet 1  . CYCLOBENZAPRINE HCL 5 MG PO TABS Oral Take 1 tablet (5 mg total) by mouth every 8 (eight) hours as needed for muscle spasms. 42 tablet 1  . DOXYCYCLINE HYCLATE 100 MG PO CAPS Oral Take 1 capsule (100 mg total) by mouth 2 (two) times daily. 14 capsule 0  . HYDROCODONE-ACETAMINOPHEN 5-500 MG PO TABS Oral Take 1 tablet by mouth 2 (two) times daily as needed. 60 tablet 3  . PREDNISONE 10 MG PO TABS Oral Take 2 tablets (20 mg total) by mouth daily. 15 tablet 0  . SERTRALINE HCL 25 MG PO TABS Oral Take 1 tablet (25 mg total) by mouth daily. 30 tablet 4    BP 121/78  Pulse  107  Temp(Src) 98.7 F (37.1 C) (Oral)  Resp 18  Ht 5\' 4"  (1.626 m)  Wt 224 lb (101.606 kg)  BMI 38.45 kg/m2  SpO2 93%  Physical Exam  Nursing note and vitals reviewed. Constitutional: She is oriented to person, place, and time. She appears well-developed and well-nourished. No distress.  HENT:  Head: Normocephalic and atraumatic.  Eyes: Conjunctivae and EOM are normal. No scleral icterus.  Neck: Neck supple. No thyromegaly present.  Cardiovascular: Normal rate and regular rhythm.  Exam reveals no gallop and no friction rub.   No murmur heard. Pulmonary/Chest: No stridor. She has no wheezes (minimal ). She has no rales. She exhibits no tenderness.  Abdominal: She exhibits no distension. There is no tenderness. There is no rebound.  Musculoskeletal: Normal range of motion. She exhibits no edema.  Lymphadenopathy:    She has no cervical adenopathy.  Neurological: She is oriented to person, place, and time. Coordination normal.  Skin: No rash noted. No erythema.  Psychiatric: She has a normal mood and affect. Her behavior is normal.    ED Course  Procedures (including critical care time)  DIAGNOSTIC STUDIES: Oxygen Saturation is 96% on room air, normal by my interpretation.    COORDINATION OF CARE:  2:57PM Recheck: EDP discusses future treatment course and lab results. Pt is feeling better and ready for discharge.  Labs Reviewed  CBC - Abnormal; Notable for the following:    RBC 5.24 (*)    MCH 25.4 (*)    RDW 15.7 (*)    All other components within normal limits  DIFFERENTIAL - Abnormal; Notable for the following:    Neutrophils Relative 40 (*)    Monocytes Relative 13 (*)    All other components within normal limits  COMPREHENSIVE METABOLIC PANEL - Abnormal; Notable for the following:    Glucose, Bld 108 (*)    GFR calc non Af Amer 86 (*)    All other components within normal limits  POCT I-STAT TROPONIN I  I-STAT TROPONIN I   Dg Chest 2 View  12/04/2011   *RADIOLOGY REPORT*  Clinical Data: Chest pain and shortness of breath.  CHEST - 2 VIEW  Comparison: 04/22/2011  Findings: Heart size is normal.  Both lungs are clear.  No evidence of pleural effusion.  Tortuosity of thoracic aorta is stable.  IMPRESSION: Stable exam.  No active disease.  Original Report Authenticated By: Danae Orleans, [name]     1. Bronchitis   2. Bronchospasm       MDM        The chart was scribed for me under  my direct supervision.  I personally performed the history, physical, and medical decision making and all procedures in the evaluation of this patient.Kendra Lennert, MD 12/04/11 [phone]

## 2011-12-04 NOTE — ED Notes (Signed)
C/o left side chest pain with cough for few days, states cough is productive, generalized body aches

## 2011-12-04 NOTE — ED Notes (Signed)
Pt undressed and placed in a hospital gown. Pt was placed on the heart monitor. EKG completed and given to Dr.Zammit. NAD noted at this time.

## 2011-12-07 ENCOUNTER — Ambulatory Visit (HOSPITAL_COMMUNITY)
Admission: RE | Admit: 2011-12-07 | Discharge: 2011-12-07 | Disposition: A | Discharge status: Home / Self Care | Payer: Medicare Other | Source: Ambulatory Visit | Attending: Family Medicine | Admitting: Family Medicine

## 2011-12-07 DIAGNOSIS — R29898 Other symptoms and signs involving the musculoskeletal system: Secondary | ICD-10-CM

## 2011-12-07 NOTE — Progress Notes (Signed)
Physical Therapy Treatment Patient Details  Name: SEIDY LABRECK MRN: [id number] Date of Birth: 1934-06-23  Today's Date: 12/07/2011 Time: 4098-1191 Time Calculation (min): 43 min Visit#: 5  of 8   Re-eval: 12/09/11 Charges:  therex 40'    Subjective: Symptoms/Limitations Symptoms: Pt stated back pain feeling better, pt reported trip to ER on 12/05/2011 with infection in lungs. Pain Assessment Currently in Pain?: No/denies   Exercise/Treatments Stability ( Instructed by Becky Sax, PTA) Clam: Side-lying;10 reps Dead Bug: 10 reps Bridge: 15 reps Isometric Hip Flexion: 15 reps;5 seconds Straight Leg Raise: 15 reps Hip Abduction: Limitations;10 reps;5 seconds;Side-lying Hip Abduction Limitations: adduction with ball 10x 5" Heel Squeeze: 10 reps;5 seconds Single Arm Raise: Right;Left;Prone;10 reps;5 seconds Leg Raise: Prone;Right;Left;10 reps;3 seconds Functional Squats: 10 reps;3 seconds Lifting Limitations: seated IR/ER with diaphragmatic breathing  Physical Therapy Assessment and Plan PT Assessment and Plan Clinical Impression Statement: Majority of therex instructed by Becky Sax, PTA.  Pt. unalble to complete all therex/stretches or stay for ultrasound due to feeling progressively worse during therapy.  Pt began coughing/wheezing. PT Plan: Continue.  Re-evaluate next visit.    Problem List Patient Active Problem List  Diagnoses  . HYPERLIPIDEMIA  . OBESITY  . DEPRESSION  . UNSPECIFIED VISUAL LOSS  . ALLERGIC RHINITIS, SEASONAL  . DERMATITIS  . KNEE PAIN  . DEGENERATIVE DISC DISEASE, LUMBOSACRAL SPINE W/RADICULOPATHY  . NECK PAIN  . SPINAL STENOSIS  . SCIATICA  . BACK PAIN  . Trigger Finger (Acquired)  . FOOT PAIN, LEFT  . OTHER OSTEOPOROSIS  . DISORDER OF BONE AND CARTILAGE UNSPECIFIED  . FATIGUE  . HEADACHE  . CAROTID BRUIT  . SHORTNESS OF BREATH  . URINARY INCONTINENCE  . IMPAIRED FASTING GLUCOSE  . BLOOD IN STOOL, OCCULT  . Bronchitis,  acute  . Fall  . Right leg weakness    PT - End of Session Activity Tolerance: Patient tolerated treatment well General Behavior During Session: [facility] for tasks performed Cognition: [facility] for tasks performed  Emeline Gins B 12/07/2011, 10:47 AM

## 2011-12-09 ENCOUNTER — Ambulatory Visit (HOSPITAL_COMMUNITY)
Admission: RE | Admit: 2011-12-09 | Discharge: 2011-12-09 | Disposition: A | Discharge status: Home / Self Care | Payer: Medicare Other | Source: Ambulatory Visit | Attending: Family Medicine | Admitting: Family Medicine

## 2011-12-09 NOTE — Progress Notes (Signed)
Physical Therapy Treatment Patient Details  Name: [name] MRN: [id number] Date of Birth: 1934/11/25  Today's Date: 12/09/2011 Time: 4098-1191 Time Calculation (min): 38 min Visit#: 6  of 16   Re-eval: 01/06/12 Charges: Therex x 28' MMTx 1  Subjective: Symptoms/Limitations Symptoms: Pt reports that she just doesn't feel well today. Pain Assessment Currently in Pain?: Yes Pain Score:   7 Pain Location: Back Pain Orientation: Right;Lower  Objective:  12/09/11 0900  RLE Strength  Right Hip Flexion 3+/5  Right Hip Extension 4/5  Right Hip ABduction 4/5  Right Hip ADduction 4/5  Right Knee Flexion 5/5  Right Knee Extension 5/5  Right Ankle Dorsiflexion 5/5  LLE Strength  Left Hip Flexion 4/5  Left Hip Extension 4/5  Left Hip ABduction 5/5  Left Hip ADduction 5/5  Left Knee Flexion 5/5  Left Knee Extension 5/5  Left Ankle Dorsiflexion 5/5      Exercise/Treatments Stability Bridge: 15 reps Isometric Hip Flexion: 15 reps;5 seconds Straight Leg Raise: 15 reps Heel Squeeze: 10 reps;5 seconds Leg Raise: Prone;Right;Left;10 reps;3 seconds  Physical Therapy Assessment and Plan PT Assessment and Plan Clinical Impression Statement: Pt displays good gains in stength. Pt continues to have low back pain. Pt completes therex with good form. PT Plan: Recommend to PT to continue x4wks to continue to improve strength and decrease pain.    Goals Home Exercise Program Pt will Perform Home Exercise Program: Independently PT Short Term Goals Time to Complete Short Term Goals: 2 weeks PT Short Term Goal 1: Pain to be no greater than a 6 in patient's knee, no greater than a 5 in her back PT Short Term Goal 1 - Progress: Not met PT Short Term Goal 2: Patient to be able to stand for 15 min without pain to make a small meal PT Short Term Goal 2 - Progress: Not met PT Short Term Goal 3: Patient to be able to walk for 20 min to be able to complete some light housework. PT Short  Term Goal 3 - Progress: Other (comment) (Pt has not attempted) PT Long Term Goals Time to Complete Long Term Goals: 4 weeks PT Long Term Goal 1: I in advance HEP PT Long Term Goal 1 - Progress: Progressing toward goal PT Long Term Goal 2: Pain in knee no greater than a 3; pain in back no greater than a 2 PT Long Term Goal 2 - Progress: Not met Long Term Goal 3: able to stand for Long Term Goal 3 Progress: Not met Long Term Goal 4: able to walk without a cane for 20 min. Long Term Goal 4 Progress: Progressing toward goal PT Long Term Goal 5: able to sleep without pain medication. Long Term Goal 5 Progress: Partly met (Pt reprots that she can sleep w/o pain meds sometimes)  Problem List Patient Active Problem List  Diagnoses  . HYPERLIPIDEMIA  . OBESITY  . DEPRESSION  . UNSPECIFIED VISUAL LOSS  . ALLERGIC RHINITIS, SEASONAL  . DERMATITIS  . KNEE PAIN  . DEGENERATIVE DISC DISEASE, LUMBOSACRAL SPINE W/RADICULOPATHY  . NECK PAIN  . SPINAL STENOSIS  . SCIATICA  . BACK PAIN  . Trigger Finger (Acquired)  . FOOT PAIN, LEFT  . OTHER OSTEOPOROSIS  . DISORDER OF BONE AND CARTILAGE UNSPECIFIED  . FATIGUE  . HEADACHE  . CAROTID BRUIT  . SHORTNESS OF BREATH  . URINARY INCONTINENCE  . IMPAIRED FASTING GLUCOSE  . BLOOD IN STOOL, OCCULT  . Bronchitis, acute  . Fall  .  Right leg weakness    PT - End of Session Activity Tolerance: Patient tolerated treatment well General Behavior During Session: [facility] for tasks performed Cognition: [facility] for tasks performed  [name] 12/09/2011, 10:25 AM

## 2012-01-05 ENCOUNTER — Telehealth: Payer: Self-pay

## 2012-01-05 ENCOUNTER — Encounter: Payer: Self-pay | Admitting: Family Medicine

## 2012-01-05 NOTE — Telephone Encounter (Signed)
Has been having pain in her lower back and in her right hip area, goes up to her mid back. Has been hurting like this for sometime. Has been occasional but lately its been a constant pain. Wants to know what she needs to do for it. On a pain scale, its a 9. Advised to make appt to come in

## 2012-01-06 ENCOUNTER — Ambulatory Visit (INDEPENDENT_AMBULATORY_CARE_PROVIDER_SITE_OTHER): Payer: Medicare Other | Admitting: Family Medicine

## 2012-01-06 ENCOUNTER — Encounter: Payer: Self-pay | Admitting: Family Medicine

## 2012-01-06 VITALS — BP 130/80 | HR 82 | Resp 16 | Ht 64.0 in | Wt 227.8 lb

## 2012-01-06 DIAGNOSIS — M899 Disorder of bone, unspecified: Secondary | ICD-10-CM | POA: Diagnosis not present

## 2012-01-06 DIAGNOSIS — M549 Dorsalgia, unspecified: Secondary | ICD-10-CM

## 2012-01-06 DIAGNOSIS — R7301 Impaired fasting glucose: Secondary | ICD-10-CM

## 2012-01-06 DIAGNOSIS — M949 Disorder of cartilage, unspecified: Secondary | ICD-10-CM

## 2012-01-06 DIAGNOSIS — J209 Acute bronchitis, unspecified: Secondary | ICD-10-CM

## 2012-01-06 DIAGNOSIS — E669 Obesity, unspecified: Secondary | ICD-10-CM | POA: Diagnosis not present

## 2012-01-06 DIAGNOSIS — M5126 Other intervertebral disc displacement, lumbar region: Secondary | ICD-10-CM | POA: Diagnosis not present

## 2012-01-06 DIAGNOSIS — E785 Hyperlipidemia, unspecified: Secondary | ICD-10-CM | POA: Diagnosis not present

## 2012-01-06 DIAGNOSIS — R5381 Other malaise: Secondary | ICD-10-CM

## 2012-01-06 DIAGNOSIS — M25569 Pain in unspecified knee: Secondary | ICD-10-CM | POA: Diagnosis not present

## 2012-01-06 LAB — COMPREHENSIVE METABOLIC PANEL
ALT: 20 U/L (ref 0–35)
Albumin: 4.8 g/dL (ref 3.5–5.2)
CO2: 26 mEq/L (ref 19–32)
Chloride: 102 mEq/L (ref 96–112)
Potassium: 5.5 mEq/L — ABNORMAL HIGH (ref 3.5–5.3)
Sodium: 141 mEq/L (ref 135–145)
Total Bilirubin: 0.6 mg/dL (ref 0.3–1.2)
Total Protein: 8.2 g/dL (ref 6.0–8.3)

## 2012-01-06 LAB — LIPID PANEL
HDL: 71 mg/dL (ref >39)
LDL Cholesterol: 116 mg/dL — ABNORMAL HIGH (ref 0–99)

## 2012-01-06 LAB — HEMOGLOBIN A1C: Hgb A1c MFr Bld: 6.4 % — ABNORMAL HIGH (ref <5.7)

## 2012-01-06 NOTE — Progress Notes (Signed)
  Subjective:    Patient ID: [name], female    DOB: Jan 14, 1934, 78 y.o.   MRN: [id number]  HPI  Increased and uncontrolled back pain radiating to right lower extremity worsening, leg feels weak, knee is hurting more and more, and she reports instability in the joint. Was treated in the ED recently for lung infection, states her symptoms have totally resolved, no more cough and wheeze. Mammogram is due and she requests help with this Review of Systems See HPI Denies recent fever or chills. Denies sinus pressure, nasal congestion, ear pain or sore throat. Denies chest congestion, productive cough or wheezing. Denies chest pains, palpitations and leg swelling Denies abdominal pain, nausea, vomiting,diarrhea or constipation.   Denies dysuria, frequency, hesitancy or incontinence.  Denies uncontrolled depression, anxiety or insomnia. Denies skin break down or rash.       Objective:   Physical Exam  Patient alert and oriented and in no cardiopulmonary distress.  HEENT: No facial asymmetry, EOMI, no sinus tenderness,  oropharynx pink and moist.  Neck supple no adenopathy.  Chest: Clear to auscultation bilaterally.  CVS: S1, S2 no murmurs, no S3.  ABD: Soft non tender. Bowel sounds normal.  Ext: No edema  MS: decreased ROM spine,  and knee.  Skin: Intact, no ulcerations or rash noted.  Psych: Good eye contact, normal affect. Memory intact not anxious or depressed appearing.  CNS: CN 2-12 intact, power, normal throughout.       Assessment & Plan:

## 2012-01-06 NOTE — Assessment & Plan Note (Signed)
Deteriorating and uncontrolled, wants to see surgeon again, therapy and epidurals did not help

## 2012-01-06 NOTE — Patient Instructions (Addendum)
F/u in 3 month  You are referred to orthopedics about your knee, and also to the neurosurgeon to re evaluate uncontrolled back pain  You need  lipid, chem 7 hBA1C and vit d due  Today pls  You need your mammogram we will schedule

## 2012-01-06 NOTE — Assessment & Plan Note (Addendum)
Increased right knee pain instability, refer for further eval

## 2012-01-09 NOTE — Assessment & Plan Note (Signed)
Uncontrolled and deteriorated, has had surgery in the past , wants to see neurosurgeon, no interest in epidural injection atr this time, will defer imaging to specialist

## 2012-01-14 NOTE — Progress Notes (Signed)
Addended by: Abner Greenspan on: 01/14/2012 01:09 PM   Modules accepted: Orders

## 2012-01-14 NOTE — Progress Notes (Signed)
Mailed lab order to patient  

## 2012-01-19 DIAGNOSIS — T84498A Other mechanical complication of other internal orthopedic devices, implants and grafts, initial encounter: Secondary | ICD-10-CM | POA: Diagnosis not present

## 2012-01-24 ENCOUNTER — Telehealth: Payer: Self-pay | Admitting: Family Medicine

## 2012-01-24 NOTE — Telephone Encounter (Signed)
noted 

## 2012-01-25 ENCOUNTER — Encounter (HOSPITAL_BASED_OUTPATIENT_CLINIC_OR_DEPARTMENT_OTHER): Payer: Self-pay | Admitting: *Deleted

## 2012-01-25 ENCOUNTER — Other Ambulatory Visit: Payer: Self-pay | Admitting: Orthopedic Surgery

## 2012-01-25 NOTE — Progress Notes (Signed)
Sister to bring-do neb tx am of surg

## 2012-01-28 ENCOUNTER — Encounter (HOSPITAL_BASED_OUTPATIENT_CLINIC_OR_DEPARTMENT_OTHER): Payer: Self-pay | Admitting: Orthopedic Surgery

## 2012-01-28 ENCOUNTER — Encounter (HOSPITAL_BASED_OUTPATIENT_CLINIC_OR_DEPARTMENT_OTHER): Payer: Self-pay | Admitting: Certified Registered"

## 2012-01-28 ENCOUNTER — Ambulatory Visit (HOSPITAL_BASED_OUTPATIENT_CLINIC_OR_DEPARTMENT_OTHER)
Admission: RE | Admit: 2012-01-28 | Discharge: 2012-01-28 | Disposition: A | Discharge status: Home / Self Care | Payer: Medicare Other | Source: Ambulatory Visit | Attending: Orthopedic Surgery | Admitting: Orthopedic Surgery

## 2012-01-28 ENCOUNTER — Ambulatory Visit (HOSPITAL_BASED_OUTPATIENT_CLINIC_OR_DEPARTMENT_OTHER): Payer: Medicare Other | Admitting: Certified Registered"

## 2012-01-28 ENCOUNTER — Encounter (HOSPITAL_BASED_OUTPATIENT_CLINIC_OR_DEPARTMENT_OTHER)
Admission: RE | Disposition: A | Discharge status: Home / Self Care | Payer: Self-pay | Source: Ambulatory Visit | Attending: Orthopedic Surgery

## 2012-01-28 DIAGNOSIS — T84498A Other mechanical complication of other internal orthopedic devices, implants and grafts, initial encounter: Secondary | ICD-10-CM | POA: Diagnosis not present

## 2012-01-28 DIAGNOSIS — T85848A Pain due to other internal prosthetic devices, implants and grafts, initial encounter: Secondary | ICD-10-CM

## 2012-01-28 DIAGNOSIS — Z472 Encounter for removal of internal fixation device: Secondary | ICD-10-CM | POA: Diagnosis not present

## 2012-01-28 DIAGNOSIS — Y838 Other surgical procedures as the cause of abnormal reaction of the patient, or of later complication, without mention of misadventure at the time of the procedure: Secondary | ICD-10-CM | POA: Insufficient documentation

## 2012-01-28 HISTORY — DX: Depression, unspecified: F32.A

## 2012-01-28 HISTORY — DX: Pain due to other internal prosthetic devices, implants and grafts, initial encounter: T85.848A

## 2012-01-28 HISTORY — DX: Major depressive disorder, single episode, unspecified: F32.9

## 2012-01-28 HISTORY — PX: HARDWARE REMOVAL: SHX979

## 2012-01-28 HISTORY — DX: Shortness of breath: R06.02

## 2012-01-28 LAB — POCT HEMOGLOBIN-HEMACUE: Hemoglobin: 14.6 g/dL (ref 12.0–15.0)

## 2012-01-28 SURGERY — REMOVAL, HARDWARE
Anesthesia: General | Site: Knee | Laterality: Right | Wound class: Clean

## 2012-01-28 MED ORDER — ONDANSETRON HCL 4 MG/2ML IJ SOLN
INTRAMUSCULAR | Status: DC | PRN
  Administered 2012-01-28: 4 mg via INTRAVENOUS

## 2012-01-28 MED ORDER — LACTATED RINGERS IV SOLN
INTRAVENOUS | Status: DC
  Administered 2012-01-28: 14:00:00 via INTRAVENOUS

## 2012-01-28 MED ORDER — PROPOFOL 10 MG/ML IV EMUL
INTRAVENOUS | Status: DC | PRN
  Administered 2012-01-28: 200 mg via INTRAVENOUS
  Administered 2012-01-28: 50 mg via INTRAVENOUS

## 2012-01-28 MED ORDER — LACTATED RINGERS IV SOLN: INTRAVENOUS | Status: DC

## 2012-01-28 MED ORDER — 0.9 % SODIUM CHLORIDE (POUR BTL) OPTIME
TOPICAL | Status: DC | PRN
  Administered 2012-01-28: 1000 mL

## 2012-01-28 MED ORDER — PROMETHAZINE HCL 25 MG PO TABS: 25.0000 mg | ORAL_TABLET | Freq: Four times a day (QID) | ORAL | Status: AC | PRN

## 2012-01-28 MED ORDER — BUPIVACAINE HCL (PF) 0.25 % IJ SOLN
INTRAMUSCULAR | Status: DC | PRN
  Administered 2012-01-28: 20 mL

## 2012-01-28 MED ORDER — HYDROCODONE-ACETAMINOPHEN 10-325 MG PO TABS: 1.0000 | ORAL_TABLET | Freq: Four times a day (QID) | ORAL | Status: AC | PRN

## 2012-01-28 MED ORDER — CEFAZOLIN SODIUM 1-5 GM-% IV SOLN
1.0000 g | INTRAVENOUS | Status: AC
  Administered 2012-01-28: 2 g via INTRAVENOUS

## 2012-01-28 MED ORDER — FENTANYL CITRATE 0.05 MG/ML IJ SOLN
INTRAMUSCULAR | Status: DC | PRN
  Administered 2012-01-28: 100 ug via INTRAVENOUS
  Administered 2012-01-28: 25 ug via INTRAVENOUS

## 2012-01-28 MED ORDER — LACTATED RINGERS IV SOLN
INTRAVENOUS | Status: DC | PRN
  Administered 2012-01-28: 12:00:00 via INTRAVENOUS

## 2012-01-28 MED ORDER — HYDROMORPHONE HCL PF 1 MG/ML IJ SOLN
0.2500 mg | INTRAMUSCULAR | Status: DC | PRN
  Administered 2012-01-28 (×4): 0.25 mg via INTRAVENOUS

## 2012-01-28 MED ORDER — PROMETHAZINE HCL 25 MG/ML IJ SOLN
6.2500 mg | INTRAMUSCULAR | Status: DC | PRN
  Administered 2012-01-28: 6.25 mg via INTRAVENOUS

## 2012-01-28 MED ORDER — ALBUTEROL SULFATE (2.5 MG/3ML) 0.083% IN NEBU
2.5000 mg | INHALATION_SOLUTION | RESPIRATORY_TRACT | Status: AC
  Administered 2012-01-28: 2.5 mg via RESPIRATORY_TRACT

## 2012-01-28 SURGICAL SUPPLY — 58 items
APL SKNCLS STERI-STRIP NONHPOA (GAUZE/BANDAGES/DRESSINGS) ×1
BAG DECANTER FOR FLEXI CONT (MISCELLANEOUS) IMPLANT
BANDAGE ELASTIC 6 VELCRO ST LF (GAUZE/BANDAGES/DRESSINGS) ×2 IMPLANT
BANDAGE ESMARK 6X9 LF (GAUZE/BANDAGES/DRESSINGS) ×1 IMPLANT
BANDAGE GAUZE ELAST BULKY 4 IN (GAUZE/BANDAGES/DRESSINGS) ×1 IMPLANT
BENZOIN TINCTURE PRP APPL 2/3 (GAUZE/BANDAGES/DRESSINGS) ×1 IMPLANT
BLADE SURG 15 STRL LF DISP TIS (BLADE) ×1 IMPLANT
BLADE SURG 15 STRL SS (BLADE) ×4
BNDG CMPR 9X6 STRL LF SNTH (GAUZE/BANDAGES/DRESSINGS) ×1
BNDG ESMARK 6X9 LF (GAUZE/BANDAGES/DRESSINGS) ×2
CANISTER SUCTION 1200CC (MISCELLANEOUS) ×1 IMPLANT
CLOTH BEACON ORANGE TIMEOUT ST (SAFETY) ×1 IMPLANT
CUFF TOURNIQUET SINGLE 34IN LL (TOURNIQUET CUFF) ×1 IMPLANT
DECANTER SPIKE VIAL GLASS SM (MISCELLANEOUS) IMPLANT
DRAPE EXTREMITY T 121X128X90 (DRAPE) ×2 IMPLANT
DRAPE INCISE IOBAN 66X45 STRL (DRAPES) ×1 IMPLANT
DRAPE U-SHAPE 47X51 STRL (DRAPES) ×2 IMPLANT
DURAPREP 26ML APPLICATOR (WOUND CARE) ×2 IMPLANT
ELECT REM PT RETURN 9FT ADLT (ELECTROSURGICAL) ×2
ELECTRODE REM PT RTRN 9FT ADLT (ELECTROSURGICAL) ×1 IMPLANT
GAUZE XEROFORM 1X8 LF (GAUZE/BANDAGES/DRESSINGS) IMPLANT
GLOVE BIO SURGEON STRL SZ7 (GLOVE) ×1 IMPLANT
GLOVE BIOGEL PI IND STRL 8 (GLOVE) ×2 IMPLANT
GLOVE BIOGEL PI INDICATOR 8 (GLOVE) ×2
GLOVE ORTHO TXT STRL SZ7.5 (GLOVE) ×2 IMPLANT
GLOVE SURG ORTHO 8.0 STRL STRW (GLOVE) ×2 IMPLANT
GOWN PREVENTION PLUS XLARGE (GOWN DISPOSABLE) ×3 IMPLANT
GOWN PREVENTION PLUS XXLARGE (GOWN DISPOSABLE) ×2 IMPLANT
IMMOBILIZER KNEE 24 THIGH 36 (MISCELLANEOUS) ×1 IMPLANT
IMMOBILIZER KNEE 24 UNIV (MISCELLANEOUS)
NDL SUT 6 .5 CRC .975X.05 MAYO (NEEDLE) IMPLANT
NEEDLE MAYO TAPER (NEEDLE)
NS IRRIG 1000ML POUR BTL (IV SOLUTION) ×2 IMPLANT
PACK ARTHROSCOPY DSU (CUSTOM PROCEDURE TRAY) ×2 IMPLANT
PACK BASIN DAY SURGERY FS (CUSTOM PROCEDURE TRAY) ×2 IMPLANT
PADDING CAST COTTON 6X4 STRL (CAST SUPPLIES) ×2 IMPLANT
PENCIL BUTTON HOLSTER BLD 10FT (ELECTRODE) ×2 IMPLANT
SHEET MEDIUM DRAPE 40X70 STRL (DRAPES) ×2 IMPLANT
SLEEVE SCD COMPRESS KNEE MED (MISCELLANEOUS) ×2 IMPLANT
SPONGE GAUZE 4X4 12PLY (GAUZE/BANDAGES/DRESSINGS) ×2 IMPLANT
SPONGE LAP 4X18 X RAY DECT (DISPOSABLE) ×1 IMPLANT
STAPLER VISISTAT (STAPLE) IMPLANT
STAPLER VISISTAT 35W (STAPLE) IMPLANT
STRIP CLOSURE SKIN 1/2X4 (GAUZE/BANDAGES/DRESSINGS) ×1 IMPLANT
SUCTION FRAZIER TIP 10 FR DISP (SUCTIONS) ×1 IMPLANT
SUT MNCRL AB 4-0 PS2 18 (SUTURE) IMPLANT
SUT VIC AB 0 CT1 27 (SUTURE)
SUT VIC AB 0 CT1 27XBRD ANBCTR (SUTURE) IMPLANT
SUT VIC AB 0 SH 27 (SUTURE) IMPLANT
SUT VIC AB 2-0 SH 27 (SUTURE) ×2
SUT VIC AB 2-0 SH 27XBRD (SUTURE) ×1 IMPLANT
SUT VICRYL 3-0 CR8 SH (SUTURE) ×2 IMPLANT
SYR BULB 3OZ (MISCELLANEOUS) ×1 IMPLANT
TOWEL OR 17X24 6PK STRL BLUE (TOWEL DISPOSABLE) ×2 IMPLANT
TUBE CONNECTING 20X1/4 (TUBING) ×1 IMPLANT
UNDERPAD 30X30 INCONTINENT (UNDERPADS AND DIAPERS) ×2 IMPLANT
WATER STERILE IRR 1000ML POUR (IV SOLUTION) ×1 IMPLANT
YANKAUER SUCT BULB TIP NO VENT (SUCTIONS) IMPLANT

## 2012-01-28 NOTE — Anesthesia Procedure Notes (Signed)
Procedure Name: LMA Insertion Date/Time: 01/28/2012 12:45 PM Performed by: Radford Pax Pre-anesthesia Checklist: Patient identified, Emergency Drugs available, Suction available, Patient being monitored and Timeout performed Patient Re-evaluated:Patient Re-evaluated prior to inductionOxygen Delivery Method: Circle System Utilized Preoxygenation: Pre-oxygenation with 100% oxygen Intubation Type: IV induction Ventilation: Mask ventilation without difficulty LMA: LMA with gastric port inserted LMA Size: 4.0 Number of attempts: 1 (atraumatic) Placement Confirmation: positive ETCO2 Tube secured with: Tape Dental Injury: Teeth and Oropharynx as per pre-operative assessment  Comments: Patient's dentition in poor condition. Majority of  #7 broken off at gumline very thin partial labial wall remains.

## 2012-01-28 NOTE — H&P (Signed)
PREOPERATIVE H&P  Chief Complaint: right knee mech complications internal ortho device  HPI: [name] is a 78 y.o. female who presents for preoperative history and physical with a diagnosis of right knee mech complications internal ortho device. She had a previous patella fracture, and has had broken cerclage wires that are symptomatic. She wants to have them removed. Symptoms are rated as moderate to severe, and have been worsening.  This is significantly impairing activities of daily living.  She has elected for surgical management.   Past Medical History  Diagnosis Date  . High blood pressure   . High cholesterol   . Headache   . Urinary incontinence   . Obesity   . Hyperlipidemia   . Hypertension   . Sciatica   . Back pain   . Family history of arthritis   . Family history of ischemic heart disease   . Family history of diabetes mellitus   . Personal history of unspecified circulatory disease   . Shortness of breath   . Asthma   . Depression    Past Surgical History  Procedure Date  . Right knee surgery after fracture 2006  . Abdominal hysterectomy   . Back surgery 2009    Dr. Juliann Pares Nuerosurgeon  . Spine surgery   . Colonoscopy    History   Social History  . Marital Status: Widowed    Spouse Name: N/A    Number of Children: 1  . Years of Education: N/A   Occupational History  . retired     Social History Main Topics  . Smoking status: Former Smoker    Types: Cigarettes  . Smokeless tobacco: Former Neurosurgeon    Quit date: 01/24/1991  . Alcohol Use: No  . Drug Use: No  . Sexually Active: None   Other Topics Concern  . None   Social History Narrative  . None   Family History  Problem Relation Age of Onset  . Heart defect      family history   . Breast cancer Mother     family history   . Hypertension Mother   . Diabetes Mother   . Hypertension Father   . Stroke Father   . Hypertension Sister   . Hypertension Brother   .  Hypertension Brother   . Hypertension Brother   . Diabetes Sister   . Stroke Brother    Allergies  Allergen Reactions  . Oxycodone   . Penicillins   . Tramadol    Prior to Admission medications   Medication Sig Start Date End Date Taking? Authorizing Provider  albuterol (PROVENTIL HFA;VENTOLIN HFA) 108 (90 BASE) MCG/ACT inhaler Inhale 1-2 puffs into the lungs every 6 (six) hours as needed for wheezing. 12/04/11 12/03/12  [name], MD  ALPRAZolam Prudy Feeler) 0.25 MG tablet Take 1 tablet (0.25 mg total) by mouth at bedtime. 10/25/11   Syliva Overman, MD  benzonatate (TESSALON PERLES) 100 MG capsule Take 1 capsule (100 mg total) by mouth every 6 (six) hours as needed for cough. 10/25/11 10/24/12  Syliva Overman, MD  calcium-vitamin D (OSCAL 500/200 D-3) 500-200 MG-UNIT per tablet Take 1 tablet by mouth 3 (three) times daily. 04/26/11   Syliva Overman, MD  cyclobenzaprine (FLEXERIL) 5 MG tablet Take 1 tablet (5 mg total) by mouth every 8 (eight) hours as needed for muscle spasms. 10/25/11 10/24/12  Syliva Overman, MD  HYDROcodone-acetaminophen (VICODIN) 5-500 MG per tablet Take 1 tablet by mouth 2 (two) times daily as needed.  10/25/11   Syliva Overman, MD  predniSONE (DELTASONE) 10 MG tablet Take 2 tablets (20 mg total) by mouth daily. 12/04/11   [name], MD  sertraline (ZOLOFT) 25 MG tablet Take 1 tablet (25 mg total) by mouth daily. 10/25/11 10/24/12  Syliva Overman, MD     Positive ROS: All other systems have been reviewed and were otherwise negative with the exception of those mentioned in the HPI and as above.  Physical Exam: General: Alert, no acute distress Cardiovascular: No pedal edema Respiratory: No cyanosis, no use of accessory musculature GI: No organomegaly, abdomen is soft and non-tender Skin: No lesions in the area of chief complaint Neurologic: Sensation intact distally Psychiatric: Patient is competent for consent with normal mood and  affect Lymphatic: No axillary or cervical lymphadenopathy  MUSCULOSKELETAL: Right knee has well-healed surgical wound. She has no pain to palpation over her patella with the exception of over the prominent hardware.  Assessment: right knee broken hardware with cerclage wires that are prominent and symptomatic  Plan: Plan for Procedure(s): HARDWARE REMOVAL  The risks benefits and alternatives were discussed with the patient including but not limited to the risks of nonoperative treatment, versus surgical intervention including infection, bleeding, nerve injury,  blood clots, cardiopulmonary complications, morbidity, mortality, among others, and they were willing to proceed.   Eulas Post, MD 01/28/2012 7:38 AM

## 2012-01-28 NOTE — Anesthesia Postprocedure Evaluation (Signed)
  Anesthesia Post-op Note  Patient: [name]  Procedure(s) Performed:  HARDWARE REMOVAL - Right Knee  Patient Location: PACU  Anesthesia Type: General  Level of Consciousness: awake, alert  and oriented  Airway and Oxygen Therapy: Patient Spontanous Breathing  Post-op Pain: none  Post-op Assessment: Post-op Vital signs reviewed, Patient's Cardiovascular Status Stable, Respiratory Function Stable, Patent Airway, No signs of Nausea or vomiting and Pain level controlled  Post-op Vital Signs: Reviewed and stable  Complications: No apparent anesthesia complications

## 2012-01-28 NOTE — Op Note (Signed)
01/28/2012  1:25 PM  PATIENT:  [name]    PRE-OPERATIVE DIAGNOSIS:  Prominent Hardware Right Knee  POST-OPERATIVE DIAGNOSIS:  Same  PROCEDURE:  HARDWARE REMOVAL  SURGEON:  Eulas Post, MD  PHYSICIAN ASSISTANT: Janace Litten, OPA-C, present and scrubbed throughout the case, critical for completion in a timely fashion, and for retraction, instrumentation, and closure.  ANESTHESIA:   General  PREOPERATIVE INDICATIONS:  [name] is a  77 y.o. female with a diagnosis of Prominent Hardware Right Knee who failed conservative measures and elected for surgical management.  She had a previous right patella fracture that was fixed with a tension band and K wires, and the patella healed, although the tension band and wires broke.  The risks benefits and alternatives were discussed with the patient preoperatively including but not limited to the risks of infection, bleeding, nerve injury, cardiopulmonary complications, the need for revision surgery, among others, and the patient was willing to proceed.  OPERATIVE IMPLANTS: None. Multiple broken pieces of wire were removed.  OPERATIVE FINDINGS: The patella moved as a unit, and appeared to have healed in appropriate position.  OPERATIVE PROCEDURE: The patient is brought to the operating room and placed in the supine position. Time out was performed. General anesthesia was administered. The right lower extremity was prepped and draped in usual sterile fashion. Antibiotics were given. Incision was made through her previous anterior incision. The hardware was exposed, piece by piece and removed. Care was taken to minimize damage to the extensor mechanism both on the quadriceps and the patellar tendon side. There was also some nonabsorbable suture which was removed. All hardware was removed without difficulty. C-arm was used to confirm that all of the broken pieces of wire were removed. I irrigated the wound copiously and repaired the  subcutaneous tissue with Vicryl followed by Steri-Strips and sterile gauze for the skin. She was awakened and returned to the PACU in stable and satisfactory condition. There were no complications and she tolerated the procedure well.

## 2012-01-28 NOTE — Transfer of Care (Signed)
Immediate Anesthesia Transfer of Care Note  Patient: [name]  Procedure(s) Performed:  HARDWARE REMOVAL - Right Knee  Patient Location: PACU  Anesthesia Type: General  Level of Consciousness: awake, alert , oriented and patient cooperative  Airway & Oxygen Therapy: Patient Spontanous Breathing and Patient connected to face mask oxygen  Post-op Assessment: Report given to PACU RN and Post -op Vital signs reviewed and stable  Post vital signs: Reviewed and stable  Complications: No apparent anesthesia complications

## 2012-01-28 NOTE — Anesthesia Preprocedure Evaluation (Signed)
Anesthesia Evaluation  Patient identified by MRN, date of birth, ID band Patient awake    Reviewed: Allergy & Precautions, H&P , NPO status , Patient's Chart, lab work & pertinent test results  History of Anesthesia Complications Negative for: history of anesthetic complications  Airway Mallampati: II TM Distance: >3 FB Neck ROM: Full    Dental  (+) Partial Lower, Partial Upper, Missing and Dental Advisory Given   Pulmonary shortness of breath and with exertion, asthma (occasionally uses inhalers, has not needed in weeks) , former smoker clear to auscultation  Pulmonary exam normal       Cardiovascular Regular Normal    Neuro/Psych Depression    GI/Hepatic negative GI ROS, Neg liver ROS,   Endo/Other  Morbid obesity  Renal/GU negative Renal ROS     Musculoskeletal  (+) Arthritis -,   Abdominal (+) obese,   Peds  Hematology   Anesthesia Other Findings   Reproductive/Obstetrics                           Anesthesia Physical Anesthesia Plan  ASA: III  Anesthesia Plan: General   Post-op Pain Management:    Induction: Intravenous  Airway Management Planned: LMA  Additional Equipment:   Intra-op Plan:   Post-operative Plan:   Informed Consent: I have reviewed the patients History and Physical, chart, labs and discussed the procedure including the risks, benefits and alternatives for the proposed anesthesia with the patient or authorized representative who has indicated his/her understanding and acceptance.   Dental advisory given  Plan Discussed with: CRNA and Surgeon  Anesthesia Plan Comments: (Plan routine monitors, GA- LMA OK)        Anesthesia Quick Evaluation

## 2012-02-01 DIAGNOSIS — T8489XA Other specified complication of internal orthopedic prosthetic devices, implants and grafts, initial encounter: Secondary | ICD-10-CM | POA: Diagnosis not present

## 2012-02-01 DIAGNOSIS — R32 Unspecified urinary incontinence: Secondary | ICD-10-CM | POA: Diagnosis not present

## 2012-02-01 DIAGNOSIS — Z4789 Encounter for other orthopedic aftercare: Secondary | ICD-10-CM | POA: Diagnosis not present

## 2012-02-02 ENCOUNTER — Encounter (HOSPITAL_BASED_OUTPATIENT_CLINIC_OR_DEPARTMENT_OTHER): Payer: Self-pay | Admitting: Orthopedic Surgery

## 2012-02-04 DIAGNOSIS — R32 Unspecified urinary incontinence: Secondary | ICD-10-CM | POA: Diagnosis not present

## 2012-02-04 DIAGNOSIS — Z4789 Encounter for other orthopedic aftercare: Secondary | ICD-10-CM | POA: Diagnosis not present

## 2012-02-04 DIAGNOSIS — T8489XA Other specified complication of internal orthopedic prosthetic devices, implants and grafts, initial encounter: Secondary | ICD-10-CM | POA: Diagnosis not present

## 2012-02-08 DIAGNOSIS — R32 Unspecified urinary incontinence: Secondary | ICD-10-CM | POA: Diagnosis not present

## 2012-02-08 DIAGNOSIS — Z4789 Encounter for other orthopedic aftercare: Secondary | ICD-10-CM | POA: Diagnosis not present

## 2012-02-08 DIAGNOSIS — T8489XA Other specified complication of internal orthopedic prosthetic devices, implants and grafts, initial encounter: Secondary | ICD-10-CM | POA: Diagnosis not present

## 2012-02-09 DIAGNOSIS — R32 Unspecified urinary incontinence: Secondary | ICD-10-CM | POA: Diagnosis not present

## 2012-02-09 DIAGNOSIS — T8489XA Other specified complication of internal orthopedic prosthetic devices, implants and grafts, initial encounter: Secondary | ICD-10-CM | POA: Diagnosis not present

## 2012-02-09 DIAGNOSIS — Z4789 Encounter for other orthopedic aftercare: Secondary | ICD-10-CM | POA: Diagnosis not present

## 2012-02-10 DIAGNOSIS — T84498A Other mechanical complication of other internal orthopedic devices, implants and grafts, initial encounter: Secondary | ICD-10-CM | POA: Diagnosis not present

## 2012-02-11 DIAGNOSIS — Z4789 Encounter for other orthopedic aftercare: Secondary | ICD-10-CM | POA: Diagnosis not present

## 2012-02-11 DIAGNOSIS — T8489XA Other specified complication of internal orthopedic prosthetic devices, implants and grafts, initial encounter: Secondary | ICD-10-CM | POA: Diagnosis not present

## 2012-02-11 DIAGNOSIS — R32 Unspecified urinary incontinence: Secondary | ICD-10-CM | POA: Diagnosis not present

## 2012-02-16 DIAGNOSIS — T8489XA Other specified complication of internal orthopedic prosthetic devices, implants and grafts, initial encounter: Secondary | ICD-10-CM | POA: Diagnosis not present

## 2012-02-16 DIAGNOSIS — Z4789 Encounter for other orthopedic aftercare: Secondary | ICD-10-CM | POA: Diagnosis not present

## 2012-02-16 DIAGNOSIS — R32 Unspecified urinary incontinence: Secondary | ICD-10-CM | POA: Diagnosis not present

## 2012-02-17 DIAGNOSIS — T8489XA Other specified complication of internal orthopedic prosthetic devices, implants and grafts, initial encounter: Secondary | ICD-10-CM | POA: Diagnosis not present

## 2012-02-17 DIAGNOSIS — R32 Unspecified urinary incontinence: Secondary | ICD-10-CM | POA: Diagnosis not present

## 2012-02-17 DIAGNOSIS — Z4789 Encounter for other orthopedic aftercare: Secondary | ICD-10-CM | POA: Diagnosis not present

## 2012-02-18 DIAGNOSIS — R32 Unspecified urinary incontinence: Secondary | ICD-10-CM | POA: Diagnosis not present

## 2012-02-18 DIAGNOSIS — Z4789 Encounter for other orthopedic aftercare: Secondary | ICD-10-CM | POA: Diagnosis not present

## 2012-02-18 DIAGNOSIS — T8489XA Other specified complication of internal orthopedic prosthetic devices, implants and grafts, initial encounter: Secondary | ICD-10-CM | POA: Diagnosis not present

## 2012-02-21 DIAGNOSIS — R32 Unspecified urinary incontinence: Secondary | ICD-10-CM | POA: Diagnosis not present

## 2012-02-21 DIAGNOSIS — T8489XA Other specified complication of internal orthopedic prosthetic devices, implants and grafts, initial encounter: Secondary | ICD-10-CM | POA: Diagnosis not present

## 2012-02-21 DIAGNOSIS — Z4789 Encounter for other orthopedic aftercare: Secondary | ICD-10-CM | POA: Diagnosis not present

## 2012-02-25 DIAGNOSIS — T8489XA Other specified complication of internal orthopedic prosthetic devices, implants and grafts, initial encounter: Secondary | ICD-10-CM | POA: Diagnosis not present

## 2012-02-25 DIAGNOSIS — Z4789 Encounter for other orthopedic aftercare: Secondary | ICD-10-CM | POA: Diagnosis not present

## 2012-02-25 DIAGNOSIS — R32 Unspecified urinary incontinence: Secondary | ICD-10-CM | POA: Diagnosis not present

## 2012-03-22 ENCOUNTER — Ambulatory Visit: Payer: Medicare Other | Admitting: Family Medicine

## 2012-04-10 ENCOUNTER — Ambulatory Visit (INDEPENDENT_AMBULATORY_CARE_PROVIDER_SITE_OTHER): Payer: Medicare Other | Admitting: Family Medicine

## 2012-04-10 ENCOUNTER — Encounter: Payer: Self-pay | Admitting: Family Medicine

## 2012-04-10 VITALS — BP 130/82 | HR 97 | Resp 18 | Ht 64.0 in | Wt 223.1 lb

## 2012-04-10 DIAGNOSIS — R5381 Other malaise: Secondary | ICD-10-CM

## 2012-04-10 DIAGNOSIS — R7301 Impaired fasting glucose: Secondary | ICD-10-CM

## 2012-04-10 DIAGNOSIS — E559 Vitamin D deficiency, unspecified: Secondary | ICD-10-CM | POA: Insufficient documentation

## 2012-04-10 DIAGNOSIS — M5126 Other intervertebral disc displacement, lumbar region: Secondary | ICD-10-CM

## 2012-04-10 DIAGNOSIS — H6692 Otitis media, unspecified, left ear: Secondary | ICD-10-CM | POA: Insufficient documentation

## 2012-04-10 DIAGNOSIS — R5383 Other fatigue: Secondary | ICD-10-CM

## 2012-04-10 DIAGNOSIS — H6690 Otitis media, unspecified, unspecified ear: Secondary | ICD-10-CM | POA: Diagnosis not present

## 2012-04-10 DIAGNOSIS — F329 Major depressive disorder, single episode, unspecified: Secondary | ICD-10-CM

## 2012-04-10 DIAGNOSIS — E669 Obesity, unspecified: Secondary | ICD-10-CM

## 2012-04-10 MED ORDER — ALPRAZOLAM 0.25 MG PO TABS: 0.2500 mg | ORAL_TABLET | Freq: Every day | ORAL | Status: DC

## 2012-04-10 MED ORDER — SULFAMETHOXAZOLE-TRIMETHOPRIM 800-160 MG PO TABS: 1.0000 | ORAL_TABLET | Freq: Two times a day (BID) | ORAL | Status: AC

## 2012-04-10 MED ORDER — ERGOCALCIFEROL 1.25 MG (50000 UT) PO CAPS: 50000.0000 [IU] | ORAL_CAPSULE | ORAL | Status: DC

## 2012-04-10 MED ORDER — HYDROCODONE-ACETAMINOPHEN 5-500 MG PO TABS: 1.0000 | ORAL_TABLET | Freq: Two times a day (BID) | ORAL | Status: DC | PRN

## 2012-04-10 MED ORDER — SERTRALINE HCL 25 MG PO TABS: 25.0000 mg | ORAL_TABLET | Freq: Every day | ORAL | Status: DC

## 2012-04-10 NOTE — Patient Instructions (Signed)
F/u in 4 month  Septra is sent in for left ear infection.  You need weekly vit D for 6 month, this is prescribed.  Labs today, HBA1C, tSH and chem 7   Medication will be refilled as requested.

## 2012-04-10 NOTE — Progress Notes (Signed)
  Subjective:    Patient ID: [name], female    DOB: 1934-02-20, 78 y.o.   MRN: [id number]  HPI The PT is here for follow up and re-evaluation of chronic medical conditions, medication management and review of any available recent lab and radiology data.  Preventive health is updated, specifically  Cancer screening and Immunization.   Questions or concerns regarding consultations or procedures which the PT has had in the interim are  addressed. The PT denies any adverse reactions to current medications since the last visit.  C/o pressure and drainage from left ear for the past 5 days    Review of Systems See HPI Denies recent fever or chills. Denies sinus pressure, nasal congestion,or sore throat. Denies chest congestion, productive cough or wheezing. Denies chest pains, palpitations and leg swelling Denies abdominal pain, nausea, vomiting,diarrhea or constipation.   Denies dysuria, frequency, hesitancy or incontinence. Chronic back pain with  limitation in mobility. Denies headaches, seizures, numbness, or tingling. Denies uncontrolled  depression, anxiety or insomnia. Denies skin break down or rash.        Objective:   Physical Exam  Patient alert and oriented and in no cardiopulmonary distress.  HEENT: No facial asymmetry, EOMI, no sinus tenderness,  oropharynx pink and moist.  Neck supple no adenopathy.Left TM erythematous and dull  Chest: Clear to auscultation bilaterally.  CVS: S1, S2 no murmurs, no S3.  ABD: Soft non tender. Bowel sounds normal.  Ext: No edema  MS: decreased ROM spine, hips shoulders and knees  Skin: Intact, no ulcerations or rash noted.  Psych: Good eye contact, normal affect. Memory intact not anxious or depressed appearing.  CNS: CN 2-12 intact, power, tone and sensation normal throughout.      Assessment & Plan:

## 2012-04-20 ENCOUNTER — Telehealth: Payer: Self-pay | Admitting: Family Medicine

## 2012-04-20 MED ORDER — HYDROCODONE-ACETAMINOPHEN 5-500 MG PO TABS: 1.0000 | ORAL_TABLET | Freq: Two times a day (BID) | ORAL | Status: DC | PRN

## 2012-04-20 NOTE — Telephone Encounter (Signed)
Resent in again. States she did not get

## 2012-04-30 NOTE — Assessment & Plan Note (Signed)
Acute infection antibiotics prescribed 

## 2012-04-30 NOTE — Assessment & Plan Note (Signed)
Controlled, no change in medication  

## 2012-04-30 NOTE — Assessment & Plan Note (Signed)
Importance of taking supplements till corrected to help with bone health is strssed

## 2012-04-30 NOTE — Assessment & Plan Note (Signed)
Unchanged pt maintained on chronic medication for pain

## 2012-04-30 NOTE — Assessment & Plan Note (Signed)
Deteriorated. Patient re-educated about  the importance of commitment to a  minimum of 150 minutes of exercise per week. The importance of healthy food choices with portion control discussed. Encouraged to start a food diary, count calories and to consider  joining a support group. Sample diet sheets offered. Goals set by the patient for the next several months.    

## 2012-07-10 ENCOUNTER — Encounter: Payer: Self-pay | Admitting: Family Medicine

## 2012-07-10 ENCOUNTER — Ambulatory Visit (INDEPENDENT_AMBULATORY_CARE_PROVIDER_SITE_OTHER): Payer: Medicare Other | Admitting: Family Medicine

## 2012-07-10 VITALS — BP 150/90 | HR 81 | Resp 16 | Ht 64.0 in | Wt 228.0 lb

## 2012-07-10 DIAGNOSIS — J40 Bronchitis, not specified as acute or chronic: Secondary | ICD-10-CM | POA: Diagnosis not present

## 2012-07-10 DIAGNOSIS — I10 Essential (primary) hypertension: Secondary | ICD-10-CM | POA: Diagnosis not present

## 2012-07-10 DIAGNOSIS — E785 Hyperlipidemia, unspecified: Secondary | ICD-10-CM

## 2012-07-10 DIAGNOSIS — F329 Major depressive disorder, single episode, unspecified: Secondary | ICD-10-CM | POA: Diagnosis not present

## 2012-07-10 DIAGNOSIS — R0602 Shortness of breath: Secondary | ICD-10-CM

## 2012-07-10 DIAGNOSIS — E669 Obesity, unspecified: Secondary | ICD-10-CM

## 2012-07-10 DIAGNOSIS — F3289 Other specified depressive episodes: Secondary | ICD-10-CM

## 2012-07-10 DIAGNOSIS — R7301 Impaired fasting glucose: Secondary | ICD-10-CM

## 2012-07-10 DIAGNOSIS — R5381 Other malaise: Secondary | ICD-10-CM

## 2012-07-10 DIAGNOSIS — R5383 Other fatigue: Secondary | ICD-10-CM

## 2012-07-10 DIAGNOSIS — M549 Dorsalgia, unspecified: Secondary | ICD-10-CM

## 2012-07-10 MED ORDER — AMLODIPINE BESYLATE 2.5 MG PO TABS: 2.5000 mg | ORAL_TABLET | Freq: Every day | ORAL | Status: DC

## 2012-07-10 MED ORDER — BENZONATATE 100 MG PO CAPS: 100.0000 mg | ORAL_CAPSULE | Freq: Four times a day (QID) | ORAL | Status: DC | PRN

## 2012-07-10 MED ORDER — SERTRALINE HCL 25 MG PO TABS: 25.0000 mg | ORAL_TABLET | Freq: Every day | ORAL | Status: DC

## 2012-07-10 MED ORDER — SULFAMETHOXAZOLE-TRIMETHOPRIM 400-80 MG PO TABS: 1.0000 | ORAL_TABLET | Freq: Two times a day (BID) | ORAL | Status: DC

## 2012-07-10 NOTE — Patient Instructions (Addendum)
Annual wellness exam in 2.5 month  You are being referred for a chest scan due increased shortness of breath and cough, and also you need your mammogram.  Weight loss and gentle exercise as tolerated, will help your back pain  hBA1C and chem 7 today.  New medication for blood pressure, which is high and also for depression are started   Antibiotic and decongestants sent to your pharmacy  For cough and head congestion

## 2012-07-13 ENCOUNTER — Telehealth: Payer: Self-pay | Admitting: Family Medicine

## 2012-07-13 ENCOUNTER — Other Ambulatory Visit: Payer: Self-pay

## 2012-07-13 MED ORDER — ERGOCALCIFEROL 1.25 MG (50000 UT) PO CAPS: 50000.0000 [IU] | ORAL_CAPSULE | ORAL | Status: DC

## 2012-07-13 MED ORDER — HYDROCODONE-ACETAMINOPHEN 5-500 MG PO TABS: 1.0000 | ORAL_TABLET | Freq: Two times a day (BID) | ORAL | Status: DC | PRN

## 2012-07-13 MED ORDER — ALPRAZOLAM 0.25 MG PO TABS: 0.2500 mg | ORAL_TABLET | Freq: Every day | ORAL | Status: DC

## 2012-07-13 NOTE — Telephone Encounter (Signed)
meds refilled 

## 2012-07-18 ENCOUNTER — Other Ambulatory Visit: Payer: Self-pay

## 2012-07-18 MED ORDER — ALBUTEROL SULFATE HFA 108 (90 BASE) MCG/ACT IN AERS: 1.0000 | INHALATION_SPRAY | Freq: Four times a day (QID) | RESPIRATORY_TRACT | Status: DC | PRN

## 2012-07-23 NOTE — Assessment & Plan Note (Signed)
Uncontrolled, needs to resume medication DASH diet and commitment to daily physical activity for a minimum of 30 minutes discussed and encouraged, as a part of hypertension management. The importance of attaining a healthy weight is also discussed.  

## 2012-07-23 NOTE — Assessment & Plan Note (Signed)
Antibiotics and decongestants prescribed, also scan ordered for evaluation of worsening dyspnea

## 2012-07-23 NOTE — Assessment & Plan Note (Signed)
Increased and uncontrolled, medication prescribed, and pt encouraged to lose weight and exercise as able

## 2012-07-23 NOTE — Assessment & Plan Note (Signed)
Reports being out of medication for several weeks, with increased symptoms, will prescribe same dose she has been on in the past

## 2012-07-23 NOTE — Assessment & Plan Note (Signed)
Deteriorated. Patient re-educated about  the importance of commitment to a  minimum of 150 minutes of exercise per week. The importance of healthy food choices with portion control discussed. Encouraged to start a food diary, count calories and to consider  joining a support group. Sample diet sheets offered. Goals set by the patient for the next several months.    

## 2012-07-23 NOTE — Assessment & Plan Note (Signed)
Patient advised to reduce carb and sweets, commit to regular physical activity, take meds as prescribed, test blood as directed, and attempt to lose weight, to improve blood sugar control.   HBA1C was 6.4 in January, she needs updated lab today

## 2012-07-23 NOTE — Progress Notes (Signed)
  Subjective:    Patient ID: [name], female    DOB: [birth year], 78 y.o.   MRN: [id number]  HPI The PT is here for follow up and re-evaluation of chronic medical conditions, medication management and review of any available recent lab and radiology data.  Preventive health is updated, specifically  Cancer screening and Immunization.   The PT states she has been out of her medication for several weeks, she relocated to another town, and appears to be somewhat overwhelmed with this, unaware of where her things are. C/o increased back pain and depression, not suicidal or homicidal, bur anxious , overwhelmed, and somewhat withdrawn , to some extent. C/o head and chest congestion with yellow green nasal drainage and sputum, as well as significant exertional fatigue and dyspnea with activity .Experiences a wheezing in her chest, has not had her inhaler for some time also. Pt is uncertain as to whether she will be transferring her care to the city where she lives and where her daughter also lives. I advise that this would be the most sensible option , though I will miss her     Review of Systems See HPI Denies recent fever has had  chills.  Denies chest pains, palpitations and leg swelling Denies abdominal pain, nausea, vomiting,diarrhea or constipation.   Denies dysuria, frequency, hesitancy or incontinence.  Denies headaches or  Seizures, does experience some lower extremity  Numbness and  tingling.  Denies skin break down or rash.        Objective:   Physical Exam  Patient alert and oriented and in no cardiopulmonary distress.  HEENT: No facial asymmetry, EOMI, maxillary  sinus tenderness,  oropharynx pink and moist.  Neck decreased though adequate ROM, no adenopathy.No JVD  Chest: decreased air entry, bilateral crackles, few wheezes  CVS: S1, S2 no murmurs, no S3.  ABD: Soft non tender. Bowel sounds normal.  Ext: No edema  MS: decreased  ROM spine, shoulders, hips and  knees.  Skin: Intact, no ulcerations or rash noted.  Psych: Good eye contact, normal affect. Memory mildly impaired,  anxious and mildly  depressed appearing.  CNS: CN 2-12 intact, power,  normal throughout.       Assessment & Plan:

## 2012-07-23 NOTE — Assessment & Plan Note (Signed)
Hyperlipidemia:Low fat diet discussed and encouraged.  Dietary management only at this time 

## 2012-07-25 ENCOUNTER — Ambulatory Visit (HOSPITAL_COMMUNITY)
Admission: RE | Admit: 2012-07-25 | Discharge: 2012-07-25 | Disposition: A | Discharge status: Home / Self Care | Payer: Medicare Other | Source: Ambulatory Visit | Attending: Family Medicine | Admitting: Family Medicine

## 2012-07-25 DIAGNOSIS — R05 Cough: Secondary | ICD-10-CM | POA: Insufficient documentation

## 2012-07-25 DIAGNOSIS — J984 Other disorders of lung: Secondary | ICD-10-CM | POA: Diagnosis not present

## 2012-07-25 DIAGNOSIS — R0602 Shortness of breath: Secondary | ICD-10-CM

## 2012-07-25 DIAGNOSIS — J479 Bronchiectasis, uncomplicated: Secondary | ICD-10-CM | POA: Insufficient documentation

## 2012-07-25 DIAGNOSIS — K7689 Other specified diseases of liver: Secondary | ICD-10-CM | POA: Insufficient documentation

## 2012-07-25 DIAGNOSIS — R059 Cough, unspecified: Secondary | ICD-10-CM | POA: Diagnosis not present

## 2012-07-25 DIAGNOSIS — R0989 Other specified symptoms and signs involving the circulatory and respiratory systems: Secondary | ICD-10-CM | POA: Insufficient documentation

## 2012-07-25 DIAGNOSIS — I2510 Atherosclerotic heart disease of native coronary artery without angina pectoris: Secondary | ICD-10-CM | POA: Insufficient documentation

## 2012-07-25 DIAGNOSIS — R0609 Other forms of dyspnea: Secondary | ICD-10-CM | POA: Diagnosis not present

## 2013-04-05 ENCOUNTER — Emergency Department (HOSPITAL_COMMUNITY): Payer: Medicare Other

## 2013-04-05 ENCOUNTER — Encounter (HOSPITAL_COMMUNITY): Payer: Self-pay | Admitting: Emergency Medicine

## 2013-04-05 ENCOUNTER — Emergency Department (HOSPITAL_COMMUNITY)
Admission: EM | Admit: 2013-04-05 | Discharge: 2013-04-05 | Disposition: A | Discharge status: Home / Self Care | Payer: Medicare Other | Attending: Emergency Medicine | Admitting: Emergency Medicine

## 2013-04-05 DIAGNOSIS — Z87448 Personal history of other diseases of urinary system: Secondary | ICD-10-CM | POA: Insufficient documentation

## 2013-04-05 DIAGNOSIS — E7800 Pure hypercholesterolemia, unspecified: Secondary | ICD-10-CM | POA: Insufficient documentation

## 2013-04-05 DIAGNOSIS — E669 Obesity, unspecified: Secondary | ICD-10-CM | POA: Insufficient documentation

## 2013-04-05 DIAGNOSIS — F329 Major depressive disorder, single episode, unspecified: Secondary | ICD-10-CM | POA: Diagnosis not present

## 2013-04-05 DIAGNOSIS — Z87891 Personal history of nicotine dependence: Secondary | ICD-10-CM | POA: Insufficient documentation

## 2013-04-05 DIAGNOSIS — Z8739 Personal history of other diseases of the musculoskeletal system and connective tissue: Secondary | ICD-10-CM | POA: Insufficient documentation

## 2013-04-05 DIAGNOSIS — Y929 Unspecified place or not applicable: Secondary | ICD-10-CM | POA: Insufficient documentation

## 2013-04-05 DIAGNOSIS — Z79899 Other long term (current) drug therapy: Secondary | ICD-10-CM | POA: Insufficient documentation

## 2013-04-05 DIAGNOSIS — S8263XA Displaced fracture of lateral malleolus of unspecified fibula, initial encounter for closed fracture: Secondary | ICD-10-CM | POA: Insufficient documentation

## 2013-04-05 DIAGNOSIS — Z8679 Personal history of other diseases of the circulatory system: Secondary | ICD-10-CM | POA: Insufficient documentation

## 2013-04-05 DIAGNOSIS — S92309A Fracture of unspecified metatarsal bone(s), unspecified foot, initial encounter for closed fracture: Secondary | ICD-10-CM | POA: Insufficient documentation

## 2013-04-05 DIAGNOSIS — J45909 Unspecified asthma, uncomplicated: Secondary | ICD-10-CM | POA: Diagnosis not present

## 2013-04-05 DIAGNOSIS — S8262XA Displaced fracture of lateral malleolus of left fibula, initial encounter for closed fracture: Secondary | ICD-10-CM

## 2013-04-05 DIAGNOSIS — I10 Essential (primary) hypertension: Secondary | ICD-10-CM | POA: Diagnosis not present

## 2013-04-05 DIAGNOSIS — E785 Hyperlipidemia, unspecified: Secondary | ICD-10-CM | POA: Diagnosis not present

## 2013-04-05 DIAGNOSIS — Y9301 Activity, walking, marching and hiking: Secondary | ICD-10-CM | POA: Insufficient documentation

## 2013-04-05 DIAGNOSIS — F3289 Other specified depressive episodes: Secondary | ICD-10-CM | POA: Insufficient documentation

## 2013-04-05 DIAGNOSIS — S92302A Fracture of unspecified metatarsal bone(s), left foot, initial encounter for closed fracture: Secondary | ICD-10-CM

## 2013-04-05 DIAGNOSIS — R296 Repeated falls: Secondary | ICD-10-CM | POA: Insufficient documentation

## 2013-04-05 MED ORDER — ONDANSETRON HCL 4 MG PO TABS: 4.0000 mg | ORAL_TABLET | Freq: Four times a day (QID) | ORAL | Status: DC

## 2013-04-05 MED ORDER — ONDANSETRON 4 MG PO TBDP
8.0000 mg | ORAL_TABLET | Freq: Once | ORAL | Status: AC
  Administered 2013-04-05: 8 mg via ORAL
  Filled 2013-04-05: qty 2

## 2013-04-05 MED ORDER — OXYCODONE-ACETAMINOPHEN 5-325 MG PO TABS: 1.0000 | ORAL_TABLET | Freq: Four times a day (QID) | ORAL | Status: DC | PRN

## 2013-04-05 MED ORDER — OXYCODONE-ACETAMINOPHEN 5-325 MG PO TABS
2.0000 | ORAL_TABLET | Freq: Once | ORAL | Status: AC
  Administered 2013-04-05: 2 via ORAL
  Filled 2013-04-05: qty 2

## 2013-04-05 NOTE — ED Notes (Signed)
Pt c/o fall today after twisting ankle; pt sts right foot pain and knee pain; small abrasion noted to knee; pt denies LOC; CMS intact

## 2013-04-05 NOTE — Discharge Instructions (Signed)
Follow up with Dr. Carola Frost for further evaluation of your fracture. Take percocet as needed for pain and zofran as needed to prevent nausea. Recommend no walking/bearing weight until otherwise instructed. Return to ED if symptoms worsen and/or if signs of compartment syndrome develop.  Ankle Fracture A fracture is a break in the bone. Most of the time, surgery is not needed for a broken ankle. A cast, splint, ankle brace, or walking boot may be used to heal the break. A broken ankle can heal in 6 to 12 weeks. HOME CARE  Do not walk on your broken ankle until told by your doctor.  Use crutches as told by your doctor.  Keep your ankle raised (elevated) to the level of the chest as much as possible.  Take medicine as told by your doctor.  Do not smoke.  Eat healthy foods.  Get follow-up care as told by your doctor.  If you do not need a cast, splint, brace, or boot:  Apply an ice pack to the ankle as told by your doctor.  If you have an ankle brace or walking boot:  Only remove it as told by your doctor.  Apply an ice pack to the ankle as told by your doctor.  If you have a splint or cast:  Rest your plaster splint or cast only on a pillow until it is fully hardened. It may take 3 days for the plaster to harden.  Do not scratch under the splint or cast.  Do not stick anything inside the splint or cast to scratch your skin.  Keep sand and dirt out of the inside of the splint or cast.  Do not pull out the padding from the splint or cast.  Keep your splint or cast dry and clean. Cover it with a plastic bag during showers.  Check the skin around the splint or cast every day. You may put lotion on sore areas.  Do not put pressure on any part of your cast or splint. It may break. GET HELP RIGHT AWAY IF:   You have severe pain, burning or stinging.  Your toes turn blue or gray.  Your toes feel cold or numb.  You cannot move your toes.  Your splint or cast is too  tight.  Your splint or cast breaks.  There is a bad smell or yellowish white fluid (pus) coming from under the splint or cast. MAKE SURE YOU:   Understand these instructions.  Will watch your condition.  Will get help right away if you are not doing well or get worse. Document Released: 10/10/2009 Document Revised: 03/06/2012 Document Reviewed: 10/15/2009 [facility] Patient Information 2013 [place], [place]. Compartment Syndrome of the Foot The foot has numerous (five or more) compartments. The compartments are spaces that are surrounded by tough fibrous tissues. When an injury to the foot occurs, such as with a crushing type of injury, there is no room for the tissues inside those compartments to swell.  Because of this, the compartments must often be opened (fasciotomy) so that the tissues inside the compartment may swell and not be crushed further within the compartment.  DIAGNOSIS  This condition may be diagnosed by the caregiver using examination. Sometimes pressures are taken within the compartment, and the diagnosis is confirmed by measuring an elevated pressure. TREATMENT  Treatment involves fasciotomy. The compartments are opened so that the tissues inside may swell following injury. If further repair within these compartments is required, it may be done at this time. The  cuts (incisions) by the surgeon are usually left open for several days and then repaired. Sometimes skin taken from another place (skin graft) is necessary to close the incisions. COMPLICATIONS There are problems that may develop later with this type of foot injury and treatment. Damage to the small muscles within the foot may lead to:  The toe(s) buckling upward (claw toes).  The arch becoming much higher, and the foot from toes to heel becomes shorter (cavus foot). These complications can be treated at a later date with the appropriate treatment. Usually rebuilding (reconstructive) procedures are necessary. All  of the possibilities of this type of injury will be discussed between you and your caregiver, and the best decision can be reached for treatment of your injury. Your caregiver can let you know your best options if there are options present. Document Released: 03/21/2001 Document Revised: 03/06/2012 Document Reviewed: 07/31/2008 [facility] Patient Information 2013 [place], [place].

## 2013-04-05 NOTE — ED Provider Notes (Signed)
History    This chart was scribed for non-physician practitioner working with Suzi Roots, MD by Donne Anon, ED Scribe. This patient was seen in room TR09C/TR09C and the patient's care was started at 1801.   CSN: [id number]  Arrival date & time 04/05/13  1645   First MD Initiated Contact with Patient 04/05/13 1801      Chief Complaint  Patient presents with  . Fall     The history is provided by the patient. No language interpreter was used.   [name] is a 79 y.o. female who presents to the Emergency Department complaining of sudden onset, constant, non radiating L knee, ankle and foot pain with associated L foot and ankle swelling due to a fall which occurred this evening. She states she was walking, twisted her ankle, and then she fell on her left side on concrete and grass. She states she was not ambulatory after the fall secondary to LLE pain, but denies LOC. Patient admits to associated mild L groin discomfort that is aching in nature and worse when she moves her left leg. She states that the LLE pain is worse with touch and movement. Denies alleviating factors. Patient denies vision changes, back pain, and numbness or tingling in her extremities.   Past Medical History  Diagnosis Date  . High blood pressure   . High cholesterol   . Headache   . Urinary incontinence   . Obesity   . Hyperlipidemia   . Hypertension   . Sciatica   . Back pain   . Family history of arthritis   . Family history of ischemic heart disease   . Family history of diabetes mellitus   . Personal history of unspecified circulatory disease   . Shortness of breath   . Asthma   . Depression   . Pain from implanted hardware 01/28/2012    Past Surgical History  Procedure Laterality Date  . Right knee surgery after fracture  2006  . Abdominal hysterectomy    . Back surgery  2009    Dr. Juliann Pares Nuerosurgeon  . Spine surgery    . Colonoscopy    . Hardware removal  01/28/2012     Procedure: HARDWARE REMOVAL;  Surgeon: Eulas Post, MD;  Location: [facility];  Service: Orthopedics;  Laterality: Right;  Right Knee    Family History  Problem Relation Age of Onset  . Heart defect      family history   . Breast cancer Mother     family history   . Hypertension Mother   . Diabetes Mother   . Hypertension Father   . Stroke Father   . Hypertension Sister   . Hypertension Brother   . Hypertension Brother   . Hypertension Brother   . Diabetes Sister   . Stroke Brother     History  Substance Use Topics  . Smoking status: Former Smoker    Types: Cigarettes  . Smokeless tobacco: Former Neurosurgeon    Quit date: 01/24/1991  . Alcohol Use: No     Review of Systems  Musculoskeletal: Positive for joint swelling and arthralgias.  All other systems reviewed and are negative.    Allergies  Oxycodone; Penicillins; and Tramadol  Home Medications   Current Outpatient Rx  Name  Route  Sig  Dispense  Refill  . albuterol (PROVENTIL HFA;VENTOLIN HFA) 108 (90 BASE) MCG/ACT inhaler   Inhalation   Inhale 1-2 puffs into the lungs every 6 (six)  hours as needed for wheezing.         Marland Kitchen amLODipine (NORVASC) 2.5 MG tablet   Oral   Take 2.5 mg by mouth daily.         . diphenhydrAMINE (BENADRYL) 25 mg capsule   Oral   Take 25 mg by mouth every 6 (six) hours as needed for itching or allergies.         Marland Kitchen sertraline (ZOLOFT) 25 MG tablet   Oral   Take 25 mg by mouth daily.         . ondansetron (ZOFRAN) 4 MG tablet   Oral   Take 1 tablet (4 mg total) by mouth every 6 (six) hours.   12 tablet   0   . oxyCODONE-acetaminophen (PERCOCET/ROXICET) 5-325 MG per tablet   Oral   Take 1-2 tablets by mouth every 6 (six) hours as needed for pain.   10 tablet   0     BP 141/83  Pulse 104  Temp(Src) 99 F (37.2 C) (Oral)  Resp 18  SpO2 98%  Physical Exam  Nursing note and vitals reviewed. Constitutional: She is oriented to person, place, and  time. She appears well-developed and well-nourished. No distress.  HENT:  Head: Normocephalic and atraumatic.  Eyes: Conjunctivae and EOM are normal. No scleral icterus.  Neck: Neck supple. No tracheal deviation present.  Cardiovascular: Intact distal pulses.   DP and PT pulses 2+ b/l. Capillary refill <2 seconds  Pulmonary/Chest: Effort normal. No respiratory distress.  Abdominal: Soft. There is no tenderness. There is no rebound and no guarding.  Musculoskeletal:       Left hip: Normal.       Left knee: Normal.       Left ankle: She exhibits decreased range of motion and swelling. She exhibits no ecchymosis, no deformity and normal pulse. Tenderness. Lateral malleolus tenderness found.       Left foot: She exhibits decreased range of motion, tenderness, bony tenderness and swelling. She exhibits normal capillary refill, no crepitus and no deformity.  Swelling to the left lateral malleolus and lateral dorsal surface of L foot. TTP of lateral malleolus. Limited ROM of ankle and foot secondary to pain and discomfort.   Neurological: She is alert and oriented to person, place, and time.  Skin: Skin is warm and dry. No erythema.  Psychiatric: She has a normal mood and affect. Her behavior is normal.    ED Course  Procedures (including critical care time) DIAGNOSTIC STUDIES: Oxygen Saturation is 98% on room air, normal by my interpretation.    COORDINATION OF CARE: 6:15 PM Discussed treatment plan which includes left foot and ankle with pt at bedside and pt agreed to plan.   7:13 PM Recheck. Notified pt of fracture. Advised pt to follow up with Dr. Carola Frost. Discussed symptoms that would require a return to the ED. Pt requested a wheelchair instead of crutches. Will prescribe Percocet and Zofran.    Dg Ankle Complete Left  04/05/2013  *RADIOLOGY REPORT*  Clinical Data: Fall  LEFT ANKLE COMPLETE - 3+ VIEW  Comparison: None.  Findings: Minimally displaced fracture involving the inferior aspect  of the lateral malleolus.  Ankle mortise is otherwise intact.  Overlying mild soft tissue swelling.  IMPRESSION: Minimally displaced fracture involving the inferior aspect of the lateral malleolus.   Original Report Authenticated By: Charline Bills, [name]    Dg Foot Complete Left  04/05/2013  *RADIOLOGY REPORT*  Clinical Data: Fall, pain/swelling along the 3rd-5th metastarsals  LEFT FOOT - COMPLETE 3+ VIEW  Comparison: None.  Findings: Minimally displaced fracture involving the base of the fifth metatarsal.  No additional fracture is seen.  Mild degenerative changes of the first MTP joint.  Overlying mild soft tissue swelling.  IMPRESSION: Minimally displaced fracture involving the base of the fifth metatarsal.   Original Report Authenticated By: Charline Bills, [name]     Labs Reviewed - No data to display No results found.   1. Fractured lateral malleolus, left, closed, initial encounter   2. Fracture of fifth metatarsal bone, left, closed, initial encounter       MDM  Closed minimally displaced fracture of L inferior lateral malleolus and closed minimally displaced fracture of base of 5th L metatarsal. Patient neurovascularly intact without sensory deficits. Short leg splint applied in ED and patient's pain controlled with PO percocet. Patient advised to refrain from weight bearing; expresses discomfort with using crutches at home. Has a walker, but would prefer a wheelchair. Care management consulted for coordination of care to have wheelchair brought to patient's house. Patient otherwise stable with orthopedic follow up and percocet for pain management. Indications for ED return discussed. Patient states comfort and understanding with this d/c plan with no unaddressed concerns.  I personally performed the services described in this documentation, which was scribed in my presence. The recorded information has been reviewed and is accurate.          Antony Madura, PA-C 04/08/13 2041

## 2013-04-05 NOTE — ED Notes (Signed)
Patient transported to X-ray 

## 2013-04-05 NOTE — Progress Notes (Signed)
Orthopedic Tech Progress Note Patient Details:  [name] [birth year] [id number]  Ortho Devices Type of Ortho Device: Ace wrap;Short leg splint Ortho Device/Splint Location: (L) LE Ortho Device/Splint Interventions: Application;Ordered   Jennye Moccasin 04/05/2013, 7:31 PM

## 2013-04-10 NOTE — ED Provider Notes (Signed)
Medical screening examination/treatment/procedure(s) were performed by non-physician practitioner and as supervising physician I was immediately available for consultation/collaboration.   Suzi Roots, MD 04/10/13 [phone]

## 2013-04-12 DIAGNOSIS — S8263XA Displaced fracture of lateral malleolus of unspecified fibula, initial encounter for closed fracture: Secondary | ICD-10-CM | POA: Diagnosis not present

## 2013-04-12 DIAGNOSIS — S92309A Fracture of unspecified metatarsal bone(s), unspecified foot, initial encounter for closed fracture: Secondary | ICD-10-CM | POA: Diagnosis not present

## 2013-04-30 DIAGNOSIS — S8290XD Unspecified fracture of unspecified lower leg, subsequent encounter for closed fracture with routine healing: Secondary | ICD-10-CM | POA: Diagnosis not present

## 2013-05-30 DIAGNOSIS — S8290XD Unspecified fracture of unspecified lower leg, subsequent encounter for closed fracture with routine healing: Secondary | ICD-10-CM | POA: Diagnosis not present

## 2013-07-02 DIAGNOSIS — IMO0001 Reserved for inherently not codable concepts without codable children: Secondary | ICD-10-CM | POA: Diagnosis not present

## 2013-08-07 ENCOUNTER — Ambulatory Visit (INDEPENDENT_AMBULATORY_CARE_PROVIDER_SITE_OTHER): Payer: Medicare Other | Admitting: Family Medicine

## 2013-08-07 ENCOUNTER — Other Ambulatory Visit: Payer: Self-pay

## 2013-08-07 ENCOUNTER — Encounter: Payer: Self-pay | Admitting: Family Medicine

## 2013-08-07 VITALS — BP 140/88 | HR 99 | Resp 16 | Ht 64.0 in | Wt 239.1 lb

## 2013-08-07 DIAGNOSIS — I10 Essential (primary) hypertension: Secondary | ICD-10-CM

## 2013-08-07 DIAGNOSIS — R5381 Other malaise: Secondary | ICD-10-CM

## 2013-08-07 DIAGNOSIS — R5383 Other fatigue: Secondary | ICD-10-CM

## 2013-08-07 DIAGNOSIS — F329 Major depressive disorder, single episode, unspecified: Secondary | ICD-10-CM

## 2013-08-07 DIAGNOSIS — R7301 Impaired fasting glucose: Secondary | ICD-10-CM

## 2013-08-07 DIAGNOSIS — J4520 Mild intermittent asthma, uncomplicated: Secondary | ICD-10-CM

## 2013-08-07 DIAGNOSIS — J45909 Unspecified asthma, uncomplicated: Secondary | ICD-10-CM

## 2013-08-07 DIAGNOSIS — G4700 Insomnia, unspecified: Secondary | ICD-10-CM

## 2013-08-07 DIAGNOSIS — Z0100 Encounter for examination of eyes and vision without abnormal findings: Secondary | ICD-10-CM | POA: Diagnosis not present

## 2013-08-07 DIAGNOSIS — E785 Hyperlipidemia, unspecified: Secondary | ICD-10-CM

## 2013-08-07 DIAGNOSIS — Z0000 Encounter for general adult medical examination without abnormal findings: Secondary | ICD-10-CM

## 2013-08-07 DIAGNOSIS — F3289 Other specified depressive episodes: Secondary | ICD-10-CM

## 2013-08-07 LAB — TSH: TSH: 1.801 u[IU]/mL (ref 0.350–4.500)

## 2013-08-07 LAB — COMPREHENSIVE METABOLIC PANEL
BUN: 14 mg/dL (ref 6–23)
CO2: 27 mEq/L (ref 19–32)
Calcium: 10.7 mg/dL — ABNORMAL HIGH (ref 8.4–10.5)
Chloride: 102 mEq/L (ref 96–112)
Creat: 0.75 mg/dL (ref 0.50–1.10)
Glucose, Bld: 91 mg/dL (ref 70–99)

## 2013-08-07 LAB — HEMOGLOBIN A1C
Hgb A1c MFr Bld: 6.1 % — ABNORMAL HIGH (ref <5.7)
Mean Plasma Glucose: 128 mg/dL — ABNORMAL HIGH (ref <117)

## 2013-08-07 LAB — LIPID PANEL
HDL: 66 mg/dL (ref >39)
Triglycerides: 211 mg/dL — ABNORMAL HIGH (ref <150)

## 2013-08-07 LAB — CBC WITH DIFFERENTIAL/PLATELET
HCT: 42.5 % (ref 36.0–46.0)
Hemoglobin: 13.8 g/dL (ref 12.0–15.0)
Lymphocytes Relative: 41 % (ref 12–46)
Monocytes Absolute: 1.1 10*3/uL — ABNORMAL HIGH (ref 0.1–1.0)
Monocytes Relative: 10 % (ref 3–12)
Neutro Abs: 5.3 10*3/uL (ref 1.7–7.7)
RBC: 5.24 MIL/uL — ABNORMAL HIGH (ref 3.87–5.11)
WBC: 11.1 10*3/uL — ABNORMAL HIGH (ref 4.0–10.5)

## 2013-08-07 MED ORDER — ALBUTEROL SULFATE HFA 108 (90 BASE) MCG/ACT IN AERS: 2.0000 | INHALATION_SPRAY | Freq: Four times a day (QID) | RESPIRATORY_TRACT | Status: DC | PRN

## 2013-08-07 MED ORDER — BUDESONIDE-FORMOTEROL FUMARATE 80-4.5 MCG/ACT IN AERO: 2.0000 | INHALATION_SPRAY | Freq: Two times a day (BID) | RESPIRATORY_TRACT | Status: DC

## 2013-08-07 MED ORDER — AMLODIPINE BESYLATE 2.5 MG PO TABS: 2.5000 mg | ORAL_TABLET | Freq: Every day | ORAL | Status: DC

## 2013-08-07 MED ORDER — ASPIRIN EC 81 MG PO TBEC: 81.0000 mg | DELAYED_RELEASE_TABLET | Freq: Every day | ORAL | Status: AC

## 2013-08-07 MED ORDER — CALCIUM CARBONATE-VITAMIN D 500-200 MG-UNIT PO TABS: 1.0000 | ORAL_TABLET | Freq: Two times a day (BID) | ORAL | Status: AC

## 2013-08-07 MED ORDER — TEMAZEPAM 15 MG PO CAPS: 15.0000 mg | ORAL_CAPSULE | Freq: Every evening | ORAL | Status: DC | PRN

## 2013-08-07 MED ORDER — SERTRALINE HCL 25 MG PO TABS: 25.0000 mg | ORAL_TABLET | Freq: Every day | ORAL | Status: DC

## 2013-08-07 NOTE — Patient Instructions (Addendum)
F/u in 3 month, with rectal, call if you need me before  Labs today, cbc, cmp, lipid, HBA1C and TSH  You are referred to Dr Nile Riggs  New medication, symbicort every day for breathing, proventil at bedtime if you need it for wheezing  Aspirin every day to reduce stroke risk   Resume zoloft and new for sleep is restoril, (less potential for memory loss than xanax)  Norvasc sent for blood pressure.  New is calcium with D for bones  You MAY need medication for blood sugar and cholesterol , we will let you know

## 2013-08-07 NOTE — Progress Notes (Signed)
Subjective:    Patient ID: [name], female    DOB: 04-Mar-1934, 79 y.o.   MRN: [id number]  HPI Preventive Screening-Counseling & Management   Patient present here today for a Medicare annual wellness visit. Pt has not been seen in the office for almost 1 year after moving to [facility], states she wants to return due to poor quality of life, but at the same time states she wants to stay in [place] to educate her grand daughter there Chronic problems are reviewed, and medication re instituted as necessary   Current Problems (verified)   Medications Prior to Visit Allergies (verified)   PAST HISTORY  Family History: seven sibs living, 2 passed. Mom alive age 48, had breast cancer, diabetes no dementia or depression  Social History Widow for 5 years after 22 year marriage. Mom of 1 biologic daughter, and 2 stepchildren. 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 yes  Over the past two weeks, have you felt little interest or pleasure in doing things? yes  Have you lost interest or pleasure in daily life? yes  Do you often feel hopeless? yes  Do you cry easily over simple problems? yes   Activities of Daily Living  In your present state of health, do you have any difficulty performing the following activities?  Driving?: never drove Managing money?: No Feeding yourself?:No Getting from bed to chair?:yes some difficulty due to arthritis Climbing a flight of stairs?:yes due to arthritis Preparing food and  eating?:No Bathing or showering?:No Getting dressed?:No Getting to the toilet?:No Using the toilet?:No Moving around from place to place?: yes due to arthritis  Fall Risk Assessment In the past year have you fallen or had a near fall?:yes Are you currently taking any medications that make you dizzy?:No   Hearing Difficulties: No Do you often ask people to speak up or repeat themselves?:no Do you experience ringing or noises in your ears?:No Do you have difficulty understanding soft or whispered voices?:no  Cognitive Testing  Alert? Yes Normal Appearance?Yes  Oriented to person? Yes Place? Yes  Time? Yes  Displays appropriate judgment?Yes  Can read the correct time from a watch face? yes Are you having problems remembering things?No  Advanced Directives have been discussed with the patient?Yes , full code   List the Names of Other Physician/Practitioners you currently use: Dr Waymond Cera any recent Medical Services you may have received from other than [facility] providers in the past year (date may be approximate).   Assessment:    Annual Wellness Exam   Plan:    During the course of the visit the patient was educated and counseled about appropriate screening and preventive services including:  A healthy diet is rich in fruit, vegetables and whole grains. Poultry fish, nuts and beans are a healthy choice for protein rather then red meat. A low sodium diet and drinking 64 ounces of water daily is generally recommended. Oils and sweet should be limited. Carbohydrates especially for those who are diabetic or overweight, should be  limited to 30-45 gram per meal. It is important to eat on a regular schedule, at least 3 times daily. Snacks should be primarily fruits, vegetables or nuts. It is important that you exercise regularly at least 30 minutes 5 times a week. If you develop chest pain, have severe difficulty breathing, or feel very tired, stop exercising immediately and seek  medical attention  Immunization reviewed and updated. Cancer screening reviewed and updated    Patient Instructions (the written plan) was given to the patient.  Medicare Attestation  I have personally reviewed:  The patient's medical and social history  Their use of alcohol, tobacco or illicit drugs  Their current medications and supplements  The patient's functional ability including ADLs,fall risks, home safety risks, cognitive, and hearing and visual impairment  Diet and physical activities  Evidence for depression or mood disorders  The patient's weight, height, BMI, and visual acuity have been recorded in the chart. I have made referrals, counseling, and provided education to the patient based on review of the above and I have provided the patient with a written personalized care plan for preventive services.      Review of Systems     Objective:   Physical Exam  Patient alert and oriented and in no cardiopulmonary distress.  HEENT: No facial asymmetry, EOMI, no sinus tenderness,  oropharynx pink and moist.  Neck decreased though adeqaute ROM no adenopathy.  Chest: Decreased air entry throughout though adequate few high  pitched wheezes, no crackles  CVS: S1, S2 no murmurs, no S3.  ABD: Soft non tender.  Ext: No edema  WU:[id number] ROM spine, shoulders, hips and knees.  Skin: Intact, no ulcerations or rash noted.  Psych: Good eye contact, Memory intact  anxious at times, tearful and  depressed appearing.  CNS: CN 2-12 intact, power, tone and sensation normal throughout.       Assessment & Plan:

## 2013-08-14 MED ORDER — PRAVASTATIN SODIUM 20 MG PO TABS: 20.0000 mg | ORAL_TABLET | Freq: Every evening | ORAL | Status: DC

## 2013-08-19 ENCOUNTER — Telehealth: Payer: Self-pay | Admitting: Family Medicine

## 2013-08-19 DIAGNOSIS — Z0000 Encounter for general adult medical examination without abnormal findings: Secondary | ICD-10-CM | POA: Insufficient documentation

## 2013-08-19 NOTE — Assessment & Plan Note (Addendum)
Deteriorated, resume medication. Pt not suicidal or homicidal, she has no hallucinations The grand daughter who she is raising is a very positive part of her life.Depression has worsened with passing of her spouse, the need to leave her home due to disrepair and her current living condition shet states is roach infested, sttaes landlord is aware and will be coming to address the problem

## 2013-08-19 NOTE — Assessment & Plan Note (Signed)
Improved , with lower HBa1C. Pt applauded on this and encouraged to continue same

## 2013-08-19 NOTE — Assessment & Plan Note (Signed)
Pt to resume me medication to improve control, low dose only

## 2013-08-19 NOTE — Assessment & Plan Note (Signed)
Annual  Wellness as documented. Pt is severely depressed with far from healthy living conditions, states environ is roach infested but this will be addressed soon. She is a high fall risk with h/o multiple falls , and has impaired vision as well as severe generalized arthritis, She is referred for eye exam

## 2013-08-19 NOTE — Assessment & Plan Note (Signed)
Hyperlipidemia:Low fat diet discussed and encouraged.  Pt to start pravachol

## 2013-08-19 NOTE — Assessment & Plan Note (Signed)
Sleep hygiene reviewed, pt to resume temazepam

## 2013-08-19 NOTE — Telephone Encounter (Signed)
Please let pt know that on further review of blood count I do not think she needs to see a hematologist now. I will repeat lab when she returns for her follow up in 3.5 months, and decide at that time. Tell her sorry about mixed message, but I do believe this is the most appropriate approach now, labs are not significantly abnormal

## 2013-08-19 NOTE — Assessment & Plan Note (Signed)
Resume daily symbicort and have rescue proventil available

## 2013-08-21 NOTE — Telephone Encounter (Signed)
Patient aware.

## 2013-08-23 ENCOUNTER — Telehealth: Payer: Self-pay | Admitting: Family Medicine

## 2013-08-23 NOTE — Telephone Encounter (Signed)
Ins states pt did not fill advair , may be due to cost. Pls send in symbicort in its place if needed, if you confirm with pt she has no inhaler and cost was the problem dose is the 80/4.5 1 puff twice daily Thanks

## 2013-10-04 DIAGNOSIS — Z961 Presence of intraocular lens: Secondary | ICD-10-CM | POA: Diagnosis not present

## 2013-10-05 ENCOUNTER — Other Ambulatory Visit: Payer: Self-pay

## 2013-10-05 DIAGNOSIS — J4520 Mild intermittent asthma, uncomplicated: Secondary | ICD-10-CM

## 2013-10-05 MED ORDER — BUDESONIDE-FORMOTEROL FUMARATE 80-4.5 MCG/ACT IN AERO: 2.0000 | INHALATION_SPRAY | Freq: Two times a day (BID) | RESPIRATORY_TRACT | Status: DC

## 2013-10-05 NOTE — Telephone Encounter (Signed)
Patient will collect Symbicort.  Savings card faxed to pharmacy.  Patient will call back if cost is a problem.

## 2013-11-07 ENCOUNTER — Ambulatory Visit: Payer: Medicare Other | Admitting: Family Medicine

## 2013-11-30 ENCOUNTER — Telehealth: Payer: Self-pay

## 2013-11-30 ENCOUNTER — Other Ambulatory Visit: Payer: Self-pay

## 2013-11-30 DIAGNOSIS — E785 Hyperlipidemia, unspecified: Secondary | ICD-10-CM

## 2013-11-30 MED ORDER — AMLODIPINE BESYLATE 2.5 MG PO TABS: 2.5000 mg | ORAL_TABLET | Freq: Every day | ORAL | Status: DC

## 2013-11-30 MED ORDER — PRAVASTATIN SODIUM 20 MG PO TABS: 20.0000 mg | ORAL_TABLET | Freq: Every evening | ORAL | Status: DC

## 2013-11-30 NOTE — Telephone Encounter (Signed)
She has had back surgery in the past, if pain is still a problem, 2 weeks after a fall, she should have this evaluated either at nearest urgent care or offer an appt her for next Monday. As a w ork in.  I suggest OTC ibuprofen 200mg  up to two  twice daily as needed, for 3 to 5 days, only as far as pain management, this is OTC. She is allergic to tramamdol and I would not be prescribing anything stronnger without eval

## 2013-12-05 NOTE — Telephone Encounter (Signed)
Patient aware.

## 2014-01-01 ENCOUNTER — Ambulatory Visit: Payer: Medicare Other | Admitting: Family Medicine

## 2014-03-07 ENCOUNTER — Telehealth: Payer: Self-pay | Admitting: Family Medicine

## 2014-03-07 DIAGNOSIS — E785 Hyperlipidemia, unspecified: Secondary | ICD-10-CM

## 2014-03-07 MED ORDER — PRAVASTATIN SODIUM 20 MG PO TABS: 20.0000 mg | ORAL_TABLET | Freq: Every evening | ORAL | Status: DC

## 2014-03-07 MED ORDER — AMLODIPINE BESYLATE 2.5 MG PO TABS
2.5000 mg | ORAL_TABLET | Freq: Every day | ORAL | Status: DC
Start: 2014-03-07 — End: 2014-05-13

## 2014-03-07 NOTE — Telephone Encounter (Signed)
1 MONTH SENT ONLY WITH MESSAGE THAT THIS WAS LAST REFILL UNTIL OV

## 2014-04-08 ENCOUNTER — Telehealth: Payer: Self-pay

## 2014-04-09 NOTE — Telephone Encounter (Signed)
Patient states that she has been having problems with left ear x 2 weeks after having sinus drainage.  Is considering going to urgent care while provider is unavailable.  Given appt for 5/18 for routine followup as patient has not been seen recently.

## 2014-04-11 DIAGNOSIS — J45909 Unspecified asthma, uncomplicated: Secondary | ICD-10-CM | POA: Diagnosis not present

## 2014-04-11 DIAGNOSIS — J309 Allergic rhinitis, unspecified: Secondary | ICD-10-CM | POA: Diagnosis not present

## 2014-04-11 DIAGNOSIS — H6590 Unspecified nonsuppurative otitis media, unspecified ear: Secondary | ICD-10-CM | POA: Diagnosis not present

## 2014-05-13 ENCOUNTER — Ambulatory Visit (INDEPENDENT_AMBULATORY_CARE_PROVIDER_SITE_OTHER): Payer: Medicare Other | Admitting: Family Medicine

## 2014-05-13 ENCOUNTER — Encounter (INDEPENDENT_AMBULATORY_CARE_PROVIDER_SITE_OTHER): Payer: Self-pay

## 2014-05-13 ENCOUNTER — Telehealth: Payer: Self-pay

## 2014-05-13 ENCOUNTER — Encounter: Payer: Self-pay | Admitting: Family Medicine

## 2014-05-13 VITALS — BP 130/80 | HR 100 | Resp 16 | Wt 234.0 lb

## 2014-05-13 DIAGNOSIS — Z659 Problem related to unspecified psychosocial circumstances: Secondary | ICD-10-CM

## 2014-05-13 DIAGNOSIS — F329 Major depressive disorder, single episode, unspecified: Secondary | ICD-10-CM

## 2014-05-13 DIAGNOSIS — Z658 Other specified problems related to psychosocial circumstances: Secondary | ICD-10-CM

## 2014-05-13 DIAGNOSIS — R296 Repeated falls: Secondary | ICD-10-CM

## 2014-05-13 DIAGNOSIS — E559 Vitamin D deficiency, unspecified: Secondary | ICD-10-CM | POA: Insufficient documentation

## 2014-05-13 DIAGNOSIS — E669 Obesity, unspecified: Secondary | ICD-10-CM

## 2014-05-13 DIAGNOSIS — G4700 Insomnia, unspecified: Secondary | ICD-10-CM

## 2014-05-13 DIAGNOSIS — E785 Hyperlipidemia, unspecified: Secondary | ICD-10-CM

## 2014-05-13 DIAGNOSIS — R7301 Impaired fasting glucose: Secondary | ICD-10-CM | POA: Diagnosis not present

## 2014-05-13 DIAGNOSIS — J45909 Unspecified asthma, uncomplicated: Secondary | ICD-10-CM

## 2014-05-13 DIAGNOSIS — Z9181 History of falling: Secondary | ICD-10-CM

## 2014-05-13 DIAGNOSIS — R7302 Impaired glucose tolerance (oral): Secondary | ICD-10-CM

## 2014-05-13 DIAGNOSIS — R7309 Other abnormal glucose: Secondary | ICD-10-CM

## 2014-05-13 DIAGNOSIS — I10 Essential (primary) hypertension: Secondary | ICD-10-CM | POA: Diagnosis not present

## 2014-05-13 DIAGNOSIS — F3289 Other specified depressive episodes: Secondary | ICD-10-CM

## 2014-05-13 LAB — COMPLETE METABOLIC PANEL WITH GFR
ALT: 26 U/L (ref 0–35)
AST: 18 U/L (ref 0–37)
Albumin: 4.7 g/dL (ref 3.5–5.2)
Alkaline Phosphatase: 84 U/L (ref 39–117)
BILIRUBIN TOTAL: 0.6 mg/dL (ref 0.2–1.2)
BUN: 16 mg/dL (ref 6–23)
CALCIUM: 10.2 mg/dL (ref 8.4–10.5)
CHLORIDE: 101 meq/L (ref 96–112)
CO2: 28 meq/L (ref 19–32)
CREATININE: 0.72 mg/dL (ref 0.50–1.10)
GFR, Est Non African American: 81 mL/min
GLUCOSE: 95 mg/dL (ref 70–99)
Potassium: 4.6 mEq/L (ref 3.5–5.3)
Sodium: 141 mEq/L (ref 135–145)
Total Protein: 8.4 g/dL — ABNORMAL HIGH (ref 6.0–8.3)

## 2014-05-13 LAB — CBC
HCT: 44.3 % (ref 36.0–46.0)
Hemoglobin: 14.4 g/dL (ref 12.0–15.0)
MCH: 26.4 pg (ref 26.0–34.0)
MCHC: 32.5 g/dL (ref 30.0–36.0)
MCV: 81.3 fL (ref 78.0–100.0)
Platelets: 401 10*3/uL — ABNORMAL HIGH (ref 150–400)
RBC: 5.45 MIL/uL — ABNORMAL HIGH (ref 3.87–5.11)
RDW: 16.1 % — AB (ref 11.5–15.5)
WBC: 11.9 10*3/uL — AB (ref 4.0–10.5)

## 2014-05-13 LAB — LIPID PANEL
Cholesterol: 209 mg/dL — ABNORMAL HIGH (ref 0–200)
HDL: 80 mg/dL (ref >39)
LDL CALC: 100 mg/dL — AB (ref 0–99)
TRIGLYCERIDES: 143 mg/dL (ref <150)
Total CHOL/HDL Ratio: 2.6 Ratio
VLDL: 29 mg/dL (ref 0–40)

## 2014-05-13 LAB — HEMOGLOBIN A1C
Hgb A1c MFr Bld: 6.6 % — ABNORMAL HIGH (ref <5.7)
Mean Plasma Glucose: 143 mg/dL — ABNORMAL HIGH (ref <117)

## 2014-05-13 MED ORDER — SERTRALINE HCL 25 MG PO TABS: 25.0000 mg | ORAL_TABLET | Freq: Every day | ORAL | Status: DC

## 2014-05-13 MED ORDER — PRAVASTATIN SODIUM 20 MG PO TABS: 20.0000 mg | ORAL_TABLET | Freq: Every evening | ORAL | Status: DC

## 2014-05-13 MED ORDER — AMLODIPINE BESYLATE 2.5 MG PO TABS: 2.5000 mg | ORAL_TABLET | Freq: Every day | ORAL | Status: DC

## 2014-05-13 MED ORDER — AMLODIPINE BESYLATE 5 MG PO TABS: 5.0000 mg | ORAL_TABLET | Freq: Every day | ORAL | Status: DC

## 2014-05-13 NOTE — Patient Instructions (Addendum)
Annual wellness in 3 month,call if you need me before  You will be referred to [facility] for evaluation of your living situation, you report living in a roach infested home, and being physically far frommfamily with no access to transporatation.  HBA1c, cmp7 and EGFR, Vit D, lipid, cbc today  No changes in medication doses, blood pressure is good .Pleasse take cholesterol pill every day so you remember it  Resume depression daily

## 2014-05-13 NOTE — Assessment & Plan Note (Signed)
Untreated, non compliant with med prescribed, no access to psychotherapy, to get necessary health care and for more interaction with family and friends. [facility] referral

## 2014-05-13 NOTE — Assessment & Plan Note (Signed)
Controlled, no change in medication  

## 2014-05-13 NOTE — Progress Notes (Signed)
Subjective:    Patient ID: [name], female    DOB: 05-27-1934, 80 y.o.   MRN: [id number]  HPI The PT is here for follow up and re-evaluation of chronic medical conditions, medication management and review of any available recent lab and radiology data.  Preventive health is updated, specifically  Cancer screening and Immunization.  Needs mammogram and dexa, no reliable transpt Was treated i urgent care for sinus infection approx 1 month ago, symptoms have resolved Reports another fall since last visit in 07/2013. The PT denies any adverse reactions to current medications since the last visit. Not taking her antidepressant, states it "ran out' and states she is very depressed due to social isolation, and unhealthy living environ, which she describes as roach infested       Review of Systems See HPI Denies recent fever or chills.c/o excessive fatigue states sh just eats, sits and gets fatter.  Denies sinus pressure, nasal congestion, ear pain or sore throat. Denies chest congestion, productive cough or wheezing. Denies chest pains, palpitations and leg swelling Denies abdominal pain, nausea, vomiting,diarrhea or constipation.   Denies dysuria, frequency, hesitancy or incontinence.  Denies headaches, seizures, Denies skin break down or rash.         Objective:   Physical Exam  BP 130/80  Pulse 100  Resp 16  Wt 234 lb (106.142 kg)  SpO2 95% Patient alert and oriented and in no cardiopulmonary distress at rest  HEENT: No facial asymmetry, EOMI, no sinus tenderness,  oropharynx pink and moist.  Neck adeqaute ROM, no jVD, no adenopathy.  Chest: Clear to auscultation bilaterally.  CVS: S1, S2 no murmurs, no S3.  ABD: Soft , obese, non tender. Bowel sounds normal.  Ext: No edema  MS: decreased  ROM spine, shoulders, hips and knees.  Skin: Intact, no ulcerations or rash noted.  Psych: Good eye contact, normal affect. Memory intact  anxious at times tearful and   depressed appearing.  CNS: CN 2-12 intact, power,  normal throughout.       Assessment & Plan:  HTN (hypertension), benign Controlled, no change in medication   Vitamin d deficiency Updated lab needed . Pt requests med refill feels this will help her bone pain   Multiple falls Pt has had another fall this year. Reports left leg weakness, needs PT , but limited access to transport  HYPERLIPIDEMIA Often "forgets " statin. I have advised she take along with bP med in the daytime. [facility] referral placed due probs with meds and very poor social situation  OBESITY Unchanged Patient re-educated about  the importance of commitment to a  minimum of 150 minutes of exercise per week. The importance of healthy food choices with portion control discussed. Encouraged to start a food diary, count calories and to consider  joining a support group. Sample diet sheets offered. Goals set by the patient for the next several months.     IGT (impaired glucose tolerance) Updated lab today. Patient educated about the importance of limiting  Carbohydrate intake , the need to commit to daily physical activity for a minimum of 30 minutes , and to commit weight loss. The fact that changes in all these areas will reduce or eliminate all together the development of diabetes is stressed.     DEPRESSION Untreated, non compliant with med prescribed, no access to psychotherapy, to get necessary health care and for more interaction with family and friends. [facility] referral  Insomnia Sleep hygiene reviewed, will treat depression initially  Poor social situation Lives with 2 great grand children in roach infested home reportedly. No access to transport  To get to medical appts, or to visit family or friends, go to [place] or seniors grp. Very depressed and feels socially isolated, her spouse's death has caused deterioration in her physical and mental condition [facility] referral to assess needs and ways to improve  living conditins . Pt now states she is willoing to return to [employer]

## 2014-05-13 NOTE — Assessment & Plan Note (Signed)
Updated lab today Patient educated about the importance of limiting  Carbohydrate intake , the need to commit to daily physical activity for a minimum of 30 minutes , and to commit weight loss. The fact that changes in all these areas will reduce or eliminate all together the development of diabetes is stressed.   .  

## 2014-05-13 NOTE — Assessment & Plan Note (Signed)
Often "forgets " statin. I have advised she take along with bP med in the daytime. [facility] referral placed due probs with meds and very poor social situation

## 2014-05-13 NOTE — Telephone Encounter (Signed)
Agree with advice re cramps.  Will eval "lumps next visit, or if she wishes to be referred to dermatology in [facility] for sooner eval then pls refer, i will sign

## 2014-05-13 NOTE — Assessment & Plan Note (Signed)
Stable no recent flares 

## 2014-05-13 NOTE — Assessment & Plan Note (Signed)
Updated lab needed . Pt requests med refill feels this will help her bone pain

## 2014-05-13 NOTE — Assessment & Plan Note (Signed)
Pt has had another fall this year. Reports left leg weakness, needs PT , but limited access to transport

## 2014-05-13 NOTE — Assessment & Plan Note (Signed)
Lives with 2 great grand children in roach infested home reportedly. No access to transport  To get to medical appts, or to visit family or friends, go to [place] or seniors grp. Very depressed and feels socially isolated, her spouse's death has caused deterioration in her physical and mental condition [facility] referral to assess needs and ways to improve living conditins . Pt now states she is willoing to return to [employer]

## 2014-05-13 NOTE — Assessment & Plan Note (Signed)
Unchanged. Patient re-educated about  the importance of commitment to a  minimum of 150 minutes of exercise per week. The importance of healthy food choices with portion control discussed. Encouraged to start a food diary, count calories and to consider  joining a support group. Sample diet sheets offered. Goals set by the patient for the next several months.    

## 2014-05-13 NOTE — Assessment & Plan Note (Signed)
Sleep hygiene reviewed, will treat depression initially

## 2014-05-14 LAB — VITAMIN D 25 HYDROXY (VIT D DEFICIENCY, FRACTURES): Vit D, 25-Hydroxy: 28 ng/mL — ABNORMAL LOW (ref 30–89)

## 2014-05-14 NOTE — Telephone Encounter (Signed)
Patient aware of referral to [facility] and agrees.  She would like to hold on referral to dermatology at this time until she comes back for her next follow up

## 2014-06-13 ENCOUNTER — Encounter: Payer: Self-pay | Admitting: Family Medicine

## 2014-06-13 ENCOUNTER — Ambulatory Visit (INDEPENDENT_AMBULATORY_CARE_PROVIDER_SITE_OTHER): Payer: Medicare Other | Admitting: Family Medicine

## 2014-06-13 ENCOUNTER — Encounter (INDEPENDENT_AMBULATORY_CARE_PROVIDER_SITE_OTHER): Payer: Self-pay

## 2014-06-13 VITALS — BP 132/82 | HR 100 | Resp 20 | Ht 64.0 in | Wt 231.0 lb

## 2014-06-13 DIAGNOSIS — J320 Chronic maxillary sinusitis: Secondary | ICD-10-CM

## 2014-06-13 DIAGNOSIS — J42 Unspecified chronic bronchitis: Secondary | ICD-10-CM

## 2014-06-13 DIAGNOSIS — R110 Nausea: Secondary | ICD-10-CM

## 2014-06-13 DIAGNOSIS — M4800 Spinal stenosis, site unspecified: Secondary | ICD-10-CM | POA: Diagnosis not present

## 2014-06-13 DIAGNOSIS — Z9181 History of falling: Secondary | ICD-10-CM

## 2014-06-13 DIAGNOSIS — R109 Unspecified abdominal pain: Secondary | ICD-10-CM

## 2014-06-13 DIAGNOSIS — R296 Repeated falls: Secondary | ICD-10-CM

## 2014-06-13 MED ORDER — METHYLPREDNISOLONE ACETATE 80 MG/ML IJ SUSP
80.0000 mg | Freq: Once | INTRAMUSCULAR | Status: AC
  Administered 2014-06-13: 80 mg via INTRAMUSCULAR

## 2014-06-13 MED ORDER — KETOROLAC TROMETHAMINE 60 MG/2ML IM SOLN
60.0000 mg | Freq: Once | INTRAMUSCULAR | Status: AC
  Administered 2014-06-13: 60 mg via INTRAMUSCULAR

## 2014-06-13 MED ORDER — BENZONATATE 100 MG PO CAPS: 100.0000 mg | ORAL_CAPSULE | Freq: Two times a day (BID) | ORAL | Status: DC | PRN

## 2014-06-13 MED ORDER — DOXYCYCLINE HYCLATE 100 MG PO TABS: 100.0000 mg | ORAL_TABLET | Freq: Two times a day (BID) | ORAL | Status: DC

## 2014-06-13 MED ORDER — ONDANSETRON HCL 4 MG/2ML IJ SOLN
4.0000 mg | Freq: Once | INTRAMUSCULAR | Status: AC
  Administered 2014-06-13: 4 mg via INTRAMUSCULAR

## 2014-06-13 MED ORDER — ONDANSETRON HCL 4 MG PO TABS: ORAL_TABLET | ORAL | Status: DC

## 2014-06-13 NOTE — Assessment & Plan Note (Signed)
Uncontrolled neck and back pain , toradol 60mg  and depo medrol 60 mg IM ion office today

## 2014-06-13 NOTE — Patient Instructions (Addendum)
Annual wellness as before in September, call if you need me sooner  You are treated for left maxillary sinusitis, and also for bronchitis  CXR at [place] please , order has been sent in  I am also referring you for an [place] of your abdomen due to nausea  Toradol and depo medrol in office for back pain , Antibiotic, decongestant and nausea medication has been sent in

## 2014-06-13 NOTE — Progress Notes (Signed)
   Subjective:    Patient ID: [name], female    DOB: Oct 23, 1934, 80 y.o.   MRN: [id number]  HPI 2 week h/o chills , fatigue, left facial pressure cough productive of yellow sputum, also has a lot nausea, and at times experiences pain and bloating depending on food eaten C/o increased and uncontrolled back pain in the past 2 weeks , requests injections for this. Denies frequency or dysuria   Review of Systems See HPI Denies chest pains, palpitations and leg swelling Denies abdominal pain, nausea, vomiting,diarrhea or constipation.   Denies headaches, seizures, numbness, or tingling. Denies depression, anxiety or insomnia. Denies skin break down or rash.        Objective:   Physical Exam BP 132/82  Pulse 100  Resp 20  Ht 5\' 4"  (1.626 m)  Wt 231 lb 0.6 oz (104.799 kg)  BMI 39.64 kg/m2  SpO2 93%  Patient alert and oriented and in no cardiopulmonary distress.Ill appearing and in pain  HEENT: No facial asymmetry, EOMI,   oropharynx pink and moist.  Neck supple no JVD, no mass. Left maxillary sinus tender, TM clear , edema of nasal mucosa Chest: Adequate air entry, bilateral crackles , no wheezes CVS: S1, S2 no murmurs, no S3.Regular rate.  ABD: Soft non tender.   Ext: No edema  MS: Decreased ROM lumbar  Spine,adequate in  shoulders, hips and knees.  Skin: Intact, no ulcerations or rash noted.  Psych: Good eye contact, normal affect. Memory intact not anxious or depressed appearing.  CNS: CN 2-12 intact, power,  normal throughout.no focal deficits noted.        Assessment & Plan:  SPINAL STENOSIS Uncontrolled neck and back pain , toradol 60mg  and depo medrol 60 mg IM ion office today  Left maxillary sinusitis Acute sinusitis, antibiotic prescribed  Chronic bronchitis Decongestant, anmd antibiotic and CXR  Nausea without vomiting Chronic nausea, dyspepsia and bloating, ultrasound ordered, will refer to GI if negative  Multiple falls Increased fall  risk with fall history, home safety reviewed. Would benefit from PT, transport a problem, social work is working with the pt

## 2014-06-17 ENCOUNTER — Other Ambulatory Visit: Payer: Self-pay

## 2014-06-19 ENCOUNTER — Ambulatory Visit (HOSPITAL_COMMUNITY)
Admission: RE | Admit: 2014-06-19 | Discharge: 2014-06-19 | Disposition: A | Discharge status: Home / Self Care | Payer: Medicare Other | Source: Ambulatory Visit | Attending: Family Medicine | Admitting: Family Medicine

## 2014-06-19 DIAGNOSIS — R059 Cough, unspecified: Secondary | ICD-10-CM | POA: Diagnosis not present

## 2014-06-19 DIAGNOSIS — R05 Cough: Secondary | ICD-10-CM | POA: Diagnosis not present

## 2014-06-19 DIAGNOSIS — R109 Unspecified abdominal pain: Secondary | ICD-10-CM | POA: Diagnosis not present

## 2014-06-19 DIAGNOSIS — K8020 Calculus of gallbladder without cholecystitis without obstruction: Secondary | ICD-10-CM | POA: Insufficient documentation

## 2014-06-19 DIAGNOSIS — R110 Nausea: Secondary | ICD-10-CM

## 2014-06-19 DIAGNOSIS — J42 Unspecified chronic bronchitis: Secondary | ICD-10-CM

## 2014-06-20 ENCOUNTER — Telehealth: Payer: Self-pay

## 2014-06-20 ENCOUNTER — Other Ambulatory Visit: Payer: Self-pay | Admitting: Family Medicine

## 2014-06-20 DIAGNOSIS — K8020 Calculus of gallbladder without cholecystitis without obstruction: Secondary | ICD-10-CM

## 2014-06-20 DIAGNOSIS — R935 Abnormal findings on diagnostic imaging of other abdominal regions, including retroperitoneum: Secondary | ICD-10-CM

## 2014-06-20 NOTE — Telephone Encounter (Signed)
Message copied by Bernita Raisin on Thu Jun 20, 2014  4:28 PM ------      Message from: Tula Nakayama E      Created: Wed Jun 19, 2014  4:05 PM       Pls advise her that she has  gallstones , I will signe she has gallstones, since she experiences symptoms of pain and nausea , I recommend surgical consult for removal of her gallbladder, would recommend the surgical grp in [place] since she lives there.      If she wishes to proceed pls enter referral I will sign            ??? pls ask ------

## 2014-06-20 NOTE — Progress Notes (Signed)
Patient aware.  Referred to surgeon in [place]

## 2014-07-03 ENCOUNTER — Telehealth: Payer: Self-pay | Admitting: Family Medicine

## 2014-07-03 NOTE — Telephone Encounter (Signed)
pls notify pharmacy and pt to change from symbicort to advair which is covered , i have entered pls send in after you spjk with them

## 2014-07-04 ENCOUNTER — Other Ambulatory Visit: Payer: Self-pay

## 2014-07-04 MED ORDER — FLUTICASONE-SALMETEROL 100-50 MCG/DOSE IN AEPB
1.0000 | INHALATION_SPRAY | Freq: Two times a day (BID) | RESPIRATORY_TRACT | Status: DC
Start: 2014-07-04 — End: 2014-09-10

## 2014-07-04 NOTE — Telephone Encounter (Signed)
Pt aware.

## 2014-07-11 ENCOUNTER — Ambulatory Visit (INDEPENDENT_AMBULATORY_CARE_PROVIDER_SITE_OTHER): Payer: Medicare Other | Admitting: General Surgery

## 2014-07-11 ENCOUNTER — Encounter (INDEPENDENT_AMBULATORY_CARE_PROVIDER_SITE_OTHER): Payer: Self-pay | Admitting: General Surgery

## 2014-07-11 VITALS — BP 124/80 | HR 83 | Temp 98.3°F | Ht 64.0 in | Wt 228.0 lb

## 2014-07-11 DIAGNOSIS — R1032 Left lower quadrant pain: Secondary | ICD-10-CM | POA: Diagnosis not present

## 2014-07-11 NOTE — Patient Instructions (Signed)
I do not believe the pain you have been having is related to your gallbladder I recommend that your primary care doctor refer you to a gastroenterologist to further evaluate your left lower quadrant pain  GETTING TO [place]. Irregular bowel habits such as constipation and diarrhea can lead to many problems over time.  Having one soft bowel movement a day is the most important way to prevent further problems.  The anorectal canal is designed to handle stretching and feces to safely manage our ability to get rid of solid waste (feces, poop, stool) out of our body.  BUT, hard constipated stools can act like ripping concrete bricks and diarrhea can be a burning fire to this very sensitive area of our body, causing inflamed hemorrhoids, anal fissures, increasing risk is perirectal abscesses, abdominal pain/bloating, an making irritable bowel worse.     The goal: ONE SOFT BOWEL MOVEMENT A DAY!  To have soft, regular bowel movements:    Drink at least 8 tall glasses of water a day.     Take plenty of fiber.  Fiber is the undigested part of plant food that passes into the colon, acting s "natures broom" to encourage bowel motility and movement.  Fiber can absorb and hold large amounts of water. This results in a larger, bulkier stool, which is soft and easier to pass. Work gradually over several weeks up to 6 servings a day of fiber (25g a day even more if needed) in the form of: [add the fiber slowly to your diet to avoid bloating/cramping] o Vegetables -- Root (potatoes, carrots, turnips), leafy green (lettuce, salad greens, celery, spinach), or cooked high residue (cabbage, broccoli, etc) o Fruit -- Fresh (unpeeled skin & pulp), Dried (prunes, apricots, cherries, etc ),  or stewed ( applesauce)  o Whole grain breads, pasta, etc (whole wheat)  o Bran cereals    Bulking Agents -- This type of water-retaining fiber generally is easily obtained each day by one of the following:  o Psyllium bran -- The  psyllium plant is remarkable because its ground seeds can retain so much water. This product is available as Metamucil, Konsyl, Effersyllium, Per Diem Fiber, or the less expensive generic preparation in drug and health food stores. Although labeled a laxative, it really is not a laxative.  o Methylcellulose -- This is another fiber derived from wood which also retains water. It is available as Citrucel. o Polyethylene Glycol - and "artificial" fiber commonly called Miralax or Glycolax.  It is helpful for people with gassy or bloated feelings with regular fiber o Flax Seed - a less gassy fiber than psyllium   No reading or other relaxing activity while on the toilet. If bowel movements take longer than 5 minutes, you are too constipated   AVOID CONSTIPATION.  High fiber and water intake usually takes care of this.  Sometimes a laxative is needed to stimulate more frequent bowel movements, but    Laxatives are not a good long-term solution as it can wear the colon out. o Osmotics (Milk of Magnesia, Fleets phosphosoda, Magnesium citrate, MiraLax, GoLytely) are safer than  o Stimulants (Senokot, Castor Oil, Dulcolax, Ex Lax)    o Do not take laxatives for more than 7days in a row.    IF SEVERELY CONSTIPATED, try a Bowel Retraining Program: o Do not use laxatives.  o Eat a diet high in roughage, such as bran cereals and leafy vegetables.  o Drink six (6) ounces of prune or apricot juice  each morning.  o Eat two (2) large servings of stewed fruit each day.  o Take one (1) heaping tablespoon of a psyllium-based bulking agent twice a day. Use sugar-free sweetener when possible to avoid excessive calories.  o Eat a normal breakfast.  o Set aside 15 minutes after breakfast to sit on the toilet, but do not strain to have a bowel movement.  o If you do not have a bowel movement by the third day, use an enema and repeat the above steps.  o

## 2014-07-11 NOTE — Progress Notes (Signed)
Patient ID: [name], female   DOB: January 03, 1934, 80 y.o.   MRN: [id number]  Chief Complaint  Patient presents with  . eval gallbladder    HPI [name] is a 80 y.o. female.   HPI 80 yo morbidly obese AAF referred by Dr Moshe Cipro for evaluation of gallbladder disease. The patient complains of sharp pain in her lower abdomen just to the left upper midlung followed by nausea. She states it has been present for several months. It occurs only daily basis. It doesn't last long. When she does get the pain she rates as an 8/10. She has daily bowel movements. The pain does radiate in her upper abdomen. She denies any melena or hematochezia. She has had a prior colonoscopy many years ago. She denies any bloating. She denies any epigastric and right upper quadrant pain. The discomfort can occur at any particular time. It does not appear to be related to food intake.  Past Medical History  Diagnosis Date  . High blood pressure   . High cholesterol   . Headache(784.0)   . Urinary incontinence   . Obesity   . Hyperlipidemia   . Hypertension   . Sciatica   . Back pain   . Family history of arthritis   . Family history of ischemic heart disease   . Family history of diabetes mellitus   . Personal history of unspecified circulatory disease   . Shortness of breath   . Asthma   . Depression   . Pain from implanted hardware 01/28/2012    Past Surgical History  Procedure Laterality Date  . Right knee surgery after fracture  2006  . Abdominal hysterectomy    . Back surgery  2009    Dr. Boyce Medici Nuerosurgeon  . Spine surgery    . Colonoscopy    . Hardware removal  01/28/2012    Procedure: HARDWARE REMOVAL;  Surgeon: Johnny Bridge, MD;  Location: [facility];  Service: Orthopedics;  Laterality: Right;  Right Knee    Family History  Problem Relation Age of Onset  . Heart defect      family history   . Breast cancer Mother     family history   . Hypertension Mother    . Diabetes Mother   . Hypertension Father   . Stroke Father   . Hypertension Sister   . Hypertension Brother   . Hypertension Brother   . Hypertension Brother   . Diabetes Sister   . Stroke Brother     Social History History  Substance Use Topics  . Smoking status: Former Smoker    Types: Cigarettes  . Smokeless tobacco: Former Systems developer    Quit date: 01/24/1991  . Alcohol Use: No    Allergies  Allergen Reactions  . Oxycodone   . Penicillins   . Tramadol     Current Outpatient Prescriptions  Medication Sig Dispense Refill  . albuterol (PROVENTIL HFA;VENTOLIN HFA) 108 (90 BASE) MCG/ACT inhaler Inhale 2 puffs into the lungs every 6 (six) hours as needed for wheezing.  1 Inhaler  2  . amLODipine (NORVASC) 2.5 MG tablet Take 1 tablet (2.5 mg total) by mouth daily.  30 tablet  5  . aspirin EC 81 MG tablet Take 1 tablet (81 mg total) by mouth daily.  150 tablet  2  . benzonatate (TESSALON) 100 MG capsule Take 1 capsule (100 mg total) by mouth 2 (two) times daily as needed for cough.  20 capsule  0  . calcium-vitamin D (OSCAL 500/200 D-3) 500-200 MG-UNIT per tablet Take 1 tablet by mouth 2 (two) times daily.  180 tablet  3  . Fluticasone-Salmeterol (ADVAIR) 100-50 MCG/DOSE AEPB Inhale 1 puff into the lungs 2 (two) times daily.  60 each  11  . pravastatin (PRAVACHOL) 20 MG tablet Take 1 tablet (20 mg total) by mouth every evening.  30 tablet  3  . sertraline (ZOLOFT) 25 MG tablet Take 1 tablet (25 mg total) by mouth daily.  30 tablet  5   No current facility-administered medications for this visit.    Review of Systems Review of Systems  Constitutional: Negative for fever, activity change, appetite change and unexpected weight change.  HENT: Negative for nosebleeds and trouble swallowing.   Eyes: Negative for photophobia and visual disturbance.  Respiratory: Negative for chest tightness and shortness of breath.   Cardiovascular: Negative for chest pain and leg swelling.        Denies CP, orthopnea, PND, + DOE, +SOB  Genitourinary: Negative for dysuria and difficulty urinating.  Musculoskeletal: Negative for arthralgias.  Skin: Negative for pallor and rash.  Neurological: Negative for dizziness, seizures, facial asymmetry and numbness.       Denies TIA and amaurosis fugax   Hematological: Negative for adenopathy. Does not bruise/bleed easily.  Psychiatric/Behavioral: Negative for behavioral problems and agitation.    Blood pressure 124/80, pulse 83, temperature 98.3 F (36.8 C), height 5\' 4"  (1.626 m), weight 228 lb (103.42 kg).  Physical Exam Physical Exam  Vitals reviewed. Constitutional: She is oriented to person, place, and time. She appears well-developed and well-nourished. No distress.  Morbidly obese, using a cane  HENT:  Head: Normocephalic and atraumatic.  Right Ear: External ear normal.  Left Ear: External ear normal.  Eyes: Conjunctivae are normal. No scleral icterus.  Neck: Normal range of motion. Neck supple. No tracheal deviation present. No thyromegaly present.  Cardiovascular: Normal rate and normal heart sounds.   Pulmonary/Chest: Effort normal and breath sounds normal. No stridor. No respiratory distress. She has no wheezes.  Abdominal: Soft. She exhibits no distension. There is no rebound and no guarding.    Some very very mild LLQ TTP  Musculoskeletal: She exhibits no edema and no tenderness.  Lymphadenopathy:    She has no cervical adenopathy.  Neurological: She is alert and oriented to person, place, and time. She exhibits normal muscle tone.  Skin: Skin is warm and dry. No rash noted. She is not diaphoretic. No erythema.  Psychiatric: She has a normal mood and affect. Her behavior is normal. Judgment and thought content normal.    Data Reviewed Abdominal ultrasound PCP office note  Assessment    Left lower quadrant pain Gallstones     Plan    Although her ultrasound did demonstrate gallstones her symptoms are not  consistent with gallbladder disease. The etiology of her intermittent left lower quadrant pain is unclear. There is no evidence of an incisional hernia. I do not have records to her colonoscopy. It is possible she could be having some mild diverticular discomfort and disease. Recommend that her PCP consider referral of the patient back to her gastroenterologist. As I explained to the patient I doubt cholecystectomy would ameliorate her abdominal discomfort.   Leighton Ruff. Redmond Pulling, MD, FACS General, Bariatric, & Minimally Invasive Surgery [facility], [place]        [facility] M 07/11/2014, 1:44 PM

## 2014-07-12 ENCOUNTER — Telehealth: Payer: Self-pay | Admitting: Family Medicine

## 2014-07-12 DIAGNOSIS — R109 Unspecified abdominal pain: Secondary | ICD-10-CM

## 2014-07-12 NOTE — Telephone Encounter (Signed)
Pls let pt know that i have received the report from the surgeon who saw her re gallstones. He recommends she see a GI Doc for eval of lower abdominal; pain, her last colonoscopy was in 2006, pls refer her to GI of her choice , since in [place], may just want [facility] eval lower abdominal pain, if she agrees pls let me knwo, I will enter the referral with necesssary details

## 2014-07-16 NOTE — Telephone Encounter (Signed)
Patient does agree with referral

## 2014-07-17 NOTE — Telephone Encounter (Signed)
pls refer pt to [facility] eval pt with lower abdominal paion, last colonoscopy 9 years ago, had been referred to surgery re cholelithiasis, however , recommendation is for GI eval , referral entered

## 2014-07-17 NOTE — Addendum Note (Signed)
Addended by: Tula Nakayama E on: 07/17/2014 12:20 PM   Modules accepted: Orders

## 2014-07-25 ENCOUNTER — Encounter: Payer: Self-pay | Admitting: Internal Medicine

## 2014-08-19 ENCOUNTER — Telehealth: Payer: Self-pay | Admitting: Family Medicine

## 2014-08-19 DIAGNOSIS — R2681 Unsteadiness on feet: Secondary | ICD-10-CM

## 2014-08-19 NOTE — Telephone Encounter (Signed)
Called patient and left message for them to return call at the office   

## 2014-08-20 NOTE — Telephone Encounter (Signed)
Spoke with [facility].  Nurse is stating that patient is requesting home health for therapy due to frequent falls.  Patient is also not taking Pravastatin.  Will followup with patient as to why she is not taking this med.

## 2014-08-21 NOTE — Telephone Encounter (Signed)
pls refer for pT for frequent falls and unsteady gait, I will sign

## 2014-08-22 NOTE — Addendum Note (Signed)
Addended by: Eual Fines on: 08/22/2014 03:06 PM   Modules accepted: Orders

## 2014-09-05 ENCOUNTER — Encounter: Payer: Medicare Other | Admitting: Family Medicine

## 2014-09-09 ENCOUNTER — Telehealth: Payer: Self-pay | Admitting: Family Medicine

## 2014-09-09 NOTE — Telephone Encounter (Signed)
Pls contact pt , recent communication from [facility] states she recently moved and "lost' her meds" in the move , pls send in refills on all her medications so she  At least has access to them from a prescription standpoint if this is indeed the case. She  Missed appt last week due to transport issues  FYI , [facility] is signing off on her also as goals have "been met"

## 2014-09-10 ENCOUNTER — Other Ambulatory Visit: Payer: Self-pay

## 2014-09-10 DIAGNOSIS — F3289 Other specified depressive episodes: Secondary | ICD-10-CM

## 2014-09-10 DIAGNOSIS — E785 Hyperlipidemia, unspecified: Secondary | ICD-10-CM

## 2014-09-10 DIAGNOSIS — I10 Essential (primary) hypertension: Secondary | ICD-10-CM

## 2014-09-10 DIAGNOSIS — J4520 Mild intermittent asthma, uncomplicated: Secondary | ICD-10-CM

## 2014-09-10 DIAGNOSIS — F329 Major depressive disorder, single episode, unspecified: Secondary | ICD-10-CM

## 2014-09-10 MED ORDER — FLUTICASONE-SALMETEROL 100-50 MCG/DOSE IN AEPB: 1.0000 | INHALATION_SPRAY | Freq: Two times a day (BID) | RESPIRATORY_TRACT | Status: DC

## 2014-09-10 MED ORDER — AMLODIPINE BESYLATE 2.5 MG PO TABS
2.5000 mg | ORAL_TABLET | Freq: Every day | ORAL | Status: DC
Start: 2014-09-10 — End: 2015-03-19

## 2014-09-10 MED ORDER — ALBUTEROL SULFATE HFA 108 (90 BASE) MCG/ACT IN AERS
2.0000 | INHALATION_SPRAY | Freq: Four times a day (QID) | RESPIRATORY_TRACT | Status: DC | PRN
Start: 2014-09-10 — End: 2015-08-01

## 2014-09-10 MED ORDER — SERTRALINE HCL 25 MG PO TABS: 25.0000 mg | ORAL_TABLET | Freq: Every day | ORAL | Status: DC

## 2014-09-10 MED ORDER — PRAVASTATIN SODIUM 20 MG PO TABS: 20.0000 mg | ORAL_TABLET | Freq: Every evening | ORAL | Status: DC

## 2014-09-10 NOTE — Telephone Encounter (Signed)
Refills sent tried to call pt but no answer

## 2014-09-11 ENCOUNTER — Telehealth: Payer: Self-pay | Admitting: Family Medicine

## 2014-09-11 ENCOUNTER — Telehealth (HOSPITAL_COMMUNITY): Payer: Self-pay

## 2014-09-11 DIAGNOSIS — R296 Repeated falls: Secondary | ICD-10-CM

## 2014-09-11 DIAGNOSIS — R2681 Unsteadiness on feet: Secondary | ICD-10-CM

## 2014-09-11 NOTE — Telephone Encounter (Signed)
Ok to refer? Duration?

## 2014-09-11 NOTE — Telephone Encounter (Signed)
pls refer for unsteady gait and h/o falls to PT at [facility] twice  Weekly x 6 weeks

## 2014-09-11 NOTE — Telephone Encounter (Signed)
Pt referred.

## 2014-09-11 NOTE — Telephone Encounter (Signed)
Louann at Dr. Griffin Dakin office called to cx - patient wants to go to [facility]

## 2014-09-11 NOTE — Addendum Note (Signed)
Addended by: Eual Fines on: 09/11/2014 02:26 PM   Modules accepted: Orders

## 2014-09-17 ENCOUNTER — Ambulatory Visit (HOSPITAL_COMMUNITY): Payer: Medicare Other | Admitting: Physical Therapy

## 2014-09-23 ENCOUNTER — Ambulatory Visit: Payer: Medicare Other | Admitting: Physical Therapy

## 2014-09-24 ENCOUNTER — Ambulatory Visit: Payer: Medicare Other | Admitting: Family Medicine

## 2014-09-27 ENCOUNTER — Ambulatory Visit: Payer: Medicare Other | Attending: Family Medicine

## 2014-09-27 DIAGNOSIS — Z9181 History of falling: Secondary | ICD-10-CM | POA: Diagnosis not present

## 2014-09-27 DIAGNOSIS — Z5189 Encounter for other specified aftercare: Secondary | ICD-10-CM | POA: Insufficient documentation

## 2014-09-27 DIAGNOSIS — R279 Unspecified lack of coordination: Secondary | ICD-10-CM | POA: Diagnosis not present

## 2014-09-30 ENCOUNTER — Ambulatory Visit (INDEPENDENT_AMBULATORY_CARE_PROVIDER_SITE_OTHER): Payer: Medicare Other | Admitting: Internal Medicine

## 2014-09-30 ENCOUNTER — Encounter: Payer: Self-pay | Admitting: Internal Medicine

## 2014-09-30 VITALS — BP 148/82 | HR 88 | Ht 64.0 in | Wt 234.2 lb

## 2014-09-30 DIAGNOSIS — Z1211 Encounter for screening for malignant neoplasm of colon: Secondary | ICD-10-CM

## 2014-09-30 DIAGNOSIS — R1084 Generalized abdominal pain: Secondary | ICD-10-CM

## 2014-09-30 DIAGNOSIS — R159 Full incontinence of feces: Secondary | ICD-10-CM

## 2014-09-30 DIAGNOSIS — R1030 Lower abdominal pain, unspecified: Secondary | ICD-10-CM | POA: Diagnosis not present

## 2014-09-30 DIAGNOSIS — R195 Other fecal abnormalities: Secondary | ICD-10-CM

## 2014-09-30 MED ORDER — HYOSCYAMINE SULFATE 0.125 MG SL SUBL: 0.1250 mg | SUBLINGUAL_TABLET | SUBLINGUAL | Status: DC | PRN

## 2014-09-30 MED ORDER — PEG-KCL-NACL-NASULF-NA ASC-C 100 G PO SOLR: 1.0000 | Freq: Once | ORAL | Status: DC

## 2014-09-30 NOTE — Progress Notes (Signed)
Patient ID: [name], female   DOB: [birth year], 80 y.o.   MRN: [id number] HPI: [name] is a 80 yo female with PMH of hypertension, hyperlipidemia, obesity, asthma and was seen in consultation at the request of [name] to evaluate lower abdominal pain and consider repeating colonoscopy. She is here alone today. She reports that for the past several years she has developed lower abdominal pain associated with loose stools. She reports she is always had tendencies for having loose stools but usually not with abdominal pain. She is having 3-4 bowel movements a day without nocturnal stools She reports "I've never been constipated". Recently she has noticed fecal incontinence and this can occur even when urinating. This has led to accidents and soiling herself. The cramping abdominal pain can become intense it is only present during bowel movement and relieved by defecation. She denies blood in her stool or melena. She does have frequent lower gas and some bloating. She denies upper abdominal complaints including no nausea or vomiting. No hepatobiliary complaints. No dysphagia or odynophagia and no recent issues with heartburn. Her weight has been stable. She denies a family history of colorectal cancer. Reportedly she had a colonoscopy approximately 10 years ago and [place] with Dr. Tamala Julian, she recalls as being unremarkable.  Past Medical History  Diagnosis Date  . High blood pressure   . High cholesterol   . Headache(784.0)   . Urinary incontinence   . Obesity   . Hyperlipidemia   . Hypertension   . Sciatica   . Back pain   . Family history of arthritis   . Family history of ischemic heart disease   . Family history of diabetes mellitus   . Personal history of unspecified circulatory disease   . Shortness of breath   . Asthma   . Depression   . Pain from implanted hardware 01/28/2012    Past Surgical History  Procedure Laterality Date  . Right knee surgery after fracture  2006   . Abdominal hysterectomy    . Back surgery  2009    Dr. Boyce Medici Nuerosurgeon  . Spine surgery    . Colonoscopy    . Hardware removal  01/28/2012    Procedure: HARDWARE REMOVAL;  Surgeon: Johnny Bridge, MD;  Location: [facility];  Service: Orthopedics;  Laterality: Right;  Right Knee    Outpatient Prescriptions Prior to Visit  Medication Sig Dispense Refill  . albuterol (PROVENTIL HFA;VENTOLIN HFA) 108 (90 BASE) MCG/ACT inhaler Inhale 2 puffs into the lungs every 6 (six) hours as needed for wheezing.  1 Inhaler  3  . amLODipine (NORVASC) 2.5 MG tablet Take 1 tablet (2.5 mg total) by mouth daily.  30 tablet  3  . benzonatate (TESSALON) 100 MG capsule Take 1 capsule (100 mg total) by mouth 2 (two) times daily as needed for cough.  20 capsule  0  . Fluticasone-Salmeterol (ADVAIR) 100-50 MCG/DOSE AEPB Inhale 1 puff into the lungs 2 (two) times daily.  60 each  3  . pravastatin (PRAVACHOL) 20 MG tablet Take 1 tablet (20 mg total) by mouth every evening.  30 tablet  3  . sertraline (ZOLOFT) 25 MG tablet Take 1 tablet (25 mg total) by mouth daily.  30 tablet  3   No facility-administered medications prior to visit.    Allergies  Allergen Reactions  . Oxycodone   . Penicillins   . Pravastatin Other (See Comments)    Gives pt headaches  . Tramadol  Family History  Problem Relation Age of Onset  . Heart defect      family history   . Breast cancer Mother     family history   . Hypertension Mother   . Diabetes Mother   . Hypertension Father   . Stroke Father   . Hypertension Sister   . Hypertension Brother   . Hypertension Brother   . Hypertension Brother   . Diabetes Sister   . Stroke Brother   . Colon cancer Neg Hx   . Colon polyps Neg Hx   . Esophageal cancer Neg Hx     History  Substance Use Topics  . Smoking status: Former Smoker    Types: Cigarettes    Quit date: 12/27/1993  . Smokeless tobacco: Former Systems developer    Quit date: 01/24/1991  .  Alcohol Use: No    ROS: As per history of present illness, otherwise negative  BP 148/82  Pulse 88  Ht 5\' 4"  (1.626 m)  Wt 234 lb 4 oz (106.255 kg)  BMI 40.19 kg/m2 Constitutional: Well-developed and well-nourished. No distress. HEENT: Normocephalic and atraumatic. Oropharynx is clear and moist. No oropharyngeal exudate. Conjunctivae are normal.  No scleral icterus. Neck: Neck supple. Trachea midline. Cardiovascular: Normal rate, regular rhythm and intact distal pulses.  Pulmonary/chest: Effort normal and breath sounds normal. No wheezing, rales or rhonchi. Abdominal: Soft, obese nontender, nondistended. Bowel sounds active throughout. Extremities: no clubbing, cyanosis, or edema Neurological: Alert and oriented to person place and time. Psychiatric: Normal mood and affect. Behavior is normal.  RELEVANT LABS AND IMAGING: CBC    Component Value Date/Time   WBC 11.9* 05/13/2014 1212   RBC 5.45* 05/13/2014 1212   HGB 14.4 05/13/2014 1212   HCT 44.3 05/13/2014 1212   PLT 401* 05/13/2014 1212   MCV 81.3 05/13/2014 1212   MCH 26.4 05/13/2014 1212   MCHC 32.5 05/13/2014 1212   RDW 16.1* 05/13/2014 1212   LYMPHSABS 4.6* 08/07/2013 1215   MONOABS 1.1* 08/07/2013 1215   EOSABS 0.1 08/07/2013 1215   BASOSABS 0.0 08/07/2013 1215    CMP     Component Value Date/Time   NA 141 05/13/2014 1212   K 4.6 05/13/2014 1212   CL 101 05/13/2014 1212   CO2 28 05/13/2014 1212   GLUCOSE 95 05/13/2014 1212   BUN 16 05/13/2014 1212   CREATININE 0.72 05/13/2014 1212   CREATININE 0.62 12/04/2011 1340   CALCIUM 10.2 05/13/2014 1212   PROT 8.4* 05/13/2014 1212   ALBUMIN 4.7 05/13/2014 1212   AST 18 05/13/2014 1212   ALT 26 05/13/2014 1212   ALKPHOS 84 05/13/2014 1212   BILITOT 0.6 05/13/2014 1212   GFRNONAA 81 05/13/2014 1212   GFRNONAA 86* 12/04/2011 1340   GFRAA >89 05/13/2014 1212   GFRAA >90 12/04/2011 1340    ASSESSMENT/PLAN:  80 yo female with PMH of hypertension, hyperlipidemia, obesity, asthma and was seen  in consultation at the request of [name] to evaluate lower abdominal pain and consider repeating colonoscopy.  1. Loose stools/fecal incontinence/colorectal cancer screening -- I recommended a colonoscopy to evaluate her loose stools and fecal incontinence, but also for colorectal cancer screening. We discussed the test including the risks and benefits and she is agreeable to proceed. I would like her to add Benefiber 1-2 tablespoons daily in an attempt to help bulk her stool and prevent loose stools. I will prescribe Levsin for cramping but also its anticholinergic properties, one tablet every 4 hours as needed. At colonoscopy  we'll perform random biopsies to exclude microscopic colitis. If colonoscopy is negative would consider trial of loperamide.  2. Gallstones -- evaluated earlier by Dr. Redmond Pulling with general surgery and symptoms not felt consistent with gallbladder disease. I agree that she currently doesn't have any complaint consistent with biliary colic.

## 2014-09-30 NOTE — Patient Instructions (Addendum)
You have been scheduled for a colonoscopy. Please follow written instructions given to you at your visit today.  Please pick up your prep kit at the pharmacy within the next 1-3 days. If you use inhalers (even only as needed), please bring them with you on the day of your procedure. Your physician has requested that you go to www.startemmi.com and enter the access code given to you at your visit today. This web site gives a general overview about your procedure. However, you should still follow specific instructions given to you by our office regarding your preparation for the procedure.  We have sent your prescription of Levsin to your pharmacy Use Benefiber TBSPOON daily

## 2014-10-01 ENCOUNTER — Telehealth: Payer: Self-pay | Admitting: Family Medicine

## 2014-10-01 NOTE — Telephone Encounter (Signed)
Noted  

## 2014-10-01 NOTE — Telephone Encounter (Signed)
I called the [facility] at [phone] and spoke with Erline Levine and patient wanted me to cancel all her appointments and I could only cancel one appointment the patient will have to call and speak with the office and patient is aware she has the phone number to call before 10.12.2015 that is when her next appointment is and patient under stands to call back at [phone]

## 2014-10-02 ENCOUNTER — Ambulatory Visit: Payer: Medicare Other

## 2014-10-07 ENCOUNTER — Encounter: Payer: Self-pay | Admitting: Internal Medicine

## 2014-10-07 ENCOUNTER — Ambulatory Visit (AMBULATORY_SURGERY_CENTER): Payer: Medicare Other | Admitting: Internal Medicine

## 2014-10-07 ENCOUNTER — Ambulatory Visit: Payer: Medicare Other

## 2014-10-07 VITALS — BP 137/96 | HR 78 | Temp 98.1°F | Resp 33 | Ht 64.0 in | Wt 234.0 lb

## 2014-10-07 DIAGNOSIS — I10 Essential (primary) hypertension: Secondary | ICD-10-CM | POA: Diagnosis not present

## 2014-10-07 DIAGNOSIS — R159 Full incontinence of feces: Secondary | ICD-10-CM

## 2014-10-07 DIAGNOSIS — J45909 Unspecified asthma, uncomplicated: Secondary | ICD-10-CM | POA: Diagnosis not present

## 2014-10-07 DIAGNOSIS — Z1211 Encounter for screening for malignant neoplasm of colon: Secondary | ICD-10-CM

## 2014-10-07 DIAGNOSIS — D175 Benign lipomatous neoplasm of intra-abdominal organs: Secondary | ICD-10-CM

## 2014-10-07 DIAGNOSIS — R109 Unspecified abdominal pain: Secondary | ICD-10-CM | POA: Diagnosis not present

## 2014-10-07 MED ORDER — SODIUM CHLORIDE 0.9 % IV SOLN
500.0000 mL | INTRAVENOUS | Status: DC
Start: 2014-10-07 — End: 2014-10-07

## 2014-10-07 NOTE — Op Note (Signed)
[place]  [address] [place], [address]   COLONOSCOPY PROCEDURE REPORT  PATIENT: Kendra, [name]  MR#: [id number] BIRTHDATE: 12-Feb-1934 , 80  yrs. old GENDER: female ENDOSCOPIST: Jerene Bears, MD REFERRED [id number] Moshe Cipro, [name] PROCEDURE DATE:  10/07/2014 PROCEDURE:   Colonoscopy with biopsy First Screening Colonoscopy - Avg.  risk and is 50 yrs.  old or older - No.  Prior Negative Screening - Now for repeat screening. 10 or more years since last screening  History of Adenoma - Now for follow-up colonoscopy & has been > or = to 3 yrs.  N/A  Polyps Removed Today? No.  Polyps Removed Today? No.  Recommend repeat exam, <10 yrs? Polyps Removed Today? No.  Recommend repeat exam, <10 yrs? No. ASA CLASS:   Class III INDICATIONS:average risk for colorectal cancer, chronic diarrhea, and fecal incontinence. MEDICATIONS: Monitored anesthesia care and Propofol 160 mg IV  DESCRIPTION OF PROCEDURE:   After the risks benefits and alternatives of the procedure were thoroughly explained, informed consent was obtained.  The digital rectal exam revealed no rectal mass.   The LB [id number]  endoscope was introduced through the anus and advanced to the cecum, which was identified by both the appendix and ileocecal valve. No adverse events experienced. The quality of the prep was good, using MoviPrep  The instrument was then slowly withdrawn as the colon was fully examined.     COLON FINDINGS: Lipomatous IC valve.  No discrete polyp seen, but cold forceps biopsies obtained to exclude adenomatous change. There was mild diverticulosis noted in the sigmoid colon, descending colon, and ascending colon.   The colonic mucosa appeared normal throughout the entire examined colon.  Multiple random biopsies were performed using cold forceps.  Samples were sent to R/O microscopic colitis.  Retroflexed views revealed no abnormalities. The time to cecum=4 minutes 25 seconds.   Withdrawal time=9 minutes 01 seconds.  The scope was withdrawn and the procedure completed.  COMPLICATIONS: There were no immediate complications.  ENDOSCOPIC IMPRESSION: 1.   Lipomatous IC valve.  No discrete polyp seen, but cold forceps biopsies obtained to exclude adenomatous change 2.   Mild diverticulosis was noted in the sigmoid colon, descending colon, and ascending colon 3.   The colonic mucosa appeared normal throughout the entire examined colon; multiple random biopsies were performed using cold forceps  RECOMMENDATIONS: 1.  Await biopsy results 2.  Trial of loperamide 2-4 mg each morning to help control loose stools.  Office follow-up next available  eSigned:  Jerene Bears, MD 10/07/2014 2:35 PM   cc: The Patient and Tula Nakayama, MD   PATIENT NAME:  Kendra, [name] MR#: [id number]

## 2014-10-07 NOTE — Progress Notes (Signed)
Called to room to assist during endoscopic procedure.  Patient ID and intended procedure confirmed with present staff. Received instructions for my participation in the procedure from the performing physician.  

## 2014-10-07 NOTE — Progress Notes (Signed)
Patient denies any allergies to eggs or soy. Patient denies any problems with anesthesia/sedation. Patient denies any oxygen use at home.

## 2014-10-07 NOTE — Progress Notes (Signed)
Procedure ends, to recovery, report given and VSS. 

## 2014-10-07 NOTE — Patient Instructions (Signed)
Discharge instructions given with verbal understanding. Handouts on diverticulosis and a high fiber diet. Biopsies taken. Resume previous medications. YOU HAD AN ENDOSCOPIC PROCEDURE TODAY AT [facility]: Refer to the procedure report that was given to you for any specific questions about what was found during the examination.  If the procedure report does not answer your questions, please call your gastroenterologist to clarify.  If you requested that your care partner not be given the details of your procedure findings, then the procedure report has been included in a sealed envelope for you to review at your convenience later.  YOU SHOULD EXPECT: Some feelings of bloating in the abdomen. Passage of more gas than usual.  Walking can help get rid of the air that was put into your GI tract during the procedure and reduce the bloating. If you had a lower endoscopy (such as a colonoscopy or flexible sigmoidoscopy) you may notice spotting of blood in your stool or on the toilet paper. If you underwent a bowel prep for your procedure, then you may not have a normal bowel movement for a few days.  DIET: Your first meal following the procedure should be a light meal and then it is ok to progress to your normal diet.  A half-sandwich or bowl of soup is an example of a good first meal.  Heavy or fried foods are harder to digest and may make you feel nauseous or bloated.  Likewise meals heavy in dairy and vegetables can cause extra gas to form and this can also increase the bloating.  Drink plenty of fluids but you should avoid alcoholic beverages for 24 hours.  ACTIVITY: Your care partner should take you home directly after the procedure.  You should plan to take it easy, moving slowly for the rest of the day.  You can resume normal activity the day after the procedure however you should NOT DRIVE or use heavy machinery for 24 hours (because of the sedation medicines used during the test).     SYMPTOMS TO REPORT IMMEDIATELY: A gastroenterologist can be reached at any hour.  During normal business hours, 8:30 AM to 5:00 PM Monday through Friday, call [phone].  After hours and on weekends, please call the GI answering service at [phone] who will take a message and have the physician on call contact you.   Following lower endoscopy (colonoscopy or flexible sigmoidoscopy):  Excessive amounts of blood in the stool  Significant tenderness or worsening of abdominal pains  Swelling of the abdomen that is new, acute  Fever of 100F or higher  FOLLOW UP: If any biopsies were taken you will be contacted by phone or by letter within the next 1-3 weeks.  Call your gastroenterologist if you have not heard about the biopsies in 3 weeks.  Our staff will call the home number listed on your records the next business day following your procedure to check on you and address any questions or concerns that you may have at that time regarding the information given to you following your procedure. This is a courtesy call and so if there is no answer at the home number and we have not heard from you through the emergency physician on call, we will assume that you have returned to your regular daily activities without incident.  SIGNATURES/CONFIDENTIALITY: You and/or your care partner have signed paperwork which will be entered into your electronic medical record.  These signatures attest to the fact that that the information above  on your After Visit Summary has been reviewed and is understood.  Full responsibility of the confidentiality of this discharge information lies with you and/or your care-partner.

## 2014-10-08 ENCOUNTER — Telehealth: Payer: Self-pay

## 2014-10-08 NOTE — Telephone Encounter (Signed)
  Follow up Call-  Call back number 10/07/2014  Post procedure Call Back phone  # home [phone]  Permission to leave phone message Yes     Patient questions:  Do you have a fever, pain , or abdominal swelling? No. Pain Score  0 *  Have you tolerated food without any problems? Yes.    Have you been able to return to your normal activities? Yes.    Do you have any questions about your discharge instructions: Diet   No. Medications  No. Follow up visit  No.  Do you have questions or concerns about your Care? No.  Actions: * If pain score is 4 or above: No action needed, pain <4.

## 2014-10-11 ENCOUNTER — Ambulatory Visit: Payer: Medicare Other

## 2014-10-14 ENCOUNTER — Ambulatory Visit: Payer: Medicare Other

## 2014-10-14 ENCOUNTER — Encounter: Payer: Self-pay | Admitting: Internal Medicine

## 2014-10-22 NOTE — Assessment & Plan Note (Signed)
Chronic nausea, dyspepsia and bloating, ultrasound ordered, will refer to GI if negative

## 2014-10-22 NOTE — Assessment & Plan Note (Signed)
Decongestant, anmd antibiotic and CXR

## 2014-10-22 NOTE — Assessment & Plan Note (Addendum)
Increased fall risk with fall history, home safety reviewed. Would benefit from PT, transport a problem, social work is working with the pt

## 2014-10-22 NOTE — Assessment & Plan Note (Signed)
Acute sinusitis, antibiotic prescribed

## 2014-10-24 ENCOUNTER — Ambulatory Visit: Payer: Medicare Other

## 2014-10-28 ENCOUNTER — Telehealth: Payer: Self-pay

## 2014-10-28 ENCOUNTER — Ambulatory Visit: Payer: Medicare Other

## 2014-10-28 ENCOUNTER — Telehealth: Payer: Self-pay | Admitting: Family Medicine

## 2014-10-28 MED ORDER — PERMETHRIN 5 % EX CREA: 1.0000 "application " | TOPICAL_CREAM | Freq: Once | CUTANEOUS | Status: DC

## 2014-10-28 NOTE — Addendum Note (Signed)
Addended by: Tula Nakayama E on: 10/28/2014 04:00 PM   Modules accepted: Orders

## 2014-10-28 NOTE — Telephone Encounter (Signed)
Noted.  Same pharmacy as in chart.  [facility] [address]

## 2014-10-28 NOTE — Telephone Encounter (Signed)
States her mother was diagnosed as having scabies and now she is beginning to itch and needs something called in for it and also she has had a cough for 2 weeks- worse at night and producing yellow phlegm. Please advise [facility] [address]

## 2014-10-28 NOTE — Telephone Encounter (Signed)
oTC robitussin dM as directed on bottle for cough Call pharmacy and see what is covered for scabies let me know and  I will prescribe

## 2014-10-28 NOTE — Telephone Encounter (Signed)
permethrin cream (generic for elimite)

## 2014-10-30 ENCOUNTER — Ambulatory Visit: Payer: Medicare Other

## 2014-11-04 ENCOUNTER — Ambulatory Visit: Payer: Medicare Other

## 2014-11-07 ENCOUNTER — Ambulatory Visit: Payer: Medicare Other

## 2014-11-11 ENCOUNTER — Ambulatory Visit: Payer: Medicare Other

## 2014-11-14 ENCOUNTER — Ambulatory Visit: Payer: Medicare Other

## 2014-11-18 ENCOUNTER — Ambulatory Visit: Payer: Medicare Other | Admitting: Internal Medicine

## 2014-11-18 ENCOUNTER — Ambulatory Visit (INDEPENDENT_AMBULATORY_CARE_PROVIDER_SITE_OTHER): Payer: Medicare Other | Admitting: Family Medicine

## 2014-11-18 ENCOUNTER — Encounter (INDEPENDENT_AMBULATORY_CARE_PROVIDER_SITE_OTHER): Payer: Self-pay

## 2014-11-18 ENCOUNTER — Encounter: Payer: Self-pay | Admitting: Family Medicine

## 2014-11-18 VITALS — HR 86 | Resp 18 | Ht 64.0 in | Wt 235.0 lb

## 2014-11-18 DIAGNOSIS — J302 Other seasonal allergic rhinitis: Secondary | ICD-10-CM

## 2014-11-18 DIAGNOSIS — R7301 Impaired fasting glucose: Secondary | ICD-10-CM | POA: Diagnosis not present

## 2014-11-18 DIAGNOSIS — M5126 Other intervertebral disc displacement, lumbar region: Secondary | ICD-10-CM | POA: Diagnosis not present

## 2014-11-18 DIAGNOSIS — I10 Essential (primary) hypertension: Secondary | ICD-10-CM | POA: Diagnosis not present

## 2014-11-18 DIAGNOSIS — Z0000 Encounter for general adult medical examination without abnormal findings: Secondary | ICD-10-CM | POA: Diagnosis not present

## 2014-11-18 DIAGNOSIS — J42 Unspecified chronic bronchitis: Secondary | ICD-10-CM | POA: Diagnosis not present

## 2014-11-18 DIAGNOSIS — M549 Dorsalgia, unspecified: Secondary | ICD-10-CM

## 2014-11-18 DIAGNOSIS — M5430 Sciatica, unspecified side: Secondary | ICD-10-CM

## 2014-11-18 DIAGNOSIS — H543 Unqualified visual loss, both eyes: Secondary | ICD-10-CM

## 2014-11-18 MED ORDER — FLUTICASONE PROPIONATE 50 MCG/ACT NA SUSP: 2.0000 | Freq: Every day | NASAL | Status: DC

## 2014-11-18 MED ORDER — AZITHROMYCIN 250 MG PO TABS: ORAL_TABLET | ORAL | Status: DC

## 2014-11-18 MED ORDER — LORATADINE 10 MG PO TABS: 10.0000 mg | ORAL_TABLET | Freq: Every day | ORAL | Status: DC

## 2014-11-18 MED ORDER — HYDROCODONE-ACETAMINOPHEN 5-325 MG PO TABS: 1.0000 | ORAL_TABLET | Freq: Four times a day (QID) | ORAL | Status: DC | PRN

## 2014-11-18 NOTE — Assessment & Plan Note (Signed)
antibioitc prescribed, pt to call in for pulmonary eval if he decides on this

## 2014-11-18 NOTE — Assessment & Plan Note (Addendum)
Uncontrolled ,start hydrocodone daily, warned to keep med safe and take as directed, increased fall risk discussed, pt stares med does not sedate her Pain contract signed

## 2014-11-18 NOTE — Patient Instructions (Addendum)
Annual physical exam in 4 month, call if you need me before  New for arthritis pain is hydrocodone one at night , you also need to sign a pain contract as we discussed and a specific person  will need to physically collect your script each month   Z pack sent for chronic bronchitis, call to see lung specialist when you decide on this  Commit to daily claritin and flonase for allergies  Work on living will  Careful not to fall  Depression seems related more to illness and death in close friends and family, your family keeps you going, no need to change dose of medcation  HBA1C and chem 7 today and TSH

## 2014-11-18 NOTE — Assessment & Plan Note (Signed)
Uncontrolled, explained need for daily use of necessary medication for improved control

## 2014-11-18 NOTE — Assessment & Plan Note (Signed)
Importance of regular f/u with eye specialist stressed , she doe have poor vision

## 2014-11-18 NOTE — Assessment & Plan Note (Signed)

## 2014-11-18 NOTE — Progress Notes (Signed)
Subjective:    Patient ID: [name], female    DOB: 13-Nov-1934, 80 y.o.   MRN: [id number]  HPI  Preventive Screening-Counseling & Management   Patient present here today for a subsequent  Medicare annual wellness visit. C/o uncontrolled arthritic pain, generalized in back , shoulders, hips and knees, states got better relief with hydrocodone so wants this back C/o chronic cough and chest congestion, states has yellow sputum, convinced something wrong in her chest c/o chronic left posterior chest discomfort , no interest in imaging or specialist eval now Not using allergy medication regularly and ahving chronic had congestion and drainage, also c/omitchy watery eyes    Current Problems (verified)   Medications Prior to Visit Allergies (verified)   PAST HISTORY  Family History (updated)  Social History retired Investment banker, corporate    Risk Factors  Current exercise habits: limited due to mobility  Dietary issues discussed: heart healthy, low salt diet   Cardiac risk factors: htn   Depression Screen  (Note: if answer to either of the following is "Yes", a more complete depression screening is indicated)  Mainly increased due to recent illneses in family and friends and death Over the past two weeks, have you felt down, depressed or hopeless? Yes, daily Over the past two weeks, have you felt little interest or pleasure in doing things? Yes  Have you lost interest or pleasure in daily life? No  Do you often feel hopeless? At times Do you cry easily over simple problems? No   Activities of Daily Living  In your present state of health, do you have any difficulty performing the following activities?  Driving?: Yes, does not drive Managing money?: No Feeding yourself?:No Getting from bed to chair?:No Climbing a flight of stairs?:Yes, due to knee and back problems Preparing food and eating?:No Bathing or showering?:No Getting dressed?:No Getting to the toilet?:No Using  the toilet?:No Moving around from place to place?: yes  Fall Risk Assessment In the past year have you fallen or had a near fall?:No Are you currently taking any medications that make you dizzy?:No   Hearing Difficulties: No Do you often ask people to speak up or repeat themselves?:No Do you experience ringing or noises in your ears?:No Do you have difficulty understanding soft or whispered voices?:No  Cognitive Testing  Alert? Yes Normal Appearance?Yes  Oriented to person? Yes Place? Yes  Time? Yes  Displays appropriate judgment?Yes  Can read the correct time from a watch face? yes Are you having problems remembering things?No  Advanced Directives have been discussed with the patient?Yes and brochure given , full code   List the Names of Other Physician/Practitioners you currently use: updated    Indicate any recent Medical Services you may have received from other than [facility] providers in the past year (date may be approximate).   Assessment:    Annual Wellness Exam   Plan:     Medicare Attestation  I have personally reviewed:  The patient's medical and social history  Their use of alcohol, tobacco or illicit drugs  Their current medications and supplements  The patient's functional ability including ADLs,fall risks, home safety risks, cognitive, and hearing and visual impairment  Diet and physical activities  Evidence for depression or mood disorders  The patient's weight, height, BMI, and visual acuity have been recorded in the chart. I have made referrals, counseling, and provided education to the patient based on review of the above and I have provided the patient with a written personalized  care plan for preventive services.     Review of Systems     Objective:   Physical Exam  Pulse 86  Resp 18  Ht 5\' 4"  (1.626 m)  Wt 235 lb (106.595 kg)  BMI 40.32 kg/m2  SpO2 93% Patient alert and oriented and in no cardiopulmonary distress.  HEENT: No facial  asymmetry, EOMI,   oropharynx pink and moist.  Neck adequate though reduced ROM no JVD, no mass.Eryhtema and edema of nasal mucosa, no sinus tenderness, TM clear, mld conjunctival injection bilaterally  Chest: adequate though reduced air entry, few scattered crackles and wheezes CVS: S1, S2 no murmurs, no S3.Regular rate.    DU:[id number]  ROM spine, shoulders, hips and knees.    Psych: Good eye contact, normal affect. Memory intact not anxious or depressed appearing.       Assessment & Plan:  Medicare annual wellness visit, subsequent Annual exam as documented. Counseling done  re healthy lifestyle involving commitment to 150 minutes exercise per week, heart healthy diet, and attaining healthy weight.The importance of adequate sleep also discussed. Regular seat belt use and home safety, is also discussed. Changes in health habits are decided on by the patient with goals and time frames  set for achieving them. Immunization and cancer screening needs are specifically addressed at this visit.   Vision loss, bilateral Importance of regular f/u with eye specialist stressed , she doe have poor vision  Seasonal allergies Uncontrolled, explained need for daily use of necessary medication for improved control  Back pain with radiation Uncontrolled ,start hydrocodone daily, warned to keep med safe and take as directed, increased fall risk discussed, pt stares med does not sedate her Pain contract signed  Chronic bronchitis antibioitc prescribed, pt to call in for pulmonary eval if he decides on this

## 2014-11-19 ENCOUNTER — Ambulatory Visit: Payer: Medicare Other

## 2014-11-19 LAB — HEMOGLOBIN A1C
HEMOGLOBIN A1C: 6.3 % — AB (ref <5.7)
MEAN PLASMA GLUCOSE: 134 mg/dL — AB (ref <117)

## 2014-11-19 LAB — BASIC METABOLIC PANEL
BUN: 11 mg/dL (ref 6–23)
CHLORIDE: 101 meq/L (ref 96–112)
CO2: 29 mEq/L (ref 19–32)
Calcium: 10 mg/dL (ref 8.4–10.5)
Creat: 0.58 mg/dL (ref 0.50–1.10)
Glucose, Bld: 89 mg/dL (ref 70–99)
POTASSIUM: 4.5 meq/L (ref 3.5–5.3)
Sodium: 140 mEq/L (ref 135–145)

## 2014-11-19 LAB — TSH: TSH: 1.345 u[IU]/mL (ref 0.350–4.500)

## 2014-11-26 ENCOUNTER — Ambulatory Visit: Payer: Medicare Other

## 2014-11-28 ENCOUNTER — Ambulatory Visit: Payer: Medicare Other

## 2014-12-13 ENCOUNTER — Other Ambulatory Visit: Payer: Self-pay

## 2014-12-13 DIAGNOSIS — M5126 Other intervertebral disc displacement, lumbar region: Secondary | ICD-10-CM

## 2014-12-13 MED ORDER — HYDROCODONE-ACETAMINOPHEN 5-325 MG PO TABS: 1.0000 | ORAL_TABLET | Freq: Four times a day (QID) | ORAL | Status: DC | PRN

## 2014-12-25 ENCOUNTER — Telehealth: Payer: Self-pay | Admitting: *Deleted

## 2014-12-25 ENCOUNTER — Other Ambulatory Visit: Payer: Self-pay

## 2014-12-25 DIAGNOSIS — G4700 Insomnia, unspecified: Secondary | ICD-10-CM

## 2014-12-25 MED ORDER — TEMAZEPAM 15 MG PO CAPS: 15.0000 mg | ORAL_CAPSULE | Freq: Every evening | ORAL | Status: DC | PRN

## 2014-12-25 NOTE — Telephone Encounter (Signed)
Spoke with patient and Temazepam refilled

## 2014-12-25 NOTE — Telephone Encounter (Signed)
Pt called requesting medication refill on her sleeping pills. Pt said they expired 01/2014. Please advise [phone]

## 2015-01-09 ENCOUNTER — Ambulatory Visit: Payer: Medicare Other | Admitting: Internal Medicine

## 2015-03-19 ENCOUNTER — Encounter: Payer: Self-pay | Admitting: Family Medicine

## 2015-03-19 ENCOUNTER — Ambulatory Visit (INDEPENDENT_AMBULATORY_CARE_PROVIDER_SITE_OTHER): Payer: Medicare Other | Admitting: Family Medicine

## 2015-03-19 VITALS — BP 134/82 | HR 76 | Resp 18 | Ht 64.0 in | Wt 239.0 lb

## 2015-03-19 DIAGNOSIS — H543 Unqualified visual loss, both eyes: Secondary | ICD-10-CM

## 2015-03-19 DIAGNOSIS — E785 Hyperlipidemia, unspecified: Secondary | ICD-10-CM

## 2015-03-19 DIAGNOSIS — Z23 Encounter for immunization: Secondary | ICD-10-CM | POA: Diagnosis not present

## 2015-03-19 DIAGNOSIS — Z1211 Encounter for screening for malignant neoplasm of colon: Secondary | ICD-10-CM | POA: Diagnosis not present

## 2015-03-19 DIAGNOSIS — Z0000 Encounter for general adult medical examination without abnormal findings: Secondary | ICD-10-CM | POA: Insufficient documentation

## 2015-03-19 DIAGNOSIS — H547 Unspecified visual loss: Secondary | ICD-10-CM

## 2015-03-19 LAB — HEMOCCULT GUIAC POC 1CARD (OFFICE): FECAL OCCULT BLD: NEGATIVE

## 2015-03-19 NOTE — Assessment & Plan Note (Signed)

## 2015-03-19 NOTE — Patient Instructions (Signed)
F/u in 4.5 month, call if you need me before  You are referred for eye exam due to poor vision  Prevnar today  Take loratidine every day for allergies please  Pls try to keep house clutter free so you do not fall  CBC, fasting lipid, chem 7 in 4.5 months, before follow up

## 2015-03-30 NOTE — Assessment & Plan Note (Signed)
After obtaining informed consent, the vaccine is  administered by LPN.  

## 2015-03-30 NOTE — Progress Notes (Signed)
   Subjective:    Patient ID: [name], female    DOB: 11-20-1934, 81 y.o.   MRN: [id number]  HPI Patient is in for annual pelvic and breast  exam.  Recent labs, if available are reviewed. Immunization is reviewed , and  updated if needed. C/o increased low back pain following fall 1 week ago, does not wish for any medication for this as improving    Review of Systems See HPI     Objective:   Physical Exam BP 134/82 mmHg  Pulse 76  Resp 18  Ht 5\' 4"  (1.626 m)  Wt 239 lb (108.41 kg)  BMI 41.00 kg/m2  SpO2 94% Pleasant obese  female, alert and oriented x 3, in no cardio-pulmonary distress.Marked limitation in mobility due to severe osteoarthritis spine, hips, knees and shoulders Afebrile. Breast: No asymetry,no masses or lumps. No tenderness. No nipple discharge or inversion. No axillary or supraclavicular adenopathy  .  Marland Kitchen  Rectal:  Normal sphincter tone. No mass.No rectal masses.  Guaiac negative stool.  GU: External genitalia normal female genitalia , female distribution of hair. No lesions. Urethral meatus normal in size, no  Prolapse, no lesions visibly  Present. Bladder non tender. Vagina pink and moist , with no visible lesions , discharge present . Adequate pelvic support no  cystocele or rectocele noted  Uterus absent, no adnexal masses palpable         Assessment & Plan:  Annual physical exam Annual exam as documented. Counseling done  re healthy lifestyle involving commitment to 150 minutes exercise per week, heart healthy diet, and attaining healthy weight.The importance of adequate sleep also discussed. Regular seat belt use and home safety, is also discussed. Changes in health habits are decided on by the patient with goals and time frames  set for achieving them. Immunization and cancer screening needs are specifically addressed at this visit.    Need for vaccination with 13-polyvalent pneumococcal conjugate vaccine After obtaining  informed consent, the vaccine is  administered by LPN.    Reduced vision Reduced vision, needs to find a new Md who accepts her insurance , referral enetered

## 2015-03-30 NOTE — Assessment & Plan Note (Signed)
Reduced vision, needs to find a new Md who accepts her insurance , referral enetered

## 2015-04-10 ENCOUNTER — Other Ambulatory Visit: Payer: Self-pay

## 2015-04-10 DIAGNOSIS — M5126 Other intervertebral disc displacement, lumbar region: Secondary | ICD-10-CM

## 2015-04-10 MED ORDER — HYDROCODONE-ACETAMINOPHEN 5-325 MG PO TABS: 1.0000 | ORAL_TABLET | Freq: Four times a day (QID) | ORAL | Status: DC | PRN

## 2015-07-30 ENCOUNTER — Encounter: Payer: Self-pay | Admitting: *Deleted

## 2015-07-30 ENCOUNTER — Ambulatory Visit: Payer: Medicare Other | Admitting: Family Medicine

## 2015-08-01 ENCOUNTER — Other Ambulatory Visit: Payer: Self-pay

## 2015-08-01 DIAGNOSIS — J302 Other seasonal allergic rhinitis: Secondary | ICD-10-CM

## 2015-08-01 DIAGNOSIS — J4520 Mild intermittent asthma, uncomplicated: Secondary | ICD-10-CM

## 2015-08-01 MED ORDER — FLUTICASONE PROPIONATE 50 MCG/ACT NA SUSP: 2.0000 | Freq: Every day | NASAL | Status: DC

## 2015-08-01 MED ORDER — ALBUTEROL SULFATE HFA 108 (90 BASE) MCG/ACT IN AERS: 2.0000 | INHALATION_SPRAY | Freq: Four times a day (QID) | RESPIRATORY_TRACT | Status: DC | PRN

## 2015-08-25 ENCOUNTER — Encounter: Payer: Self-pay | Admitting: Family Medicine

## 2015-08-25 ENCOUNTER — Other Ambulatory Visit: Payer: Self-pay

## 2015-08-25 ENCOUNTER — Ambulatory Visit (INDEPENDENT_AMBULATORY_CARE_PROVIDER_SITE_OTHER): Payer: Medicare Other | Admitting: Family Medicine

## 2015-08-25 VITALS — BP 150/84 | HR 95 | Resp 18 | Ht 64.0 in | Wt 244.1 lb

## 2015-08-25 DIAGNOSIS — F418 Other specified anxiety disorders: Secondary | ICD-10-CM

## 2015-08-25 DIAGNOSIS — I10 Essential (primary) hypertension: Secondary | ICD-10-CM | POA: Diagnosis not present

## 2015-08-25 DIAGNOSIS — F419 Anxiety disorder, unspecified: Principal | ICD-10-CM

## 2015-08-25 DIAGNOSIS — E785 Hyperlipidemia, unspecified: Secondary | ICD-10-CM

## 2015-08-25 DIAGNOSIS — R053 Chronic cough: Secondary | ICD-10-CM

## 2015-08-25 DIAGNOSIS — M5126 Other intervertebral disc displacement, lumbar region: Secondary | ICD-10-CM | POA: Diagnosis not present

## 2015-08-25 DIAGNOSIS — F329 Major depressive disorder, single episode, unspecified: Secondary | ICD-10-CM

## 2015-08-25 DIAGNOSIS — R7302 Impaired glucose tolerance (oral): Secondary | ICD-10-CM | POA: Diagnosis not present

## 2015-08-25 DIAGNOSIS — Z23 Encounter for immunization: Secondary | ICD-10-CM | POA: Diagnosis not present

## 2015-08-25 DIAGNOSIS — R062 Wheezing: Secondary | ICD-10-CM

## 2015-08-25 DIAGNOSIS — J302 Other seasonal allergic rhinitis: Secondary | ICD-10-CM

## 2015-08-25 DIAGNOSIS — R05 Cough: Secondary | ICD-10-CM

## 2015-08-25 DIAGNOSIS — G479 Sleep disorder, unspecified: Secondary | ICD-10-CM | POA: Diagnosis not present

## 2015-08-25 DIAGNOSIS — J4530 Mild persistent asthma, uncomplicated: Secondary | ICD-10-CM

## 2015-08-25 LAB — CBC
HCT: 42.3 % (ref 36.0–46.0)
Hemoglobin: 13.5 g/dL (ref 12.0–15.0)
MCH: 26.4 pg (ref 26.0–34.0)
MCHC: 31.9 g/dL (ref 30.0–36.0)
MCV: 82.8 fL (ref 78.0–100.0)
MPV: 9.9 fL (ref 8.6–12.4)
PLATELETS: 360 10*3/uL (ref 150–400)
RBC: 5.11 MIL/uL (ref 3.87–5.11)
RDW: 15.6 % — ABNORMAL HIGH (ref 11.5–15.5)
WBC: 10.4 10*3/uL (ref 4.0–10.5)

## 2015-08-25 LAB — LIPID PANEL
CHOL/HDL RATIO: 3.4 ratio (ref <=5.0)
CHOLESTEROL: 197 mg/dL (ref 125–200)
HDL: 58 mg/dL (ref >=46)
LDL Cholesterol: 109 mg/dL (ref <130)
TRIGLYCERIDES: 150 mg/dL — AB (ref <150)
VLDL: 30 mg/dL (ref <30)

## 2015-08-25 LAB — COMPLETE METABOLIC PANEL WITH GFR
ALBUMIN: 4.2 g/dL (ref 3.6–5.1)
ALK PHOS: 64 U/L (ref 33–130)
ALT: 21 U/L (ref 6–29)
AST: 17 U/L (ref 10–35)
BILIRUBIN TOTAL: 0.7 mg/dL (ref 0.2–1.2)
BUN: 14 mg/dL (ref 7–25)
CALCIUM: 9.6 mg/dL (ref 8.6–10.4)
CO2: 27 mmol/L (ref 20–31)
Chloride: 102 mmol/L (ref 98–110)
Creat: 0.69 mg/dL (ref 0.60–0.93)
GFR, Est African American: >89 mL/min (ref >=60)
GFR, Est Non African American: 83 mL/min (ref >=60)
GLUCOSE: 89 mg/dL (ref 65–99)
POTASSIUM: 4.8 mmol/L (ref 3.5–5.3)
SODIUM: 143 mmol/L (ref 135–146)
TOTAL PROTEIN: 7.3 g/dL (ref 6.1–8.1)

## 2015-08-25 LAB — TSH: TSH: 1.514 u[IU]/mL (ref 0.350–4.500)

## 2015-08-25 MED ORDER — AMLODIPINE BESYLATE 2.5 MG PO TABS: 2.5000 mg | ORAL_TABLET | Freq: Every day | ORAL | Status: DC

## 2015-08-25 MED ORDER — LORATADINE 10 MG PO TABS: 10.0000 mg | ORAL_TABLET | Freq: Every day | ORAL | Status: DC

## 2015-08-25 MED ORDER — TEMAZEPAM 7.5 MG PO CAPS: 7.5000 mg | ORAL_CAPSULE | Freq: Every evening | ORAL | Status: DC | PRN

## 2015-08-25 MED ORDER — HYDROCODONE-ACETAMINOPHEN 5-325 MG PO TABS: 1.0000 | ORAL_TABLET | Freq: Four times a day (QID) | ORAL | Status: DC | PRN

## 2015-08-25 MED ORDER — KETOROLAC TROMETHAMINE 60 MG/2ML IM SOLN
60.0000 mg | Freq: Once | INTRAMUSCULAR | Status: AC
  Administered 2015-08-25: 60 mg via INTRAMUSCULAR

## 2015-08-25 MED ORDER — FLUOXETINE HCL (PMDD) 10 MG PO TABS: 1.0000 | ORAL_TABLET | Freq: Every day | ORAL | Status: DC

## 2015-08-25 MED ORDER — METHYLPREDNISOLONE ACETATE 80 MG/ML IJ SUSP
80.0000 mg | Freq: Once | INTRAMUSCULAR | Status: AC
  Administered 2015-08-25: 80 mg via INTRAMUSCULAR

## 2015-08-25 NOTE — Patient Instructions (Addendum)
Annual wellness in January, call if you need me ebfore  Flu vaccine today  Toradol and depo medrol in office for back pain, and script for hydrocodone to last 4 months is given   Labs today CBC, lipid, cmp and eGFr, HBa1C, TSH   New for depression is fluoxetine daily  New for blood pressure is amlodipine daily  For right trigger thumb , call when ready  Restoril  will be refilled for sleep  You are referred to lung specialist re sleep disorder / snoring  Pls cut back on calories and try yo move a bit more so that you lose weight  Keep doing the best that you can for the littlest ones in your life, they need you!

## 2015-08-26 LAB — HEMOGLOBIN A1C
Hgb A1c MFr Bld: 6.4 % — ABNORMAL HIGH (ref <5.7)
Mean Plasma Glucose: 137 mg/dL — ABNORMAL HIGH (ref <117)

## 2015-09-02 ENCOUNTER — Telehealth: Payer: Self-pay | Admitting: *Deleted

## 2015-09-02 NOTE — Telephone Encounter (Signed)
[name] called this morning for Dr. Jeannine Kitten phone number, and I looked up phone number for patient [phone] and I gave that to her a while later unconfirmed female called LMOM and didn't leave a return call back number and the message only stated I gave the wrong number, less than an hour later unconfirmed female called back stating I gave her the wrong F-ING number and she didn't know why I was sitting here because I can't do my damn job and can't even get a phone number right, she asked me what do I do and I made her aware I am the receptionist pt stated she didn't know why the hell [name] had me here because I can't even get a damn number right. I asked pt what could I help her with pt stated I can't even give a damn right number, I asked the patient her name and she hung up, I can not confirm if this is the same patient who asked me for [name]'s number this am but as of right now,  I only had to look up one number for a patient and that was for Kendra Verge to [name] office. Me and [name] both called the number I gave patient and it is the correct number to [name].

## 2015-09-02 NOTE — Telephone Encounter (Signed)
Noted the conversation, and  will need to discuss with  the  office manager  appropriate action to be taken as person who called on pt's behalf was extremely rude to staff member , and this is inappropriate and unexpectable

## 2015-09-03 ENCOUNTER — Institutional Professional Consult (permissible substitution): Payer: Medicare Other | Admitting: Pulmonary Disease

## 2015-09-04 DIAGNOSIS — Z23 Encounter for immunization: Secondary | ICD-10-CM | POA: Insufficient documentation

## 2015-09-04 NOTE — Assessment & Plan Note (Signed)
Patient educated about the importance of limiting  Carbohydrate intake , the need to commit to daily physical activity for a minimum of 30 minutes , and to commit weight loss. The fact that changes in all these areas will reduce or eliminate all together the development of diabetes is stressed.  Deteriorated  Diabetic Labs Latest Ref Rng 08/25/2015 11/18/2014 05/13/2014 08/07/2013 01/06/2012  HbA1c <5.7 % 6.4(H) 6.3(H) 6.6(H) 6.1(H) 6.4(H)  Chol 125 - 200 mg/dL 197 - 209(H) 227(H) 216(H)  HDL >=46 mg/dL 58 - 80 66 71  Calc LDL <130 mg/dL 109 - 100(H) 119(H) 116(H)  Triglycerides <150 mg/dL 150(H) - 143 211(H) 146  Creatinine 0.60 - 0.93 mg/dL 0.69 0.58 0.72 0.75 0.71   BP/Weight 08/25/2015 03/19/2015 11/18/2014 10/07/2014 09/30/2014 07/11/2014 7/67/3419  Systolic BP 379 024 - 097 353 299 242  Diastolic BP 84 82 - 96 82 80 82  Wt. (Lbs) 244.08 239 235 234 234.25 228 231.04  BMI 41.88 41 40.32 40.15 40.19 39.12 39.64   No flowsheet data found.

## 2015-09-04 NOTE — Assessment & Plan Note (Signed)
Uncontrolled, reports having no medication Script sent in to pharmacy , and the importance of compliance is discussed  DASH diet and commitment to daily physical activity for a minimum of 30 minutes discussed and encouraged, as a part of hypertension management. The importance of attaining a healthy weight is also discussed.  BP/Weight 08/25/2015 03/19/2015 11/18/2014 10/07/2014 09/30/2014 07/11/2014 7/49/4496  Systolic BP 759 163 - 846 659 935 701  Diastolic BP 84 82 - 96 82 80 82  Wt. (Lbs) 244.08 239 235 234 234.25 228 231.04  BMI 41.88 41 40.32 40.15 40.19 39.12 39.64

## 2015-09-04 NOTE — Assessment & Plan Note (Signed)
Worsening symptom, however elects to hold off on ortho eval at this time

## 2015-09-04 NOTE — Assessment & Plan Note (Signed)
Reports night time wheezing and excess snoring with chronic fatigue, will refer to pulmonary for further eval and management

## 2015-09-04 NOTE — Assessment & Plan Note (Addendum)
Uncontrolled.Toradol and depo medrol administered IM in the office , and script for limited supply of hydrocodmne provided

## 2015-09-04 NOTE — Assessment & Plan Note (Signed)
Hyperlipidemia:Low fat diet discussed and encouraged.   Lipid Panel  Lab Results  Component Value Date   CHOL 197 08/25/2015   HDL 58 08/25/2015   LDLCALC 109 08/25/2015   TRIG 150* 08/25/2015   CHOLHDL 3.4 08/25/2015   Low fat diet encouraged

## 2015-09-04 NOTE — Assessment & Plan Note (Signed)
Deteriorated. Patient re-educated about  the importance of commitment to a  minimum of 150 minutes of exercise per week.  The importance of healthy food choices with portion control discussed. Encouraged to start a food diary, count calories and to consider  joining a support group. Sample diet sheets offered. Goals set by the patient for the next several months.   Weight /BMI 08/25/2015 03/19/2015 11/18/2014  WEIGHT 244 lb 1.3 oz 239 lb 235 lb  HEIGHT 5\' 4"  5\' 4"  5\' 4"   BMI 41.88 kg/m2 41 kg/m2 40.32 kg/m2    Current exercise per week 30 mins

## 2015-09-04 NOTE — Assessment & Plan Note (Signed)
Stressed out over situation with her family, not suicidal or homicidal, the grand children who she cares for are a source of encouragement and a will to live. Start fluoxetine. Pt is not suicidal or homicidal

## 2015-09-04 NOTE — Progress Notes (Signed)
Subjective:    Patient ID: [name], female    DOB: March 17, 1934, 81 y.o.   MRN: [id number]  HPI  The PT is here for follow up and re-evaluation of chronic medical conditions, medication management and review of any available recent lab and radiology data.  Preventive health is updated, specifically   Immunization.   . The PT denies any adverse reactions to current medications since the last visit. Unfortunately she does not have a lot of the medications which she should  C/o increased low back pain, requests injections as well as change in chronic pain management C/o feeling stressed out and depressed often, main issue being the fact that the patrwents of her grand children are not carrying their weight in rasing the children and per pt report are "not doing right", so this has her constantly overwhelmed and sad. Does report excellent family support in terms of providing for the physical needs of the children C/o poor sleep, and difficulty with mobility due to uncontrolled chronic back pain, denies falls, but often does not feel "steady " on her feet. C/o excessive and increased snoring, and also wheezing esp at night   Review of Systems See HPI Denies recent fever or chills. Denies sinus pressure, nasal congestion, ear pain or sore throat. Denies chest congestion, productive cough Denies chest pains, palpitations and leg swelling Denies abdominal pain, nausea, vomiting,diarrhea or constipation.   Denies dysuria, frequency, hesitancy or incontinence  Denies headaches, seizures, numbness, or tingling.  Denies skin break down or rash.        Objective:   Physical Exam BP 150/84 mmHg  Pulse 95  Resp 18  Ht 5\' 4"  (1.626 m)  Wt 244 lb 1.3 oz (110.714 kg)  BMI 41.88 kg/m2  SpO2 93%  Patient alert and oriented and in no cardiopulmonary distress.  HEENT: No facial asymmetry, EOMI,   oropharynx pink and moist.  Neck decreased ROM, no JVD, no mass.  Chest: Clear to  auscultation bilaterally.No crackles or wheezes, decreased though adequate air entry CVS: S1, S2 no murmurs, no S3.Regular rate.  ABD: Soft non tender. Obese  Ext: No edema  JS:HFWYOVZCH ROM lumbar spine , hips and knees  Skin: Intact, no ulcerations or rash noted.  Psych: Good eye contact, normal affect. Memory intact not anxious or depressed appearing.  CNS: CN 2-12 intact, power,  normal throughout.no focal deficits noted.       Assessment & Plan:  HTN (hypertension), benign Uncontrolled, reports having no medication Script sent in to pharmacy , and the importance of compliance is discussed  DASH diet and commitment to daily physical activity for a minimum of 30 minutes discussed and encouraged, as a part of hypertension management. The importance of attaining a healthy weight is also discussed.  BP/Weight 08/25/2015 03/19/2015 11/18/2014 10/07/2014 09/30/2014 07/11/2014 8/85/0277  Systolic BP 412 878 - 676 720 947 096  Diastolic BP 84 82 - 96 82 80 82  Wt. (Lbs) 244.08 239 235 234 234.25 228 231.04  BMI 41.88 41 40.32 40.15 40.19 39.12 39.64        IGT (impaired glucose tolerance) Patient educated about the importance of limiting  Carbohydrate intake , the need to commit to daily physical activity for a minimum of 30 minutes , and to commit weight loss. The fact that changes in all these areas will reduce or eliminate all together the development of diabetes is stressed.  Deteriorated  Diabetic Labs Latest Ref Rng 08/25/2015 11/18/2014 05/13/2014 08/07/2013 01/06/2012  HbA1c <5.7 % 6.4(H) 6.3(H) 6.6(H) 6.1(H) 6.4(H)  Chol 125 - 200 mg/dL 197 - 209(H) 227(H) 216(H)  HDL >=46 mg/dL 58 - 80 66 71  Calc LDL <130 mg/dL 109 - 100(H) 119(H) 116(H)  Triglycerides <150 mg/dL 150(H) - 143 211(H) 146  Creatinine 0.60 - 0.93 mg/dL 0.69 0.58 0.72 0.75 0.71   BP/Weight 08/25/2015 03/19/2015 11/18/2014 10/07/2014 09/30/2014 07/11/2014 1/96/2229  Systolic BP 798 921 - 194 148 174 081    Diastolic BP 84 82 - 96 82 80 82  Wt. (Lbs) 244.08 239 235 234 234.25 228 231.04  BMI 41.88 41 40.32 40.15 40.19 39.12 39.64   No flowsheet data found.     Anxiety and depression Stressed out over situation with her family, not suicidal or homicidal, the grand children who she cares for are a source of encouragement and a will to live. Start fluoxetine. Pt is not suicidal or homicidal  Hyperlipemia Hyperlipidemia:Low fat diet discussed and encouraged.   Lipid Panel  Lab Results  Component Value Date   CHOL 197 08/25/2015   HDL 58 08/25/2015   LDLCALC 109 08/25/2015   TRIG 150* 08/25/2015   CHOLHDL 3.4 08/25/2015   Low fat diet encouraged     Sleep disorder Sleep hygiene reviewed and written information offered also. Prescription sent for  medication needed.   Trigger thumb of right hand Worsening symptom, however elects to hold off on ortho eval at this time  Asthma, chronic Reports night time wheezing and excess snoring with chronic fatigue, will refer to pulmonary for further eval and management  DEGENERATIVE DISC DISEASE, LUMBOSACRAL SPINE W/RADICULOPATHY Uncontrolled.Toradol and depo medrol administered IM in the office , and script for limited supply of hydrocodmne provided  Morbid obesity Deteriorated. Patient re-educated about  the importance of commitment to a  minimum of 150 minutes of exercise per week.  The importance of healthy food choices with portion control discussed. Encouraged to start a food diary, count calories and to consider  joining a support group. Sample diet sheets offered. Goals set by the patient for the next several months.   Weight /BMI 08/25/2015 03/19/2015 11/18/2014  WEIGHT 244 lb 1.3 oz 239 lb 235 lb  HEIGHT 5\' 4"  5\' 4"  5\' 4"   BMI 41.88 kg/m2 41 kg/m2 40.32 kg/m2    Current exercise per week 30 mins   Need for prophylactic vaccination and inoculation against influenza After obtaining informed consent, the vaccine is   administered by LPN.

## 2015-09-04 NOTE — Assessment & Plan Note (Signed)
After obtaining informed consent, the vaccine is  administered by LPN.  

## 2015-09-04 NOTE — Assessment & Plan Note (Signed)
Sleep hygiene reviewed and written information offered also. Prescription sent for  medication needed.  

## 2015-09-11 NOTE — Patient Outreach (Signed)
[name]) Care Management  09/11/2015  [name] March 05, 1934 [id number]   Transportation arranged with [employer] to patient appointment on 09/15/2015 with [facility]. Patient notified of arrangements as well as Judeen Hammans at [employer].  Leilene Diprima L. Alexiz Cothran, [facility] Care Management Assistant

## 2015-09-15 ENCOUNTER — Ambulatory Visit (INDEPENDENT_AMBULATORY_CARE_PROVIDER_SITE_OTHER)
Admission: RE | Admit: 2015-09-15 | Discharge: 2015-09-15 | Disposition: A | Discharge status: Home / Self Care | Payer: Medicare Other | Source: Ambulatory Visit | Attending: Pulmonary Disease | Admitting: Pulmonary Disease

## 2015-09-15 ENCOUNTER — Encounter: Payer: Self-pay | Admitting: Pulmonary Disease

## 2015-09-15 ENCOUNTER — Ambulatory Visit (INDEPENDENT_AMBULATORY_CARE_PROVIDER_SITE_OTHER): Payer: Medicare Other | Admitting: Pulmonary Disease

## 2015-09-15 DIAGNOSIS — J45909 Unspecified asthma, uncomplicated: Secondary | ICD-10-CM | POA: Diagnosis not present

## 2015-09-15 DIAGNOSIS — R0600 Dyspnea, unspecified: Secondary | ICD-10-CM

## 2015-09-15 MED ORDER — MOMETASONE FURO-FORMOTEROL FUM 100-5 MCG/ACT IN AERO: 2.0000 | INHALATION_SPRAY | Freq: Every day | RESPIRATORY_TRACT | Status: DC

## 2015-09-15 NOTE — Progress Notes (Signed)
Subjective:    Patient ID: [name], female    DOB: Sep 08, 1934, 81 y.o.   MRN: [id number]  HPI Referral for evaluation of cough with wheezing.  Mrs. Martinezgarcia is a 81 year old with history of childhood asthma. Complains of cough which is nonproductive in nature associated with wheezing and shortness of breath. His symptoms have been present for many years now but worsening over the past few months. She also complains of some chest pain worsened on deep inhalation and coughing. She has never seen a pulmonologist in the past. She did have PFTs in 2012 which showed a restrictive lung disease and reduction in DLCO. No obstruction or a bronchodilator response. A CT scan the following year showed mild cylindrical bronchiectasis.  She is on albuterol which she takes twice a day. She says that this helps with her symptoms. Has history of allergies and is on Flonase and Claritin. She smoked about half pack a day for 30 years. Quit in 1990.   Past Medical History  Diagnosis Date  . High blood pressure   . High cholesterol   . Headache(784.0)   . Urinary incontinence   . Obesity   . Hyperlipidemia   . Hypertension   . Sciatica   . Back pain   . Family history of arthritis   . Family history of ischemic heart disease   . Family history of diabetes mellitus   . Personal history of unspecified circulatory disease   . Shortness of breath   . Asthma   . Depression   . Pain from implanted hardware 01/28/2012     Current outpatient prescriptions:  .  albuterol (PROVENTIL HFA;VENTOLIN HFA) 108 (90 BASE) MCG/ACT inhaler, Inhale 2 puffs into the lungs every 6 (six) hours as needed for wheezing., Disp: 1 Inhaler, Rfl: 3 .  amLODipine (NORVASC) 2.5 MG tablet, Take 1 tablet (2.5 mg total) by mouth daily., Disp: 30 tablet, Rfl: 3 .  Cholecalciferol (VITAMIN D PO), Take 1 tablet by mouth daily., Disp: , Rfl:  .  Fluoxetine HCl, PMDD, 10 MG TABS, Take 1 tablet (10 mg total) by mouth daily., Disp: 30  tablet, Rfl: 4 .  fluticasone (FLONASE) 50 MCG/ACT nasal spray, Place 2 sprays into both nostrils daily., Disp: 16 g, Rfl: 6 .  HYDROcodone-acetaminophen (NORCO/VICODIN) 5-325 MG per tablet, Take 1 tablet by mouth every 6 (six) hours as needed for moderate pain., Disp: 30 tablet, Rfl: 0 .  loratadine (CLARITIN) 10 MG tablet, Take 1 tablet (10 mg total) by mouth daily., Disp: 30 tablet, Rfl: 11 .  temazepam (RESTORIL) 7.5 MG capsule, Take 1 capsule (7.5 mg total) by mouth at bedtime as needed for sleep., Disp: 30 capsule, Rfl: 4   Review of Systems  Constitutional: Negative for fever, chills and unexpected weight change.  HENT: Negative for congestion, dental problem, ear pain, nosebleeds, postnasal drip, rhinorrhea, sinus pressure, sneezing, sore throat, trouble swallowing and voice change.   Eyes: Negative for visual disturbance.  Respiratory: Positive for cough, shortness of breath and wheezing. Negative for choking.   Cardiovascular: Positive for chest pain. Negative for leg swelling.  Gastrointestinal: Negative for vomiting, abdominal pain and diarrhea.  Genitourinary: Negative for difficulty urinating.  Musculoskeletal: Negative for arthralgias.  Skin: Negative for rash.  Neurological: Negative for tremors, syncope and headaches.  Hematological: Does not bruise/bleed easily.    Data: PFTs: (08/10/11) FVC 1.40 (54%) FEV1 1.35 (66%) F/F 81 TLC 4.77 (68%) DLCO 58% Np BD response.  CT chest (07/25/12) 1. No  acute cardiopulmonary disease. 2. Atherosclerosis including coronary artery disease 3. Mild cylindrical bronchiectasis with associated scarring and architectural distortion in the medial aspect of the right lower lobe. This likely reflects the sequela of a prior infectious/inflammatory process. 4. Hepatic steatosis  CXR (06/19/14) No acute cardiopulmonary abnormality seen.  Blood pressure 136/84, pulse 105, temperature 98.1 F (36.7 C), temperature source Oral, height  5\' 4"  (1.626 m), weight 238 lb (107.956 kg), SpO2 96 %.     Objective:   Physical Exam  Constitutional: She is oriented to person, place, and time. She appears well-developed and well-nourished.  HENT:  Mouth/Throat: Oropharynx is clear and moist.  Eyes: Conjunctivae are normal. Pupils are equal, round, and reactive to light.  Neck: Normal range of motion. Neck supple.  Cardiovascular: Normal rate, regular rhythm and normal heart sounds.   Pulmonary/Chest: Effort normal and breath sounds normal.  Abdominal: Soft. Bowel sounds are normal.  Neurological: She is alert and oriented to person, place, and time.  Skin: Skin is warm and dry.      Assessment & Plan:  Mrs. Freeburg is likely has reactive avid disease/asthma. Allergies and postnasal drip or contributing to her dyspnea and wheezing. She does have some chest pain associated with coughing. This may be musculoskeletal she has tenderness on palpation.  I will start her on Dulera. Continue albuterol, get a chest x-ray and PFTs. Reassess in a month or two. If her symptoms continue then we may need to assess with a CT scan of the chest. A pulmonary embolus is lower on the differential since her dyspnea is associated with wheezing. But we'll continue to monitor her response to therapy.  Plan: - Start dulera.  - Continue albuterol, flonase and claritin. - PFTs with BD response - Chest X ray.  RTC in 1-2 months to assess response to therapy.

## 2015-09-15 NOTE — Patient Instructions (Signed)
Start dulera inhaler Continue albuterol inhaler Pulmonary function tests X ray of chest  Return to see me in 1-2 months.

## 2015-10-09 ENCOUNTER — Telehealth: Payer: Self-pay | Admitting: Pulmonary Disease

## 2015-10-09 NOTE — Telephone Encounter (Signed)
Pt seen by PM on 9.19.16 and rx'd [facility] Received letter from [place] stating that Ruthe Mannan is not covered and she must try a preferred medication first Per PM, okay to change the Dulera to Adviar 250-50 1 puff BID  LMOM TCB x1 for pt - would like to speak with her prior to rx'ing medication Will route back to my inbox for follow up

## 2015-10-10 MED ORDER — FLUTICASONE-SALMETEROL 250-50 MCG/DOSE IN AEPB: 1.0000 | INHALATION_SPRAY | Freq: Two times a day (BID) | RESPIRATORY_TRACT | Status: DC

## 2015-10-10 NOTE — Telephone Encounter (Signed)
Called spoke with pt. Aware Ruthe Mannan is not covered by insurance and she must first try/fail advair.  She was fine with this and asked for RX to be sent to [facility]. I have done so. Nothing further needed

## 2015-11-24 ENCOUNTER — Ambulatory Visit: Payer: Medicare Other | Admitting: Pulmonary Disease

## 2016-01-14 ENCOUNTER — Ambulatory Visit: Payer: Medicare Other | Admitting: Family Medicine

## 2016-01-20 ENCOUNTER — Telehealth: Payer: Self-pay

## 2016-01-20 DIAGNOSIS — R252 Cramp and spasm: Secondary | ICD-10-CM

## 2016-01-20 DIAGNOSIS — I10 Essential (primary) hypertension: Secondary | ICD-10-CM

## 2016-01-20 DIAGNOSIS — E559 Vitamin D deficiency, unspecified: Secondary | ICD-10-CM

## 2016-01-20 DIAGNOSIS — R7302 Impaired glucose tolerance (oral): Secondary | ICD-10-CM

## 2016-01-20 NOTE — Telephone Encounter (Signed)
She may get lab in [place] or the day she comes if more convenient , NO NEED TO FAST , pls let her know Needs CMP and EGFR, HBA1C, and Vit D, magnesium , also B12 level  Pls give her an appointment for next week, Recommend  She ensures adequate fluid intake , at least 64 ounces water daily, also stretching the legs before sleep, and take 1 multivit daily that has in B12 ,

## 2016-01-20 NOTE — Telephone Encounter (Signed)
Called and left message for patient to return call.  

## 2016-01-23 NOTE — Telephone Encounter (Signed)
Patient aware.  Mailed lab order and [facility] location for [name].  She will call and schedule after she has had labs.

## 2016-01-23 NOTE — Addendum Note (Signed)
Addended by: Denman George B on: 01/23/2016 12:27 PM   Modules accepted: Orders

## 2016-03-16 ENCOUNTER — Telehealth: Payer: Self-pay | Admitting: Family Medicine

## 2016-03-16 DIAGNOSIS — J4520 Mild intermittent asthma, uncomplicated: Secondary | ICD-10-CM

## 2016-03-16 DIAGNOSIS — J302 Other seasonal allergic rhinitis: Secondary | ICD-10-CM

## 2016-03-16 DIAGNOSIS — I10 Essential (primary) hypertension: Secondary | ICD-10-CM

## 2016-03-16 MED ORDER — ALBUTEROL SULFATE HFA 108 (90 BASE) MCG/ACT IN AERS: 2.0000 | INHALATION_SPRAY | Freq: Four times a day (QID) | RESPIRATORY_TRACT | Status: DC | PRN

## 2016-03-16 MED ORDER — FLUTICASONE PROPIONATE 50 MCG/ACT NA SUSP: 2.0000 | Freq: Every day | NASAL | Status: DC

## 2016-03-16 MED ORDER — AMLODIPINE BESYLATE 2.5 MG PO TABS
2.5000 mg | ORAL_TABLET | Freq: Every day | ORAL | Status: DC
Start: 2016-03-16 — End: 2016-05-12

## 2016-03-16 NOTE — Telephone Encounter (Signed)
Tried to schedule patient an appt since past due but wants to call back

## 2016-03-16 NOTE — Telephone Encounter (Signed)
Patient is asking for a refill on an inhaler and nasal spray and also she is asking for something for her back and leg pain, please advise and blood pressure medication refill

## 2016-04-14 ENCOUNTER — Telehealth: Payer: Self-pay | Admitting: Family Medicine

## 2016-04-14 DIAGNOSIS — M5126 Other intervertebral disc displacement, lumbar region: Secondary | ICD-10-CM

## 2016-04-14 MED ORDER — HYDROCODONE-ACETAMINOPHEN 5-325 MG PO TABS: 1.0000 | ORAL_TABLET | Freq: Four times a day (QID) | ORAL | Status: DC | PRN

## 2016-04-14 NOTE — Telephone Encounter (Signed)
Patient will try to make it to an appt soon

## 2016-04-14 NOTE — Telephone Encounter (Signed)
Patient is calling stating that she has somehow has been removed off of the [facility] program and now currently has no transportation to her Dr's appointments and had to cancel her appointment for tomorrow, please advise?

## 2016-04-15 ENCOUNTER — Ambulatory Visit: Payer: Medicare Other | Admitting: Family Medicine

## 2016-04-15 NOTE — Telephone Encounter (Signed)
Will call back and reschedule

## 2016-04-15 NOTE — Telephone Encounter (Signed)
Ms. Kaid is calling back today asking if [name] would please give her a call back

## 2016-05-07 ENCOUNTER — Other Ambulatory Visit: Payer: Self-pay

## 2016-05-07 DIAGNOSIS — M5126 Other intervertebral disc displacement, lumbar region: Secondary | ICD-10-CM

## 2016-05-07 MED ORDER — HYDROCODONE-ACETAMINOPHEN 5-325 MG PO TABS: 1.0000 | ORAL_TABLET | Freq: Four times a day (QID) | ORAL | Status: DC | PRN

## 2016-05-12 ENCOUNTER — Encounter: Payer: Self-pay | Admitting: Family Medicine

## 2016-05-12 ENCOUNTER — Ambulatory Visit (INDEPENDENT_AMBULATORY_CARE_PROVIDER_SITE_OTHER): Payer: Medicare Other | Admitting: Family Medicine

## 2016-05-12 VITALS — BP 160/90 | HR 96 | Resp 18 | Ht 64.0 in | Wt 248.0 lb

## 2016-05-12 DIAGNOSIS — H109 Unspecified conjunctivitis: Secondary | ICD-10-CM | POA: Diagnosis not present

## 2016-05-12 DIAGNOSIS — J4530 Mild persistent asthma, uncomplicated: Secondary | ICD-10-CM

## 2016-05-12 DIAGNOSIS — M25562 Pain in left knee: Secondary | ICD-10-CM

## 2016-05-12 DIAGNOSIS — M170 Bilateral primary osteoarthritis of knee: Secondary | ICD-10-CM

## 2016-05-12 DIAGNOSIS — M25561 Pain in right knee: Secondary | ICD-10-CM

## 2016-05-12 DIAGNOSIS — R7302 Impaired glucose tolerance (oral): Secondary | ICD-10-CM | POA: Diagnosis not present

## 2016-05-12 DIAGNOSIS — Z0000 Encounter for general adult medical examination without abnormal findings: Secondary | ICD-10-CM | POA: Diagnosis not present

## 2016-05-12 DIAGNOSIS — E559 Vitamin D deficiency, unspecified: Secondary | ICD-10-CM | POA: Diagnosis not present

## 2016-05-12 DIAGNOSIS — R296 Repeated falls: Secondary | ICD-10-CM

## 2016-05-12 DIAGNOSIS — F418 Other specified anxiety disorders: Secondary | ICD-10-CM

## 2016-05-12 DIAGNOSIS — R252 Cramp and spasm: Secondary | ICD-10-CM | POA: Diagnosis not present

## 2016-05-12 DIAGNOSIS — I10 Essential (primary) hypertension: Secondary | ICD-10-CM

## 2016-05-12 DIAGNOSIS — F419 Anxiety disorder, unspecified: Secondary | ICD-10-CM

## 2016-05-12 DIAGNOSIS — F329 Major depressive disorder, single episode, unspecified: Secondary | ICD-10-CM

## 2016-05-12 MED ORDER — FLUTICASONE-SALMETEROL 100-50 MCG/DOSE IN AEPB: 1.0000 | INHALATION_SPRAY | Freq: Two times a day (BID) | RESPIRATORY_TRACT | Status: DC

## 2016-05-12 MED ORDER — BUDESONIDE-FORMOTEROL FUMARATE 160-4.5 MCG/ACT IN AERO: 2.0000 | INHALATION_SPRAY | Freq: Two times a day (BID) | RESPIRATORY_TRACT | Status: DC

## 2016-05-12 MED ORDER — ASPIRIN EC 81 MG PO TBEC: 81.0000 mg | DELAYED_RELEASE_TABLET | Freq: Every day | ORAL | Status: DC

## 2016-05-12 MED ORDER — GENTAMICIN SULFATE 0.3 % OP SOLN: OPHTHALMIC | Status: DC

## 2016-05-12 MED ORDER — AMLODIPINE BESYLATE 5 MG PO TABS: 5.0000 mg | ORAL_TABLET | Freq: Every day | ORAL | Status: DC

## 2016-05-12 MED ORDER — FLUOXETINE HCL (PMDD) 10 MG PO TABS: ORAL_TABLET | ORAL | Status: DC

## 2016-05-12 NOTE — Progress Notes (Signed)
Subjective:    Patient ID: [name], female    DOB: 06-01-1934, 82 y.o.   MRN: [id number]  HPI Preventive Screening-Counseling & Management   Patient present here today for a Medicare annual wellness visit.   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 yes  Over the past two weeks, have you felt little interest or pleasure in doing things? yes  Have you lost interest or pleasure in daily life? No  Do you often feel hopeless? No  Do you cry easily over simple problems? No   Activities of Daily Living  In your present state of health, do you have any difficulty performing the following activities?  Driving?:  Yes does not drive  Managing money?: No Feeding yourself?:No Getting from bed to chair?:yes Climbing a flight of stairs?:  Yes due to impaired mobility and use of assistive device  Preparing food and eating?:No Bathing or showering?:yes at times, right arm pain Getting dressed?:yes Getting to the toilet?:No Using the toilet?:No Moving around from place to place?: Yes see above   Fall Risk Assessment In the past year have you fallen or had a near fall?: Yes x 2  Are you currently taking any medications that make you dizzy?:No   Hearing Difficulties: No Do you often ask people to speak up or repeat themselves?:No Do you experience ringing or noises in your  ears?:No Do you have difficulty understanding soft or whispered voices?:No  Cognitive Testing  Alert? Yes Normal Appearance?Yes  Oriented to person? Yes Place? Yes  Time? Yes  Displays appropriate judgment?Yes  Can read the correct time from a watch face? yes Are you having problems remembering things?No age appropriate but patient states that she is more forgetful concerning short term memory   Advanced Directives have been discussed with the patient?Yes and brochure/forms provided    List the Names of Other Physician/Practitioners you currently use: care teams updated    Indicate any recent Medical Services you may have received from other than [facility] providers in the past year (date may be approximate).   Assessment:    Annual Wellness Exam   Plan:    Patient Instructions (the written plan) was given to the patient.  Medicare Attestation  I have personally reviewed:  The patient's medical and social history  Their use of alcohol, tobacco or illicit drugs  Their current medications and supplements  The patient's functional ability including ADLs,fall risks, home safety risks, cognitive, and hearing and visual impairment  Diet and physical activities  Evidence for depression or mood disorders  The patient's weight, height, BMI, and visual acuity have been recorded in the chart. I have made referrals, counseling, and provided education to the patient based on review of the above and I have provided the patient with a written personalized care plan for preventive services.     Review of Systems  Objective:   Physical Exam  BP 160/90 mmHg  Pulse 96  Resp 18  Ht 5\' 4"  (1.626 m)  Wt 248 lb (112.492 kg)  BMI 42.55 kg/m2  SpO2 93%  HEENT: bilateral periorbital swelling and puffiness, thick drainage from right eye, bilateral conjunctival injection. No sinus tenderness, TM clear bilaterally  CVS: Heart sounds S1 and S2 , no S3 , no murmur. No JVD, no leg edema  MS:  decrease ROM spine, shoulders, hips and knees with abnormal gait    Assessment & Plan:  Medicare annual wellness visit, subsequent Annual exam as documented. Counseling done  re healthy lifestyle involving commitment to 150 minutes exercise per week, heart healthy diet, and attaining healthy weight.The importance of adequate sleep also discussed. Regular seat belt use and home safety, is also discussed. Changes in health habits are decided on by the patient with goals and time frames  set for achieving them. Immunization and cancer screening needs are specifically addressed at this visit.   HTN (hypertension), benign Uncontrolled, increase in dose of medication Nurse BP re eval in 5 to 6 weeks DASH diet and commitment to daily physical activity for a minimum of 30 minutes discussed and encouraged, as a part of hypertension management. The importance of attaining a healthy weight is also discussed.  BP/Weight 05/12/2016 09/15/2015 08/25/2015 03/19/2015 11/18/2014 10/07/2014 [id number]  Systolic BP 0000000 [id number] Q000111Q Q000111Q - 0000000 123456  Diastolic BP 90 84 84 82 - 96 82  Wt. (Lbs) 248 238 244.08 239 235 234 234.25  BMI 42.55 40.83 41.88 41 40.32 40.15 40.19        Anxiety and depression Uncontrolled, off of medication, not suicidal or homicidal, but anxious and slightly depressed Resume SSRI  Conjunctivitis 1 week h/o increased drainage and "stickiness" affecting both eyes right more than left, 5 day antibiotic course of eye drop prescribed  Multiple falls 2 falls in past 12 months, with worsening knee and hip pain. Home safety reviewed and referred to orthopedics for evaluation of her severe osteo arthirits Pt also advised  Of the need to change to more appropriate pain management for her arthritis, and need to lose weight , she continues to gain weight unfortunately  Primary osteoarthritis of both knees Worsening , with increased pain and instability, right worse than left, refer to ortho for  re eval and management, also h/o repeated falls

## 2016-05-12 NOTE — Patient Instructions (Addendum)
F/u with rectal in 4.5 month, call if you need me sooner  Labs today  Blood pressure is high, change in dose of medication please start tomorrow  New extra med for breathing, symbicort use twice daily  For depression , re start fluoxetine  Aspirin sent  You are referred to Dr Thea Alken in [place] re knee and hip pain  Drops sent in for sticky eyes  CARE with falls!  Fall Prevention in the Home  Falls can cause injuries. They can happen to people of all ages. There are many things you can do to make your home safe and to help prevent falls.  WHAT CAN I DO ON THE OUTSIDE OF MY HOME?  Regularly fix the edges of walkways and driveways and fix any cracks.  Remove anything that might make you trip as you walk through a door, such as a raised step or threshold.  Trim any bushes or trees on the path to your home.  Use bright outdoor lighting.  Clear any walking paths of anything that might make someone trip, such as rocks or tools.  Regularly check to see if handrails are loose or broken. Make sure that both sides of any steps have handrails.  Any raised decks and porches should have guardrails on the edges.  Have any leaves, snow, or ice cleared regularly.  Use sand or salt on walking paths during winter.  Clean up any spills in your garage right away. This includes oil or grease spills. WHAT CAN I DO IN THE BATHROOM?   Use night lights.  Install grab bars by the toilet and in the tub and shower. Do not use towel bars as grab bars.  Use non-skid mats or decals in the tub or shower.  If you need to sit down in the shower, use a plastic, non-slip stool.  Keep the floor dry. Clean up any water that spills on the floor as soon as it happens.  Remove soap buildup in the tub or shower regularly.  Attach bath mats securely with double-sided non-slip rug tape.  Do not have throw rugs and other things on the floor that can make you trip. WHAT CAN I DO IN THE BEDROOM?  Use  night lights.  Make sure that you have a light by your bed that is easy to reach.  Do not use any sheets or blankets that are too big for your bed. They should not hang down onto the floor.  Have a firm chair that has side arms. You can use this for support while you get dressed.  Do not have throw rugs and other things on the floor that can make you trip. WHAT CAN I DO IN THE KITCHEN?  Clean up any spills right away.  Avoid walking on wet floors.  Keep items that you use a lot in easy-to-reach places.  If you need to reach something above you, use a strong step stool that has a grab bar.  Keep electrical cords out of the way.  Do not use floor polish or wax that makes floors slippery. If you must use wax, use non-skid floor wax.  Do not have throw rugs and other things on the floor that can make you trip. WHAT CAN I DO WITH MY STAIRS?  Do not leave any items on the stairs.  Make sure that there are handrails on both sides of the stairs and use them. Fix handrails that are broken or loose. Make sure that handrails are as long  as the stairways.  Check any carpeting to make sure that it is firmly attached to the stairs. Fix any carpet that is loose or worn.  Avoid having throw rugs at the top or bottom of the stairs. If you do have throw rugs, attach them to the floor with carpet tape.  Make sure that you have a light switch at the top of the stairs and the bottom of the stairs. If you do not have them, ask someone to add them for you. WHAT ELSE CAN I DO TO HELP PREVENT FALLS?  Wear shoes that:  Do not have high heels.  Have rubber bottoms.  Are comfortable and fit you well.  Are closed at the toe. Do not wear sandals.  If you use a stepladder:  Make sure that it is fully opened. Do not climb a closed stepladder.  Make sure that both sides of the stepladder are locked into place.  Ask someone to hold it for you, if possible.  Clearly mark and make sure that you  can see:  Any grab bars or handrails.  First and last steps.  Where the edge of each step is.  Use tools that help you move around (mobility aids) if they are needed. These include:  Canes.  Walkers.  Scooters.  Crutches.  Turn on the lights when you go into a dark area. Replace any light bulbs as soon as they burn out.  Set up your furniture so you have a clear path. Avoid moving your furniture around.  If any of your floors are uneven, fix them.  If there are any pets around you, be aware of where they are.  Review your medicines with your doctor. Some medicines can make you feel dizzy. This can increase your chance of falling. Ask your doctor what other things that you can do to help prevent falls.   This information is not intended to replace advice given to you by your health care provider. Make sure you discuss any questions you have with your health care provider.   Document Released: 10/09/2009 Document Revised: 04/29/2015 Document Reviewed: 01/17/2015 Elsevier Interactive Patient Education [employer].

## 2016-05-13 LAB — COMPLETE METABOLIC PANEL WITH GFR
ALK PHOS: 64 U/L (ref 33–130)
ALT: 20 U/L (ref 6–29)
AST: 21 U/L (ref 10–35)
Albumin: 4.5 g/dL (ref 3.6–5.1)
BILIRUBIN TOTAL: 0.6 mg/dL (ref 0.2–1.2)
BUN: 13 mg/dL (ref 7–25)
CALCIUM: 9.7 mg/dL (ref 8.6–10.4)
CO2: 26 mmol/L (ref 20–31)
CREATININE: 0.71 mg/dL (ref 0.60–0.93)
Chloride: 100 mmol/L (ref 98–110)
GFR, Est Non African American: 81 mL/min (ref >=60)
Glucose, Bld: 82 mg/dL (ref 65–99)
Potassium: 4.2 mmol/L (ref 3.5–5.3)
Sodium: 140 mmol/L (ref 135–146)
TOTAL PROTEIN: 7.8 g/dL (ref 6.1–8.1)

## 2016-05-13 LAB — HEMOGLOBIN A1C
HEMOGLOBIN A1C: 6.7 % — AB (ref <5.7)
Mean Plasma Glucose: 146 mg/dL

## 2016-05-13 LAB — VITAMIN B12: Vitamin B-12: 456 pg/mL (ref 200–1100)

## 2016-05-13 LAB — VITAMIN D 25 HYDROXY (VIT D DEFICIENCY, FRACTURES): VIT D 25 HYDROXY: 17 ng/mL — AB (ref 30–100)

## 2016-05-13 LAB — MAGNESIUM: Magnesium: 2 mg/dL (ref 1.5–2.5)

## 2016-05-14 ENCOUNTER — Other Ambulatory Visit: Payer: Self-pay

## 2016-05-14 DIAGNOSIS — G479 Sleep disorder, unspecified: Secondary | ICD-10-CM

## 2016-05-14 MED ORDER — TEMAZEPAM 7.5 MG PO CAPS: 7.5000 mg | ORAL_CAPSULE | Freq: Every evening | ORAL | Status: DC | PRN

## 2016-05-14 MED ORDER — VITAMIN D (ERGOCALCIFEROL) 1.25 MG (50000 UNIT) PO CAPS: 50000.0000 [IU] | ORAL_CAPSULE | ORAL | Status: DC

## 2016-05-14 NOTE — Addendum Note (Signed)
Addended by: Denman George B on: 05/14/2016 04:53 PM   Modules accepted: Orders

## 2016-05-16 DIAGNOSIS — M170 Bilateral primary osteoarthritis of knee: Secondary | ICD-10-CM | POA: Insufficient documentation

## 2016-05-16 DIAGNOSIS — Z0000 Encounter for general adult medical examination without abnormal findings: Secondary | ICD-10-CM | POA: Insufficient documentation

## 2016-05-16 NOTE — Assessment & Plan Note (Signed)
Uncontrolled, increase in dose of medication Nurse BP re eval in 5 to 6 weeks DASH diet and commitment to daily physical activity for a minimum of 30 minutes discussed and encouraged, as a part of hypertension management. The importance of attaining a healthy weight is also discussed.  BP/Weight 05/12/2016 09/15/2015 08/25/2015 03/19/2015 11/18/2014 10/07/2014 [id number]  Systolic BP 0000000 [id number] Q000111Q Q000111Q - 0000000 123456  Diastolic BP 90 84 84 82 - 96 82  Wt. (Lbs) 248 238 244.08 239 235 234 234.25  BMI 42.55 40.83 41.88 41 40.32 40.15 40.19

## 2016-05-16 NOTE — Assessment & Plan Note (Signed)
Uncontrolled, off of medication, not suicidal or homicidal, but anxious and slightly depressed Resume SSRI

## 2016-05-16 NOTE — Assessment & Plan Note (Signed)
Worsening , with increased pain and instability, right worse than left, refer to ortho for re eval and management, also h/o repeated falls

## 2016-05-16 NOTE — Assessment & Plan Note (Signed)
1 week h/o increased drainage and "stickiness" affecting both eyes right more than left, 5 day antibiotic course of eye drop prescribed

## 2016-05-16 NOTE — Assessment & Plan Note (Signed)
2 falls in past 12 months, with worsening knee and hip pain. Home safety reviewed and referred to orthopedics for evaluation of her severe osteo arthirits Pt also advised  Of the need to change to more appropriate pain management for her arthritis, and need to lose weight , she continues to gain weight unfortunately

## 2016-05-16 NOTE — Assessment & Plan Note (Signed)

## 2016-05-20 ENCOUNTER — Other Ambulatory Visit: Payer: Self-pay

## 2016-09-09 ENCOUNTER — Telehealth: Payer: Self-pay | Admitting: Family Medicine

## 2016-09-09 NOTE — Telephone Encounter (Signed)
Left message to reschedule, [name] out of town

## 2016-09-20 ENCOUNTER — Ambulatory Visit: Payer: Medicare Other | Admitting: Family Medicine

## 2016-10-13 ENCOUNTER — Other Ambulatory Visit: Payer: Self-pay | Admitting: Family Medicine

## 2016-10-13 DIAGNOSIS — J302 Other seasonal allergic rhinitis: Secondary | ICD-10-CM

## 2016-11-29 ENCOUNTER — Ambulatory Visit: Payer: Medicare Other | Admitting: Family Medicine

## 2016-12-03 ENCOUNTER — Other Ambulatory Visit: Payer: Self-pay

## 2016-12-16 ENCOUNTER — Other Ambulatory Visit: Payer: Self-pay | Admitting: Family Medicine

## 2016-12-16 ENCOUNTER — Other Ambulatory Visit: Payer: Self-pay

## 2016-12-16 ENCOUNTER — Telehealth: Payer: Self-pay | Admitting: Family Medicine

## 2016-12-16 MED ORDER — VITAMIN D (ERGOCALCIFEROL) 1.25 MG (50000 UNIT) PO CAPS: 50000.0000 [IU] | ORAL_CAPSULE | ORAL | 5 refills | Status: DC

## 2016-12-16 NOTE — Telephone Encounter (Signed)
[name] is asking for a refill on Vitamin D, Ergocalciferol, (DRISDOL) 50000 units CAPS capsule called to [facility]

## 2016-12-16 NOTE — Telephone Encounter (Signed)
Med refilled.

## 2016-12-17 ENCOUNTER — Other Ambulatory Visit: Payer: Self-pay | Admitting: Family Medicine

## 2016-12-17 DIAGNOSIS — G479 Sleep disorder, unspecified: Secondary | ICD-10-CM

## 2017-01-04 ENCOUNTER — Telehealth: Payer: Self-pay | Admitting: Family Medicine

## 2017-01-04 ENCOUNTER — Other Ambulatory Visit: Payer: Self-pay

## 2017-01-04 DIAGNOSIS — G479 Sleep disorder, unspecified: Secondary | ICD-10-CM

## 2017-01-04 NOTE — Telephone Encounter (Signed)
Please advise if 30 day supply of both meds is authorized

## 2017-01-04 NOTE — Telephone Encounter (Signed)
[name] is stating that she needs a refill on temazepam (RESTORIL) 7.5 MG capsule, aspirin EC 81 MG tablet and aspirin EC 81 MG tablet and she knows she needs an appointment with [name] asap but having transportation problmes at this time

## 2017-01-04 NOTE — Telephone Encounter (Signed)
OK refill x 2 , let her know I look forward to seeing her soon

## 2017-01-05 MED ORDER — ASPIRIN EC 81 MG PO TBEC: 81.0000 mg | DELAYED_RELEASE_TABLET | Freq: Every day | ORAL | 2 refills | Status: DC

## 2017-01-05 MED ORDER — TEMAZEPAM 7.5 MG PO CAPS: 7.5000 mg | ORAL_CAPSULE | Freq: Every evening | ORAL | 2 refills | Status: DC | PRN

## 2017-01-05 NOTE — Telephone Encounter (Signed)
Meds refilled.

## 2017-01-07 NOTE — Telephone Encounter (Signed)
Opened in Error.

## 2017-01-10 ENCOUNTER — Other Ambulatory Visit: Payer: Self-pay | Admitting: Family Medicine

## 2017-01-10 DIAGNOSIS — I10 Essential (primary) hypertension: Secondary | ICD-10-CM

## 2017-01-13 ENCOUNTER — Ambulatory Visit: Payer: Medicare Other | Admitting: Family Medicine

## 2017-01-20 ENCOUNTER — Ambulatory Visit (INDEPENDENT_AMBULATORY_CARE_PROVIDER_SITE_OTHER): Payer: Medicare Other | Admitting: Family Medicine

## 2017-01-20 ENCOUNTER — Encounter: Payer: Self-pay | Admitting: Family Medicine

## 2017-01-20 VITALS — BP 124/82 | HR 86 | Resp 16 | Ht 64.0 in | Wt 245.0 lb

## 2017-01-20 DIAGNOSIS — E559 Vitamin D deficiency, unspecified: Secondary | ICD-10-CM

## 2017-01-20 DIAGNOSIS — M5126 Other intervertebral disc displacement, lumbar region: Secondary | ICD-10-CM

## 2017-01-20 DIAGNOSIS — Z23 Encounter for immunization: Secondary | ICD-10-CM

## 2017-01-20 DIAGNOSIS — I10 Essential (primary) hypertension: Secondary | ICD-10-CM | POA: Diagnosis not present

## 2017-01-20 DIAGNOSIS — M5430 Sciatica, unspecified side: Secondary | ICD-10-CM

## 2017-01-20 DIAGNOSIS — E782 Mixed hyperlipidemia: Secondary | ICD-10-CM | POA: Diagnosis not present

## 2017-01-20 DIAGNOSIS — G479 Sleep disorder, unspecified: Secondary | ICD-10-CM

## 2017-01-20 DIAGNOSIS — R7301 Impaired fasting glucose: Secondary | ICD-10-CM

## 2017-01-20 DIAGNOSIS — J302 Other seasonal allergic rhinitis: Secondary | ICD-10-CM

## 2017-01-20 MED ORDER — METHYLPREDNISOLONE ACETATE 80 MG/ML IJ SUSP
80.0000 mg | Freq: Once | INTRAMUSCULAR | Status: AC
  Administered 2017-01-20: 80 mg via INTRAMUSCULAR

## 2017-01-20 MED ORDER — AMLODIPINE BESYLATE 5 MG PO TABS: 5.0000 mg | ORAL_TABLET | Freq: Every day | ORAL | 4 refills | Status: DC

## 2017-01-20 MED ORDER — PREDNISONE 5 MG PO TABS: 5.0000 mg | ORAL_TABLET | Freq: Two times a day (BID) | ORAL | 0 refills | Status: AC

## 2017-01-20 MED ORDER — KETOROLAC TROMETHAMINE 60 MG/2ML IM SOLN
30.0000 mg | Freq: Once | INTRAMUSCULAR | Status: AC
  Administered 2017-01-20: 30 mg via INTRAMUSCULAR

## 2017-01-20 MED ORDER — FLUTICASONE PROPIONATE 50 MCG/ACT NA SUSP: NASAL | 4 refills | Status: DC

## 2017-01-20 NOTE — Patient Instructions (Addendum)
Wellness visit in 4 month, call if you need me before   Injections today for pain and 5 days of prednisone sent in  CBC, fasting lipid,cmp  HBA1C, TSh, Vit D in 4 month   Tylenol 325 mg one daily is good for arthritis.  Alleve one 3 times per week for pain flare  Flu vaccine today  Thank you  for choosing [facility]. We consider it a privelige to serve you.  Delivering excellent health care in a caring and  compassionate way is our goal.  Partnering with you,  so that together we can achieve this goal is our strategy.

## 2017-01-21 ENCOUNTER — Encounter: Payer: Self-pay | Admitting: Family Medicine

## 2017-01-21 NOTE — Progress Notes (Signed)
[name]     MRN: [id number]      DOB: Jan 21, 1934   HPI [name] is here for follow up and re-evaluation of chronic medical conditions, medication management and review of any available recent lab and radiology data.  Preventive health is updated, specifically   Immunization.   Questions or concerns regarding consultations or procedures which the PT has had in the interim are  Addressed.States she benefited form PT and is falling less, had to cut session short because of transportation problems. The PT denies any adverse reactions to current medications since the last visit.  C/o increased back and LLE pain , rated at a 10 and also right knee pain, wants "shots "  ROS Denies recent fever or chills. Denies sinus pressure, nasal congestion, ear pain or sore throat. Denies chest congestion, productive cough or wheezing. Denies chest pains, palpitations and leg swelling Denies abdominal pain, nausea, vomiting,diarrhea or constipation.   Denies dysuria, frequency, hesitancy or incontinence.  Denies headaches, seizures, numbness, or tingling. Denies depression or  Anxiety, her  Insomnia.is managed with medication which she is to collect Denies skin break down or rash.   PE  BP 124/82   Pulse 86   Resp 16   Ht 5\' 4"  (1.626 m)   Wt 245 lb (111.1 kg)   SpO2 93%   BMI 42.05 kg/m   Patient alert and oriented and in no cardiopulmonary distress.  HEENT: No facial asymmetry, EOMI,   oropharynx pink and moist.  Neck decreased rOM no JVD, no mass.  Chest: Clear to auscultation bilaterally.decreased though adequate air entry  CVS: S1, S2 no murmurs, no S3.Regular rate.  ABD: Soft non tender.   Ext: No edema  [id number]  Decreased  ROM lumbar spine, and right knee.  Skin: Intact, no ulcerations or rash noted.  Psych: Good eye contact, normal affect. Memory intact not anxious or depressed appearing.  CNS: CN 2-12 intact, power,  normal throughout.no focal deficits  noted.   Assessment & Plan  HTN (hypertension), benign Controlled, no change in medication DASH diet and commitment to daily physical activity for a minimum of 30 minutes discussed and encouraged, as a part of hypertension management. The importance of attaining a healthy weight is also discussed.  BP/Weight 01/20/2017 05/12/2016 09/15/2015 08/25/2015 03/19/2015 11/18/2014 [id number]  Systolic BP A999333 0000000 [id number] Q000111Q Q000111Q - 0000000  Diastolic BP 82 90 84 84 82 - 96  Wt. (Lbs) 245 248 238 244.08 239 235 234  BMI 42.05 42.55 40.83 41.88 41 40.32 40.15       SCIATICA Uncontrolled.Toradol and depo medrol administered IM in the office , to be followed by a short course of oral prednisone   Sleep disorder Sleep hygiene reviewed and written information offered also. Prescription sent for  medication needed.   Morbid obesity Unchnaged Patient re-educated about  the importance of commitment to a  minimum of 150 minutes of exercise per week.  The importance of healthy food choices with portion control discussed. Encouraged to start a food diary, count calories and to consider  joining a support group. Sample diet sheets offered. Goals set by the patient for the next several months.   Weight /BMI 01/20/2017 05/12/2016 09/15/2015  WEIGHT 245 lb 248 lb 238 lb  HEIGHT 5\' 4"  5\' 4"  5\' 4"   BMI 42.05 kg/m2 42.55 kg/m2 40.83 kg/m2      Hyperlipemia Hyperlipidemia:Low fat diet discussed and encouraged.   Lipid Panel  Lab Results  Component Value  Date   CHOL 197 08/25/2015   HDL 58 08/25/2015   LDLCALC 109 08/25/2015   TRIG 150 (H) 08/25/2015   CHOLHDL 3.4 08/25/2015   Updated lab needed      Need for influenza vaccination After obtaining informed consent, the vaccine is  administered by LPN.

## 2017-01-31 DIAGNOSIS — Z23 Encounter for immunization: Secondary | ICD-10-CM | POA: Insufficient documentation

## 2017-01-31 NOTE — Assessment & Plan Note (Signed)
Uncontrolled.Toradol and depo medrol administered IM in the office , to be followed by a short course of oral prednisone   

## 2017-01-31 NOTE — Assessment & Plan Note (Signed)
After obtaining informed consent, the vaccine is  administered by LPN.  

## 2017-01-31 NOTE — Assessment & Plan Note (Signed)
Controlled, no change in medication DASH diet and commitment to daily physical activity for a minimum of 30 minutes discussed and encouraged, as a part of hypertension management. The importance of attaining a healthy weight is also discussed.  BP/Weight 01/20/2017 05/12/2016 09/15/2015 08/25/2015 03/19/2015 11/18/2014 [id number]  Systolic BP A999333 0000000 [id number] Q000111Q Q000111Q - 0000000  Diastolic BP 82 90 84 84 82 - 96  Wt. (Lbs) 245 248 238 244.08 239 235 234  BMI 42.05 42.55 40.83 41.88 41 40.32 40.15

## 2017-01-31 NOTE — Assessment & Plan Note (Signed)
Sleep hygiene reviewed and written information offered also. Prescription sent for  medication needed.  

## 2017-01-31 NOTE — Assessment & Plan Note (Signed)
Hyperlipidemia:Low fat diet discussed and encouraged.   Lipid Panel  Lab Results  Component Value Date   CHOL 197 08/25/2015   HDL 58 08/25/2015   LDLCALC 109 08/25/2015   TRIG 150 (H) 08/25/2015   CHOLHDL 3.4 08/25/2015   Updated lab needed

## 2017-01-31 NOTE — Assessment & Plan Note (Signed)
Unchnaged Patient re-educated about  the importance of commitment to a  minimum of 150 minutes of exercise per week.  The importance of healthy food choices with portion control discussed. Encouraged to start a food diary, count calories and to consider  joining a support group. Sample diet sheets offered. Goals set by the patient for the next several months.   Weight /BMI 01/20/2017 05/12/2016 09/15/2015  WEIGHT 245 lb 248 lb 238 lb  HEIGHT 5\' 4"  5\' 4"  5\' 4"   BMI 42.05 kg/m2 42.55 kg/m2 40.83 kg/m2

## 2017-02-24 ENCOUNTER — Other Ambulatory Visit: Payer: Self-pay | Admitting: Family Medicine

## 2017-02-24 DIAGNOSIS — J4520 Mild intermittent asthma, uncomplicated: Secondary | ICD-10-CM

## 2017-03-24 ENCOUNTER — Other Ambulatory Visit: Payer: Self-pay | Admitting: Family Medicine

## 2017-03-24 ENCOUNTER — Telehealth: Payer: Self-pay

## 2017-03-24 MED ORDER — PREDNISONE 5 MG PO TABS: 5.0000 mg | ORAL_TABLET | Freq: Two times a day (BID) | ORAL | 0 refills | Status: AC

## 2017-03-24 NOTE — Telephone Encounter (Signed)
Left message that back/L hip pain was flaring up and she is requesting that something be sent in. Please advise

## 2017-03-24 NOTE — Telephone Encounter (Signed)
Five day course of prednisone sent and message left

## 2017-05-25 ENCOUNTER — Ambulatory Visit: Payer: Medicare Other

## 2017-06-01 ENCOUNTER — Ambulatory Visit (INDEPENDENT_AMBULATORY_CARE_PROVIDER_SITE_OTHER): Payer: Medicare Other

## 2017-06-01 VITALS — BP 142/84 | HR 91 | Temp 97.8°F | Ht 64.0 in | Wt 239.1 lb

## 2017-06-01 DIAGNOSIS — Z0000 Encounter for general adult medical examination without abnormal findings: Secondary | ICD-10-CM | POA: Diagnosis not present

## 2017-06-01 DIAGNOSIS — R7301 Impaired fasting glucose: Secondary | ICD-10-CM | POA: Diagnosis not present

## 2017-06-01 DIAGNOSIS — E782 Mixed hyperlipidemia: Secondary | ICD-10-CM | POA: Diagnosis not present

## 2017-06-01 DIAGNOSIS — E559 Vitamin D deficiency, unspecified: Secondary | ICD-10-CM | POA: Diagnosis not present

## 2017-06-01 DIAGNOSIS — I10 Essential (primary) hypertension: Secondary | ICD-10-CM | POA: Diagnosis not present

## 2017-06-01 NOTE — Patient Instructions (Signed)
[name] , Thank you for taking time to come for your Medicare Wellness Visit. I appreciate your ongoing commitment to your health goals. Please review the following plan we discussed and let me know if I can assist you in the future.   Screening recommendations/referrals: Colonoscopy: Up to date, no longer required Mammogram: No longer required Bone Density: Up to date Recommended yearly ophthalmology/optometry visit for glaucoma screening and checkup Recommended yearly dental visit for hygiene and checkup  Vaccinations: Influenza vaccine: Up to date, next due 07/2017 Pneumococcal vaccine: Up to date Tdap vaccine: Due, declines Shingles vaccine: Due, declines    Advanced directives: Advance directive discussed with you today. I have provided a copy for you to complete at home and have notarized. Once this is complete please bring a copy in to our office so we can scan it into your chart.  Conditions/risks identified: Obese, recommend starting a routine exercise program at least 3 days a week for 30-45 minutes at a time as tolerated.    Next appointment: Follow up with [name] in 1 week or next available opening. Follow up in 1 year for your annual wellness visit.   Preventive Care 22 Years and Older, Female Preventive care refers to lifestyle choices and visits with your health care provider that can promote health and wellness. What does preventive care include?  A yearly physical exam. This is also called an annual well check.  Dental exams once or twice a year.  Routine eye exams. Ask your health care provider how often you should have your eyes checked.  Personal lifestyle choices, including:  Daily care of your teeth and gums.  Regular physical activity.  Eating a healthy diet.  Avoiding tobacco and drug use.  Limiting alcohol use.  Practicing safe sex.  Taking low-dose aspirin every day.  Taking vitamin and mineral supplements as recommended by your health  care provider. What happens during an annual well check? The services and screenings done by your health care provider during your annual well check will depend on your age, overall health, lifestyle risk factors, and family history of disease. Counseling  Your health care provider may ask you questions about your:  Alcohol use.  Tobacco use.  Drug use.  Emotional well-being.  Home and relationship well-being.  Sexual activity.  Eating habits.  History of falls.  Memory and ability to understand (cognition).  Work and work Statistician.  Reproductive health. Screening  You may have the following tests or measurements:  Height, weight, and BMI.  Blood pressure.  Lipid and cholesterol levels. These may be checked every 5 years, or more frequently if you are over 55 years old.  Skin check.  Lung cancer screening. You may have this screening every year starting at age 1 if you have a 30-pack-year history of smoking and currently smoke or have quit within the past 15 years.  Fecal occult blood test (FOBT) of the stool. You may have this test every year starting at age 7.  Flexible sigmoidoscopy or colonoscopy. You may have a sigmoidoscopy every 5 years or a colonoscopy every 10 years starting at age 72.  Hepatitis C blood test.  Hepatitis B blood test.  Sexually transmitted disease (STD) testing.  Diabetes screening. This is done by checking your blood sugar (glucose) after you have not eaten for a while (fasting). You may have this done every 1-3 years.  Bone density scan. This is done to screen for osteoporosis. You may have this done starting at  age 72.  Mammogram. This may be done every 1-2 years. Talk to your health care provider about how often you should have regular mammograms. Talk with your health care provider about your test results, treatment options, and if necessary, the need for more tests. Vaccines  Your health care provider may recommend certain  vaccines, such as:  Influenza vaccine. This is recommended every year.  Tetanus, diphtheria, and acellular pertussis (Tdap, Td) vaccine. You may need a Td booster every 10 years.  Zoster vaccine. You may need this after age 36.  Pneumococcal 13-valent conjugate (PCV13) vaccine. One dose is recommended after age 73.  Pneumococcal polysaccharide (PPSV23) vaccine. One dose is recommended after age 30. Talk to your health care provider about which screenings and vaccines you need and how often you need them. This information is not intended to replace advice given to you by your health care provider. Make sure you discuss any questions you have with your health care provider. Document Released: 01/09/2016 Document Revised: 09/01/2016 Document Reviewed: 10/14/2015 Elsevier Interactive Patient Education  2017 [employer] Prevention in the Home Falls can cause injuries. They can happen to people of all ages. There are many things you can do to make your home safe and to help prevent falls. What can I do on the outside of my home?  Regularly fix the edges of walkways and driveways and fix any cracks.  Remove anything that might make you trip as you walk through a door, such as a raised step or threshold.  Trim any bushes or trees on the path to your home.  Use bright outdoor lighting.  Clear any walking paths of anything that might make someone trip, such as rocks or tools.  Regularly check to see if handrails are loose or broken. Make sure that both sides of any steps have handrails.  Any raised decks and porches should have guardrails on the edges.  Have any leaves, snow, or ice cleared regularly.  Use sand or salt on walking paths during winter.  Clean up any spills in your garage right away. This includes oil or grease spills. What can I do in the bathroom?  Use night lights.  Install grab bars by the toilet and in the tub and shower. Do not use towel bars as grab  bars.  Use non-skid mats or decals in the tub or shower.  If you need to sit down in the shower, use a plastic, non-slip stool.  Keep the floor dry. Clean up any water that spills on the floor as soon as it happens.  Remove soap buildup in the tub or shower regularly.  Attach bath mats securely with double-sided non-slip rug tape.  Do not have throw rugs and other things on the floor that can make you trip. What can I do in the bedroom?  Use night lights.  Make sure that you have a light by your bed that is easy to reach.  Do not use any sheets or blankets that are too big for your bed. They should not hang down onto the floor.  Have a firm chair that has side arms. You can use this for support while you get dressed.  Do not have throw rugs and other things on the floor that can make you trip. What can I do in the kitchen?  Clean up any spills right away.  Avoid walking on wet floors.  Keep items that you use a lot in easy-to-reach places.  If you  need to reach something above you, use a strong step stool that has a grab bar.  Keep electrical cords out of the way.  Do not use floor polish or wax that makes floors slippery. If you must use wax, use non-skid floor wax.  Do not have throw rugs and other things on the floor that can make you trip. What can I do with my stairs?  Do not leave any items on the stairs.  Make sure that there are handrails on both sides of the stairs and use them. Fix handrails that are broken or loose. Make sure that handrails are as long as the stairways.  Check any carpeting to make sure that it is firmly attached to the stairs. Fix any carpet that is loose or worn.  Avoid having throw rugs at the top or bottom of the stairs. If you do have throw rugs, attach them to the floor with carpet tape.  Make sure that you have a light switch at the top of the stairs and the bottom of the stairs. If you do not have them, ask someone to add them for  you. What else can I do to help prevent falls?  Wear shoes that:  Do not have high heels.  Have rubber bottoms.  Are comfortable and fit you well.  Are closed at the toe. Do not wear sandals.  If you use a stepladder:  Make sure that it is fully opened. Do not climb a closed stepladder.  Make sure that both sides of the stepladder are locked into place.  Ask someone to hold it for you, if possible.  Clearly mark and make sure that you can see:  Any grab bars or handrails.  First and last steps.  Where the edge of each step is.  Use tools that help you move around (mobility aids) if they are needed. These include:  Canes.  Walkers.  Scooters.  Crutches.  Turn on the lights when you go into a dark area. Replace any light bulbs as soon as they burn out.  Set up your furniture so you have a clear path. Avoid moving your furniture around.  If any of your floors are uneven, fix them.  If there are any pets around you, be aware of where they are.  Review your medicines with your doctor. Some medicines can make you feel dizzy. This can increase your chance of falling. Ask your doctor what other things that you can do to help prevent falls. This information is not intended to replace advice given to you by your health care provider. Make sure you discuss any questions you have with your health care provider. Document Released: 10/09/2009 Document Revised: 05/20/2016 Document Reviewed: 01/17/2015 Elsevier Interactive Patient Education  2017 [employer].

## 2017-06-01 NOTE — Progress Notes (Signed)
Subjective:   [name] is a 83 y.o. female who presents for Medicare Annual (Subsequent) preventive examination.  Review of Systems:  Cardiac Risk Factors include: advanced age (>26men, >35 women);hypertension;sedentary lifestyle;obesity (BMI >30kg/m2);dyslipidemia     Objective:     Vitals: BP (!) 142/84   Pulse 91   Temp 97.8 F (36.6 C) (Oral)   Ht 5\' 4"  (1.626 m)   Wt 239 lb 1.9 oz (108.5 kg)   SpO2 90%   BMI 41.04 kg/m   Body mass index is 41.04 kg/m.   Tobacco History  Smoking Status  . Former Smoker  . Packs/day: 0.50  . Years: 30.00  . Types: Cigarettes  . Quit date: 12/27/1993  Smokeless Tobacco  . Never Used     Counseling given: Not Answered   Past Medical History:  Diagnosis Date  . Asthma   . Back pain   . Depression   . Family history of arthritis   . Family history of diabetes mellitus   . Family history of ischemic heart disease   . Headache(784.0)   . High blood pressure   . High cholesterol   . Hyperlipidemia   . Hypertension   . Obesity   . Pain from implanted hardware 01/28/2012  . Personal history of unspecified circulatory disease   . Sciatica   . Shortness of breath   . Urinary incontinence    Past Surgical History:  Procedure Laterality Date  . ABDOMINAL HYSTERECTOMY    . BACK SURGERY  2009   Dr. Ronnald [facility]  . COLONOSCOPY    . HARDWARE REMOVAL  01/28/2012   Procedure: HARDWARE REMOVAL;  Surgeon: Johnny Bridge, MD;  Location: [facility];  Service: Orthopedics;  Laterality: Right;  Right Knee  . right knee surgery after fracture  2006  . SPINE SURGERY     Family History  Problem Relation Age of Onset  . Breast cancer Mother        family history   . Hypertension Mother   . Diabetes Mother   . Hypertension Father   . Stroke Father   . Hypertension Sister   . Hypertension Brother   . Hypertension Brother   . Hypertension Brother   . Diabetes Sister   . Stroke Brother   . Heart  defect Unknown        family history   . Colon cancer Neg Hx   . Colon polyps Neg Hx   . Esophageal cancer Neg Hx    History  Sexual Activity  . Sexual activity: Not Currently    Outpatient Encounter Prescriptions as of 06/01/2017  Medication Sig  . aspirin EC 81 MG tablet Take 1 tablet (81 mg total) by mouth daily.  . fluticasone (FLONASE) 50 MCG/ACT nasal spray instill 2 sprays into each nostril once daily (Patient taking differently: Place 2 sprays into both nostrils daily as needed. instill 2 sprays into each nostril once daily)  . Ibuprofen (ADVIL) 200 MG CAPS Take 2 capsules by mouth daily as needed.  Marland Kitchen PROAIR HFA 108 (90 Base) MCG/ACT inhaler inhale 2 puffs every 6 hours if needed for wheezing  . temazepam (RESTORIL) 7.5 MG capsule Take 1 capsule (7.5 mg total) by mouth at bedtime as needed for sleep.  . Vitamin D, Ergocalciferol, (DRISDOL) 50000 units CAPS capsule Take 1 capsule (50,000 Units total) by mouth every 7 (seven) days.  Marland Kitchen amLODipine (NORVASC) 5 MG tablet Take 1 tablet (5 mg total) by mouth  daily. (Patient not taking: Reported on 06/01/2017)   No facility-administered encounter medications on file as of 06/01/2017.     Activities of Daily Living In your present state of health, do you have any difficulty performing the following activities: 06/01/2017  Hearing? N  Vision? Y  Difficulty concentrating or making decisions? N  Walking or climbing stairs? Y  Dressing or bathing? N  Doing errands, shopping? Y  Preparing Food and eating ? Y  Using the Toilet? N  In the past six months, have you accidently leaked urine? Y  Do you have problems with loss of bowel control? N  Managing your Medications? N  Managing your Finances? N  Housekeeping or managing your Housekeeping? Y  Some recent data might be hidden    Patient Care Team: Fayrene Helper, MD as PCP - General (Family Medicine)    Assessment:    Exercise Activities and Dietary recommendations Current  Exercise Habits: The patient does not participate in regular exercise at present, Exercise limited by: orthopedic condition(s)  Goals    . Exercise 3x per week (30 min per time)          Recommend starting a routine exercise program at least 3 days a week for 30-45 minutes at a time as tolerated.        Fall Risk Fall Risk  06/01/2017 12/03/2016 08/25/2015 05/13/2014 08/07/2013  Falls in the past year? No No No Yes Yes  Number falls in past yr: - - - 2 or more 1  Injury with Fall? - - - - Yes  Risk for fall due to : Impaired balance/gait;Impaired mobility - - History of fall(s);Impaired mobility -   Depression Screen PHQ 2/9 Scores 06/01/2017 05/12/2016 08/25/2015 05/13/2014  PHQ - 2 Score 6 4 6 6   PHQ- 9 Score 10 9 13 18      Cognitive Function: Normal  6CIT Screen 06/01/2017  What Year? 0 points  What month? 0 points  What time? 0 points  Count back from 20 0 points  Months in reverse 0 points  Repeat phrase 0 points  Total Score 0    Immunization History  Administered Date(s) Administered  . H1N1 12/10/2008  . Influenza Whole 12/04/2007, 09/07/2010  . Influenza,inj,Quad PF,36+ Mos 10/18/2014, 08/25/2015, 01/20/2017  . Pneumococcal Conjugate-13 03/19/2015  . Pneumococcal Polysaccharide-23 05/04/2005  . Td 05/04/2005   Screening Tests Health Maintenance  Topic Date Due  . TETANUS/TDAP  06/14/2019 (Originally 05/05/2015)  . INFLUENZA VACCINE  07/27/2017  . DEXA SCAN  Completed  . PNA vac Low Risk Adult  Completed      Plan:   I have personally reviewed and noted the following in the patient's chart:   . Medical and social history . Use of alcohol, tobacco or illicit drugs  . Current medications and supplements . Functional ability and status . Nutritional status . Physical activity . Advanced directives . List of other physicians . Hospitalizations, surgeries, and ER visits in previous 12 months . Vitals . Screenings to include cognitive, depression, and  falls . Referrals and appointments  In addition, I have reviewed and discussed with patient certain preventive protocols, quality metrics, and best practice recommendations. A written personalized care plan for preventive services as well as general preventive health recommendations were provided to patient.     Stormy Fabian, LPN  01/03/16

## 2017-06-02 LAB — LIPID PANEL
CHOL/HDL RATIO: 3.3 ratio (ref <5.0)
Cholesterol: 217 mg/dL — ABNORMAL HIGH (ref <200)
HDL: 66 mg/dL (ref >50)
LDL Cholesterol: 120 mg/dL — ABNORMAL HIGH (ref <100)
Triglycerides: 155 mg/dL — ABNORMAL HIGH (ref <150)
VLDL: 31 mg/dL — ABNORMAL HIGH (ref <30)

## 2017-06-02 LAB — COMPREHENSIVE METABOLIC PANEL
ALK PHOS: 74 U/L (ref 33–130)
ALT: 21 U/L (ref 6–29)
AST: 20 U/L (ref 10–35)
Albumin: 4.3 g/dL (ref 3.6–5.1)
BUN: 13 mg/dL (ref 7–25)
CALCIUM: 10.1 mg/dL (ref 8.6–10.4)
CO2: 27 mmol/L (ref 20–31)
Chloride: 105 mmol/L (ref 98–110)
Creat: 0.82 mg/dL (ref 0.60–0.88)
GLUCOSE: 91 mg/dL (ref 65–99)
Potassium: 4.8 mmol/L (ref 3.5–5.3)
Sodium: 144 mmol/L (ref 135–146)
Total Bilirubin: 0.5 mg/dL (ref 0.2–1.2)
Total Protein: 7.9 g/dL (ref 6.1–8.1)

## 2017-06-02 LAB — CBC
HEMATOCRIT: 45 % (ref 35.0–45.0)
Hemoglobin: 14 g/dL (ref 11.7–15.5)
MCH: 26.3 pg — ABNORMAL LOW (ref 27.0–33.0)
MCHC: 31.1 g/dL — ABNORMAL LOW (ref 32.0–36.0)
MCV: 84.6 fL (ref 80.0–100.0)
MPV: 10.3 fL (ref 7.5–12.5)
PLATELETS: 411 10*3/uL — AB (ref 140–400)
RBC: 5.32 MIL/uL — AB (ref 3.80–5.10)
RDW: 15.5 % — AB (ref 11.0–15.0)
WBC: 10.9 10*3/uL — AB (ref 3.8–10.8)

## 2017-06-02 LAB — VITAMIN D 25 HYDROXY (VIT D DEFICIENCY, FRACTURES): Vit D, 25-Hydroxy: 23 ng/mL — ABNORMAL LOW (ref 30–100)

## 2017-06-02 LAB — HEMOGLOBIN A1C
Hgb A1c MFr Bld: 6.2 % — ABNORMAL HIGH (ref <5.7)
Mean Plasma Glucose: 131 mg/dL

## 2017-06-02 LAB — TSH: TSH: 1.56 mIU/L

## 2017-06-08 ENCOUNTER — Encounter: Payer: Self-pay | Admitting: Family Medicine

## 2017-06-08 ENCOUNTER — Ambulatory Visit: Payer: Medicare Other

## 2017-06-08 ENCOUNTER — Ambulatory Visit (INDEPENDENT_AMBULATORY_CARE_PROVIDER_SITE_OTHER): Payer: Medicare Other | Admitting: Family Medicine

## 2017-06-08 VITALS — BP 140/82 | HR 88 | Resp 16 | Ht 64.0 in | Wt 249.0 lb

## 2017-06-08 DIAGNOSIS — Z1239 Encounter for other screening for malignant neoplasm of breast: Secondary | ICD-10-CM

## 2017-06-08 DIAGNOSIS — F32A Depression, unspecified: Secondary | ICD-10-CM

## 2017-06-08 DIAGNOSIS — I10 Essential (primary) hypertension: Secondary | ICD-10-CM | POA: Diagnosis not present

## 2017-06-08 DIAGNOSIS — M150 Primary generalized (osteo)arthritis: Secondary | ICD-10-CM

## 2017-06-08 DIAGNOSIS — E784 Other hyperlipidemia: Secondary | ICD-10-CM

## 2017-06-08 DIAGNOSIS — Z1231 Encounter for screening mammogram for malignant neoplasm of breast: Secondary | ICD-10-CM

## 2017-06-08 DIAGNOSIS — E7849 Other hyperlipidemia: Secondary | ICD-10-CM

## 2017-06-08 DIAGNOSIS — F329 Major depressive disorder, single episode, unspecified: Secondary | ICD-10-CM

## 2017-06-08 DIAGNOSIS — J4520 Mild intermittent asthma, uncomplicated: Secondary | ICD-10-CM

## 2017-06-08 DIAGNOSIS — M159 Polyosteoarthritis, unspecified: Secondary | ICD-10-CM

## 2017-06-08 DIAGNOSIS — R7302 Impaired glucose tolerance (oral): Secondary | ICD-10-CM

## 2017-06-08 DIAGNOSIS — F419 Anxiety disorder, unspecified: Secondary | ICD-10-CM

## 2017-06-08 DIAGNOSIS — R296 Repeated falls: Secondary | ICD-10-CM

## 2017-06-08 MED ORDER — PRAVASTATIN SODIUM 10 MG PO TABS: 10.0000 mg | ORAL_TABLET | Freq: Every day | ORAL | 4 refills | Status: DC

## 2017-06-08 NOTE — Patient Instructions (Signed)
All the best .,it has been a pleasure caring for you I will reach out to dr Baird Cancer to take you on as a new patient and will contact your daughter Nehemiah Settle, once she confirms  New is pravachol 10 mg one at bedtime  Reduce fried and fatty foods   Please get mammogram scheduled  I am referring you to physical therapy at [facility] twice weekly for 4 weeks to help with balance and mobility  Please eat 3 sma;ll meal daily so that you lose weigth and feel healthier  Thank you  for choosing [facility]. We consider it a privelige to serve you.  Delivering excellent health care in a caring and  compassionate way is our goal.  Partnering with you,  so that together we can achieve this goal is our strategy.

## 2017-06-09 ENCOUNTER — Telehealth: Payer: Self-pay | Admitting: Family Medicine

## 2017-06-09 NOTE — Progress Notes (Signed)
[name]     MRN: [id number]      DOB: [birth year]   HPI [name] is here for follow up and re-evaluation of chronic medical conditions, medication management and review of any available recent lab and radiology data.  Preventive health is updated, specifically  Cancer screening and Immunization.  Lost [name] in February of this year and still trying  To get over this though from all accounts [name] was ill   The PT denies any adverse reactions to current medications since the last visit.   States she has a lot of imbalance and feels tired easily when attempting to walks, requests PT , which she undoubtedly would benefit from. Also will benefit from weight loss as she has continued to ain an excessive amount of weight most of which is in the waist   Request having Kendra care sifted to [facility], which I have advised for the past over 3 years that she has relocated tr and with which I fully agree, will attempt to facilitate if possible  ROS Denies recent fever or chills. Denies sinus pressure,c/o asal congestion, ear pain or sore throat. Denies chest congestion, productive cough or wheezing. Denies chest pains, palpitations and leg swelling Denies abdominal pain, nausea, vomiting,diarrhea or constipation.   Denies dysuria, frequency, hesitancy or incontinence. C/o  joint pain, swelling and limitation in mobility. C/o lower extremity numbness and tingling. Denies depression, anxiety or insomnia. Denies skin break down or rash.   PE  BP 140/82   Pulse 88   Resp 16   Ht 5\' 4"  (1.626 m)   Wt 249 lb (112.9 kg)   SpO2 90%   BMI 42.74 kg/m   Patient alert and oriented and in no cardiopulmonary distress.  HEENT: No facial asymmetry, EOMI,   oropharynx pink and moist.  Neck adequate ROM  no JVD, no mass.  Chest: Clear to auscultation bilaterally.  CVS: S1, S2 no murmurs, no S3.Regular rate.  ABD: Soft , , marked truncal obesity, non tender.   Ext: No  edema  MS: Decreased  ROM spine, shoulders, hips and knees.  Skin: Intact, no ulcerations or rash noted.  Psych: Good eye contact, normal affect.  not anxious or depressed appearing.  CNS: CN 2-12 intact, power,  normal throughout.no focal deficits noted.   Assessment & Plan  HTN (hypertension), benign Adequate control, no change in medication DASH diet and commitment to daily physical activity for a minimum of 30 minutes discussed and encouraged, as a part of hypertension management. The importance of attaining a healthy weight is also discussed.  BP/Weight 06/08/2017 06/01/2017 01/20/2017 05/12/2016 09/15/2015 08/25/2015 6/81/2751  Systolic BP 700 174 944 967 591 638 466  Diastolic BP 82 84 82 90 84 84 82  Wt. (Lbs) 249 239.12 245 248 238 244.08 239  BMI 42.74 41.04 42.05 42.55 40.83 41.88 41       Hyperlipemia Hyperlipidemia:Low fat diet discussed and encouraged.   Lipid Panel  Lab Results  Component Value Date   CHOL 217 (H) 06/01/2017   HDL 66 06/01/2017   LDLCALC 120 (H) 06/01/2017   TRIG 155 (H) 06/01/2017   CHOLHDL 3.3 06/01/2017     Start low dose statin to reduce CV risk  Anxiety and depression Controlled , improved and no longer on medication, has been on zoloft in the past  Asthma, chronic Stable and controled off of maintenance therapy currently  IGT (impaired glucose tolerance) Patient educated about the importance of  limiting  Carbohydrate intake , the need to commit to daily physical activity for a minimum of 30 minutes , and to commit weight loss. The fact that changes in all these areas will reduce or eliminate all together the development of diabetes is stressed.  Improved, pt congratulate on this  Diabetic Labs Latest Ref Rng & Units 06/01/2017 05/12/2016 08/25/2015 11/18/2014 05/13/2014  HbA1c <5.7 % 6.2(H) 6.7(H) 6.4(H) 6.3(H) 6.6(H)  Chol <200 mg/dL 217(H) - 197 - 209(H)  HDL >50 mg/dL 66 - 58 - 80  Calc LDL <100 mg/dL 120(H) - 109 - 100(H)    Triglycerides <150 mg/dL 155(H) - 150(H) - 143  Creatinine 0.60 - 0.88 mg/dL 0.82 0.71 0.69 0.58 0.72   BP/Weight 06/08/2017 06/01/2017 01/20/2017 05/12/2016 09/15/2015 08/25/2015 9/75/3005  Systolic BP 110 211 173 567 014 103 013  Diastolic BP 82 84 82 90 84 84 82  Wt. (Lbs) 249 239.12 245 248 238 244.08 239  BMI 42.74 41.04 42.05 42.55 40.83 41.88 41   No flowsheet data found.    Morbid obesity Deteriorated. Patient re-educated about  the importance of commitment to a  minimum of 150 minutes of exercise per week.  The importance of healthy food choices with portion control discussed. Encouraged to start a food diary, count calories and to consider  joining a support group. Sample diet sheets offered. Goals set by the patient for the next several months.   Weight /BMI 06/08/2017 06/01/2017 01/20/2017  WEIGHT 249 lb 239 lb 1.9 oz 245 lb  HEIGHT 5\' 4"  5\' 4"  5\' 4"   BMI 42.74 kg/m2 41.04 kg/m2 42.05 kg/m2      Multiple falls unsteady gait with h/o multiple falls , refer out PT Physical therapy, at [facility], twice weekly x 4 weeks

## 2017-06-09 NOTE — Assessment & Plan Note (Signed)
Controlled , improved and no longer on medication, has been on zoloft in the past

## 2017-06-09 NOTE — Assessment & Plan Note (Signed)
Stable and controled off of maintenance therapy currently

## 2017-06-09 NOTE — Assessment & Plan Note (Signed)
Deteriorated. Patient re-educated about  the importance of commitment to a  minimum of 150 minutes of exercise per week.  The importance of healthy food choices with portion control discussed. Encouraged to start a food diary, count calories and to consider  joining a support group. Sample diet sheets offered. Goals set by the patient for the next several months.   Weight /BMI 06/08/2017 06/01/2017 01/20/2017  WEIGHT 249 lb 239 lb 1.9 oz 245 lb  HEIGHT 5\' 4"  5\' 4"  5\' 4"   BMI 42.74 kg/m2 41.04 kg/m2 42.05 kg/m2

## 2017-06-09 NOTE — Telephone Encounter (Signed)
Left voicemail for patient coordinator at   [employer]. Baird Cancer, MD Internal Medicine  [phone] To schedule an EST care appt for patient per Dr.Simpson. Patient is moving to that area.

## 2017-06-09 NOTE — Assessment & Plan Note (Signed)
Adequate control, no change in medication DASH diet and commitment to daily physical activity for a minimum of 30 minutes discussed and encouraged, as a part of hypertension management. The importance of attaining a healthy weight is also discussed.  BP/Weight 06/08/2017 06/01/2017 01/20/2017 05/12/2016 09/15/2015 08/25/2015 5/68/1275  Systolic BP 170 017 494 496 759 163 846  Diastolic BP 82 84 82 90 84 84 82  Wt. (Lbs) 249 239.12 245 248 238 244.08 239  BMI 42.74 41.04 42.05 42.55 40.83 41.88 41

## 2017-06-09 NOTE — Assessment & Plan Note (Signed)
Hyperlipidemia:Low fat diet discussed and encouraged.   Lipid Panel  Lab Results  Component Value Date   CHOL 217 (H) 06/01/2017   HDL 66 06/01/2017   LDLCALC 120 (H) 06/01/2017   TRIG 155 (H) 06/01/2017   CHOLHDL 3.3 06/01/2017     Start low dose statin to reduce CV risk

## 2017-06-09 NOTE — Assessment & Plan Note (Signed)
unsteady gait with h/o multiple falls , refer out PT Physical therapy, at [facility], twice weekly x 4 weeks

## 2017-06-09 NOTE — Assessment & Plan Note (Signed)
Patient educated about the importance of limiting  Carbohydrate intake , the need to commit to daily physical activity for a minimum of 30 minutes , and to commit weight loss. The fact that changes in all these areas will reduce or eliminate all together the development of diabetes is stressed.  Improved, pt congratulate on this  Diabetic Labs Latest Ref Rng & Units 06/01/2017 05/12/2016 08/25/2015 11/18/2014 05/13/2014  HbA1c <5.7 % 6.2(H) 6.7(H) 6.4(H) 6.3(H) 6.6(H)  Chol <200 mg/dL 217(H) - 197 - 209(H)  HDL >50 mg/dL 66 - 58 - 80  Calc LDL <100 mg/dL 120(H) - 109 - 100(H)  Triglycerides <150 mg/dL 155(H) - 150(H) - 143  Creatinine 0.60 - 0.88 mg/dL 0.82 0.71 0.69 0.58 0.72   BP/Weight 06/08/2017 06/01/2017 01/20/2017 05/12/2016 09/15/2015 08/25/2015 0/62/6948  Systolic BP 546 270 350 093 818 299 371  Diastolic BP 82 84 82 90 84 84 82  Wt. (Lbs) 249 239.12 245 248 238 244.08 239  BMI 42.74 41.04 42.05 42.55 40.83 41.88 41   No flowsheet data found.

## 2017-06-20 ENCOUNTER — Ambulatory Visit
Admission: RE | Admit: 2017-06-20 | Discharge: 2017-06-20 | Disposition: A | Discharge status: Home / Self Care | Payer: Medicare Other | Source: Ambulatory Visit | Attending: Family Medicine | Admitting: Family Medicine

## 2017-06-20 DIAGNOSIS — Z1239 Encounter for other screening for malignant neoplasm of breast: Secondary | ICD-10-CM

## 2017-06-20 DIAGNOSIS — Z1231 Encounter for screening mammogram for malignant neoplasm of breast: Secondary | ICD-10-CM | POA: Diagnosis not present

## 2017-06-21 ENCOUNTER — Other Ambulatory Visit: Payer: Self-pay | Admitting: Family Medicine

## 2017-06-21 DIAGNOSIS — R928 Other abnormal and inconclusive findings on diagnostic imaging of breast: Secondary | ICD-10-CM

## 2017-06-24 ENCOUNTER — Ambulatory Visit
Admission: RE | Admit: 2017-06-24 | Discharge: 2017-06-24 | Disposition: A | Discharge status: Home / Self Care | Payer: Medicare Other | Source: Ambulatory Visit | Attending: Family Medicine | Admitting: Family Medicine

## 2017-06-24 DIAGNOSIS — R928 Other abnormal and inconclusive findings on diagnostic imaging of breast: Secondary | ICD-10-CM

## 2017-06-24 DIAGNOSIS — N6310 Unspecified lump in the right breast, unspecified quadrant: Secondary | ICD-10-CM | POA: Diagnosis not present

## 2017-06-27 ENCOUNTER — Ambulatory Visit: Payer: Medicare Other | Attending: Family Medicine | Admitting: Physical Therapy

## 2017-06-27 ENCOUNTER — Encounter: Payer: Self-pay | Admitting: Physical Therapy

## 2017-06-27 DIAGNOSIS — R2681 Unsteadiness on feet: Secondary | ICD-10-CM | POA: Diagnosis not present

## 2017-06-27 DIAGNOSIS — M6281 Muscle weakness (generalized): Secondary | ICD-10-CM | POA: Diagnosis not present

## 2017-06-27 DIAGNOSIS — Z9181 History of falling: Secondary | ICD-10-CM

## 2017-06-27 DIAGNOSIS — R2689 Other abnormalities of gait and mobility: Secondary | ICD-10-CM | POA: Diagnosis not present

## 2017-06-27 NOTE — Therapy (Signed)
[facility] [address] [place], [place], [address] Phone: [phone]   Fax:  [phone]  Physical Therapy Evaluation  Patient Details  Name: [name] MRN: [id number] Date of Birth: August 18, 1934 Referring Provider: Fayrene Helper MD   Encounter Date: 06/27/2017      PT End of Session - 06/27/17 1309    Visit Number 1   Number of Visits 17   Date for PT Re-Evaluation 08/26/17   Authorization Type Medicare & G-codes every 10th visit    PT Start Time 1015   PT Stop Time 1100   PT Time Calculation (min) 45 min   Equipment Utilized During Treatment Gait belt   Activity Tolerance Patient tolerated treatment well   Behavior During Therapy [facility] for tasks assessed/performed      Past Medical History:  Diagnosis Date  . Asthma   . Back pain   . Depression   . Family history of arthritis   . Family history of diabetes mellitus   . Family history of ischemic heart disease   . Headache(784.0)   . High blood pressure   . High cholesterol   . Hyperlipidemia   . Hypertension   . Obesity   . Pain from implanted hardware 01/28/2012  . Personal history of unspecified circulatory disease   . Sciatica   . Shortness of breath   . Urinary incontinence     Past Surgical History:  Procedure Laterality Date  . ABDOMINAL HYSTERECTOMY    . BACK SURGERY  2009   Dr. Ronnald Ramp [place] [facility]  . COLONOSCOPY    . HARDWARE REMOVAL  01/28/2012   Procedure: HARDWARE REMOVAL;  Surgeon: Johnny Bridge, MD;  Location: [facility];  Service: Orthopedics;  Laterality: Right;  Right Knee  . right knee surgery after fracture  2006  . SPINE SURGERY      There were no vitals filed for this visit.       Subjective Assessment - 06/27/17 1016    Subjective Patient is an 83 year old female presenting to OPPT due to multiple falls and imbalance. Patient reports she has intermittent pain related to her arthritis in  her back, B hips, and B knees with her R knee being more painful than L knee. Patient reports that her asthma is triggered by weather and temperature changes. Patient ambulates into PT with a cane, which she reports she is utilizing when she is outside of her house. PT reports she does not utilize an assistive device when ambulating in her home. Patient reports that she feels most unsteady when she is walking with her cane, which is when she reports many near falls.      Patient is accompained by: Family member  daughter and granddaughter    Pertinent History HTN, hyperlipemia, anxiety and depression, asthma, obesity, sciatica, urinary incontinence, shortness of breath, osteoarthritis, low back surgery (8 years ago)       Limitations Lifting;Standing;Walking;House hold activities   How long can you sit comfortably? 1-2 hours    Patient Stated Goals walk again; be able to go the Y, church, and shopping    Currently in Pain? No/denies  when she does have pain, it is often in R knee             [facility] PT Assessment - 06/27/17 1015      Assessment   Medical Diagnosis Multiple falls    Referring Provider Fayrene Helper MD  Onset Date/Surgical Date 06/09/17  PT referral      Precautions   Precautions Fall     Balance Screen   Has the patient fallen in the past 6 months No   Has the patient had a decrease in activity level because of a fear of falling?  Yes   Is the patient reluctant to leave their home because of a fear of falling?  No     Home Environment   Living Environment Private residence   Living Arrangements Other relatives  granddaughter; daughter lives nearby    Type of [place] to enter   Entrance Stairs-Number of Steps 6   Entrance Stairs-Rails Right;Left;Can reach both   [place] One level   [place] - 4 wheels;Cane - single point;Shower seat;Grab bars - tub/shower  rollator without a seat    Additional Comments Patient  reports she sleeps in recliner.      Prior Function   Level of Independence Independent with household mobility without device;Independent with community mobility with device   Leisure hangout with grandchildren; watching TV; going out to eat      Observation/Other Assessments   Focus on Therapeutic Outcomes (FOTO)  40.51 Functional Status     ROM / Strength   AROM / PROM / Strength Strength     Strength   Strength Assessment Site Hip;Knee;Ankle   Right/Left Hip Right;Left   Right Hip Flexion 4/5   Left Hip Flexion 4/5   Right/Left Knee Right;Left   Right Knee Flexion 4+/5   Right Knee Extension 5/5   Left Knee Flexion 4+/5   Left Knee Extension 5/5   Right/Left Ankle Right;Left   Right Ankle Dorsiflexion 5/5   Left Ankle Dorsiflexion 4+/5     Transfers   Transfers Sit to Stand;Stand to Sit   Sit to Stand 5: Supervision;With upper extremity assist;With armrests;From chair/3-in-1   Stand to Sit 5: Supervision;With upper extremity assist;With armrests;To chair/3-in-1  with BLEs against chair      Ambulation/Gait   Ambulation/Gait Yes   Ambulation/Gait Assistance 4: Min guard   Ambulation Distance (Feet) 45 Feet   Assistive device Straight cane   Gait Pattern Step-to pattern;Decreased arm swing - left;Decreased step length - left;Decreased stance time - right;Decreased stride length;Decreased weight shift to right   Ambulation Surface Level;Indoor   Gait velocity 1.37ft/s with SPC; 2.54ft/s without SPC      Standardized Balance Assessment   Standardized Balance Assessment Berg Balance Test;Timed Up and Go Test;10 meter walk test   10 Meter Walk 1.72ft/s with SPC; 2.58ft/s without SPC     Berg Balance Test   Sit to Stand Able to stand  independently using hands   Standing Unsupported Able to stand 2 minutes with supervision   Sitting with Back Unsupported but Feet Supported on Floor or Stool Able to sit safely and securely 2 minutes   Stand to Sit Uses backs of legs  against chair to control descent   Transfers Able to transfer safely, definite need of hands   Standing Unsupported with Eyes Closed Able to stand 10 seconds with supervision   Standing Ubsupported with Feet Together Needs help to attain position and unable to hold for 15 seconds   From Standing, Reach Forward with Outstretched Arm Can reach forward >5 cm safely (2")   From Standing Position, Pick up Object from Floor Able to pick up shoe, needs supervision   From Standing Position, Turn to Look Behind  Over each Shoulder Turn sideways only but maintains balance   Turn 360 Degrees Needs assistance while turning   Standing Unsupported, Alternately Place Feet on Step/Stool Needs assistance to keep from falling or unable to try   Standing Unsupported, One Foot in [place] to take small step independently and hold 30 seconds   Standing on One Leg Unable to try or needs assist to prevent fall   Total Score 27   Berg comment: Patient required 3 seated rests to complete Berg & dyspnea 3/4     Timed Up and Go Test   Normal TUG (seconds) 19.26  19.26s with SPC; 17.79s without SPC       Vital Signs:  [place] [employer] test (during seated rest break): SpO2=91%-95%; HR =81bpm  Following 21m Walk Test: SpO2 = 92%; HR = 110bpm  Following TUG test: SpO2 = 95%; HR =100bpm          Objective measurements completed on examination: See above findings.                  PT Education - 06/27/17 1058    Education provided Yes   Education Details plan of care; SCAT application information due to transportation issues to make appointments    Person(s) Educated Patient;Child(ren)   Methods Explanation   Comprehension Verbalized understanding          PT Short Term Goals - 06/27/17 1328      PT SHORT TERM GOAL #1   Title Patient will verbalize understanding and return demonstration for initial HEP to increase LE strength and balance to decrease risk of falling. (TARGET  DATE: 07/29/2017)    Time 4   Period Weeks   Status New     PT SHORT TERM GOAL #2   Title Patient's gait velocity will be >/= 2.45ft/s with LRAD to indicate a decrease in risk of falling. (TARGET DATE: 07/29/2017)    Time 4   Period Weeks   Status New     PT SHORT TERM GOAL #3   Title Patient's TUG score will be </= 15s without assistive device to indicate a decrease in risk of falling. (TARGET DATE: 07/29/2017)    Time 4   Period Weeks   Status New     PT SHORT TERM GOAL #4   Title PT will perform 6 Minute Walk Test, and patient will improve distance by >/= 7ft to indicate improvement in endurance. (TARGET DATE: 07/29/2017)    Time 4   Period Weeks   Status New     PT SHORT TERM GOAL #5   Title Patient will demonstrate ability to ambulate 154ft with LRAD on indoor surfaces with S from PT to indicate a decrease in risk of falling. (TARGET DATE: 07/29/2017)    Time 4   Period Weeks   Status New     Additional Short Term Goals   Additional Short Term Goals Yes     PT SHORT TERM GOAL #6   Title Patient will demonstrate ability to ambulate on ramp, curb, stairs (2 rails) with min guard from PT to indicate a decrease in risk of falling. (TARGET DATE: 07/29/2017)    Time 4   Period Weeks   Status New           PT Long Term Goals - 06/27/17 1319      PT LONG TERM GOAL #1   Title Patient will verbalize understanding and return demonstration for ongoing HEP to increase LE strength  and balance to decrease risk of falling. (TARGET DATE: 08/26/2017)    Time 8   Period Weeks   Status New     PT LONG TERM GOAL #2   Title Patient's gait velocity will be >/= 2.56ft/s with LRAD to indicate a decrease in risk of falling as a full community ambulator. (TARGET DATE: 08/26/2017)    Time 8   Period Weeks   Status New     PT LONG TERM GOAL #3   Title Patient's TUG will be </= 13s without an assistive device to indicate a decrease in her risk of falling. (TARGET DATE: 08/26/2017)    Time 8    Period Weeks   Status New     PT LONG TERM GOAL #4   Title PT will perform 6 Minute Walk Test, and patient will improve distance by >/= 150 ft to indicate improvement in endurance and a decrease in risk of falling. (TARGET DATE: 08/26/2017)    Time 8   Period Weeks   Status New     PT LONG TERM GOAL #5   Title Patient will demonstrate ability to ambulate 377ft including outdoor surfaces with LRAD and modified independence to indicate a decrease in risk of falling when ambulating in the community. (TARGET DATE: 08/26/2017)    Time 8   Period Weeks   Status New     Additional Long Term Goals   Additional Long Term Goals Yes     PT LONG TERM GOAL #6   Title Patient will demonstrate ability to ambulate on ramp, curb and stairs (1 rail) with modified independence to indicate a decrease in fall risk when ambulating in the community. (TARGET DATE: 08/26/2017   Time 8   Period Weeks   Status New     PT LONG TERM GOAL #7   Title Berg Balance Test >36/56 to indicate lower fall risk & improved ADLs.  (Target Date: 08/26/2017)   Time 8   Period Weeks   Status New     PT LONG TERM GOAL #8   Title Patient self reports using FOTO improved Functional Status by >10% (Initial was 40.51%).  (Target Date: 08/26/2017)   Time 8   Period Weeks   Status New                Plan - 06/27/17 1316    Clinical Impression Statement  Patient is an 83 year old female presenting to OPPT neuro with an evolving clinical presentation for a moderate complexity PT evaluation due to multiple falls. The following deficits were noted during the patient's exam: decreased strength in BLEs placing the patient at an increased risk for falling, shortness of breath when completing static balance tasks placing the patient at an increased risk for falling, and abnormal gait mechanics placing the patient at an increased risk for falling. The patient's TUG score with and without using her cane are 19.26s and 17.79s,  respectively, which both indicate the patient is at an increased risk for falling. The patient's gait velocity of 1.47ft/s with the cane, which is what she utilizes when in the community, indicates she is only a limited community ambulator, which indicates she is an increased risk for falling when ambulating in the full community. The patient's Berg Balance Test score of 27/56 indicates she is at a high risk for repeated falls. PT monitored patient's oxygen saturation and HR throughout session due to shortness of breath with exertion (including static balance activities), and patient's oxygen saturation remained  90%-95% throughout today's session. Her HR stayed in the mid to high 80's with intermittent increases to the low 100's with challenging activities such as balance with a narrow base of support and walking. Patient would benefit from skilled PT to address these impairments and functional limitations to maximize functional mobility independence and reduce falls risk.       History and Personal Factors relevant to plan of care: lives with granddaughter (not always at home available to help), HTN, hyperlipemia, anxiety and depression, asthma, obesity, sciatica, urinary incontinence, shortness of breath, osteoarthritis, low back surgery (8 years ago)        Clinical Presentation Evolving   Clinical Presentation due to: osteoarthritis in multiple joints, repeated falls, asthma with dyspnea upon exertion    Clinical Decision Making Moderate   Rehab Potential Good   Clinical Impairments Affecting Rehab Potential HTN, hyperlipemia, anxiety and depression, asthma, obesity, sciatica, urinary incontinence, shortness of breath, osteoarthritis, low back surgery (8 years ago)        PT Frequency 2x / week   PT Duration 8 weeks   PT Treatment/Interventions ADLs/Self Care Home Management;Neuromuscular re-education;Balance training;Therapeutic exercise;Therapeutic activities;Functional mobility training;Stair  training;Gait training;DME Instruction;Patient/family education;Energy conservation   PT Next Visit Plan perform 6 Minute Walk Test & MMT hip abd & ext; initiate HEP to increase LE strength and balance;    Consulted and Agree with Plan of Care Patient;Family member/caregiver   Family Member Consulted daughter and granddaughter       Patient will benefit from skilled therapeutic intervention in order to improve the following deficits and impairments:  Abnormal gait, Decreased activity tolerance, Decreased balance, Decreased mobility, Decreased knowledge of use of DME, Decreased endurance, Decreased strength, Difficulty walking  Visit Diagnosis: Unsteadiness on feet  History of falling  Other abnormalities of gait and mobility  Muscle weakness (generalized)      G-Codes - 2017-07-21 1336    Functional Assessment Tool Used (Outpatient Only) TUG with single point cane = 19.26s; gait velocity with single point cane = 1.36ft/s; Berg Balance Test score = 27/56   Functional Limitation Mobility: Walking and moving around   Mobility: Walking and Moving Around Current Status [phone]) At least 60 percent but less than 80 percent impaired, limited or restricted   Mobility: Walking and Moving Around Goal Status [phone]) At least 20 percent but less than 40 percent impaired, limited or restricted       Problem List Patient Active Problem List   Diagnosis Date Noted  . Need for influenza vaccination 01/31/2017  . Primary osteoarthritis of both knees 05/16/2016  . Sleep disorder 08/25/2015  . Reduced vision 03/19/2015  . Seasonal allergies 11/18/2014  . Unspecified vitamin D deficiency 05/13/2014  . Asthma, chronic 08/07/2013  . HTN (hypertension), benign 07/10/2012  . Vitamin D deficiency 04/10/2012  . Right leg weakness 11/10/2011  . Multiple falls 10/31/2011  . DEGENERATIVE DISC DISEASE, LUMBOSACRAL SPINE W/RADICULOPATHY 10/07/2010  . IGT (impaired glucose tolerance) 09/07/2010  . Anxiety  and depression 02/24/2009  . Trigger thumb of right hand 08/22/2008  . OTHER OSTEOPOROSIS 08/22/2008  . Hyperlipemia 02/07/2008  . Morbid obesity ([place]) 02/07/2008  . SCIATICA 01/04/2008   Arelia Sneddon, SPT 07/21/2017, 1:42 PM  Jamey Reas, PT, DPT  21-Jul-2017, 3:08 PM  [facility] [address] [place], [place], [address] Phone: [phone]   Fax:  [phone]  Name: [name] MRN: [id number] Date of Birth: Feb 06, 1934

## 2017-07-05 ENCOUNTER — Ambulatory Visit: Payer: Medicare Other | Admitting: Physical Therapy

## 2017-07-05 ENCOUNTER — Encounter: Payer: Self-pay | Admitting: Physical Therapy

## 2017-07-05 DIAGNOSIS — R2681 Unsteadiness on feet: Secondary | ICD-10-CM | POA: Diagnosis not present

## 2017-07-05 DIAGNOSIS — Z9181 History of falling: Secondary | ICD-10-CM | POA: Diagnosis not present

## 2017-07-05 DIAGNOSIS — R2689 Other abnormalities of gait and mobility: Secondary | ICD-10-CM

## 2017-07-05 DIAGNOSIS — M6281 Muscle weakness (generalized): Secondary | ICD-10-CM | POA: Diagnosis not present

## 2017-07-05 NOTE — Therapy (Signed)
[facility] [address] [place], [place], [address] Phone: [phone]   Fax:  [phone]  Physical Therapy Treatment  Patient Details  Name: [name] MRN: [id number] Date of Birth: April 10, 1934 Referring Provider: Fayrene Helper MD   Encounter Date: 07/05/2017      PT End of Session - 07/05/17 1025    Visit Number 2   Number of Visits 17   Date for PT Re-Evaluation 08/26/17   Authorization Type Medicare & G-codes every 10th visit    PT Start Time 0932   PT Stop Time 1015   PT Time Calculation (min) 43 min   Equipment Utilized During Treatment Gait belt   Activity Tolerance Patient tolerated treatment well   Behavior During Therapy [facility] for tasks assessed/performed      Past Medical History:  Diagnosis Date  . Asthma   . Back pain   . Depression   . Family history of arthritis   . Family history of diabetes mellitus   . Family history of ischemic heart disease   . Headache(784.0)   . High blood pressure   . High cholesterol   . Hyperlipidemia   . Hypertension   . Obesity   . Pain from implanted hardware 01/28/2012  . Personal history of unspecified circulatory disease   . Sciatica   . Shortness of breath   . Urinary incontinence     Past Surgical History:  Procedure Laterality Date  . ABDOMINAL HYSTERECTOMY    . BACK SURGERY  2009   Dr. Ronnald Ramp Stephens Nuerosurgeon  . COLONOSCOPY    . HARDWARE REMOVAL  01/28/2012   Procedure: HARDWARE REMOVAL;  Surgeon: Johnny Bridge, MD;  Location: [facility];  Service: Orthopedics;  Laterality: Right;  Right Knee  . right knee surgery after fracture  2006  . SPINE SURGERY      There were no vitals filed for this visit.      Subjective Assessment - 07/05/17 0936    Subjective Denies any changes or falls since last visit.    Patient is accompained by: Family member  great granddaughter     Pertinent History HTN, hyperlipemia,  anxiety and depression, asthma, obesity, sciatica, urinary incontinence, shortness of breath, osteoarthritis, low back surgery (8 years ago)       Limitations Lifting;Standing;Walking;House hold activities   How long can you sit comfortably? 1-2 hours    Patient Stated Goals walk again; be able to go the Y, church, and shopping    Currently in Pain? Yes   Pain Score 8    Pain Location Back   Pain Orientation Lower   Pain Descriptors / Indicators Aching   Pain Onset More than a month ago   Pain Frequency Intermittent            [facility] PT Assessment - 07/05/17 0930      ROM / Strength   AROM / PROM / Strength Strength     Strength   Strength Assessment Site Hip   Right Hip Extension 4+/5  tested in standing position with heavy BUE support   Right Hip ABduction 4/5   Left Hip Extension 4+/5  tested in standing position with heavy BUE support   Left Hip ABduction 4+/5     Ambulation/Gait   Ambulation/Gait Yes   Gait Pattern Step-to pattern;Decreased arm swing - left;Decreased step length - left;Decreased stance time - right;Decreased stride length;Decreased weight shift to right   Ambulation Surface Level;Indoor  6 Minute Walk- Baseline   6 Minute Walk- Baseline yes   BP (mmHg) 136/86   HR (bpm) 93   02 Sat (%RA) 92 %   Modified Borg Scale for Dyspnea 0.5- Very, very slight shortness of breath     6 Minute walk- Post Test   6 Minute Walk Post Test yes   BP (mmHg) (!)  170/91   HR (bpm) 133   02 Sat (%RA) 93 %  note patient had taken her inhaler with 1.5 minutes left   Modified Borg Scale for Dyspnea 8-  28 breaths per minute      6 minute walk test results    Aerobic Endurance Distance Walked 440   Endurance additional comments Patient required to take her inhaler during a 1 minute seated rest break during test (minute 3.5-4.5). SpO2 =87% at beginning of seated rest break, then oxygen saturation rose and stabilized at 96% following use of inhaler. Oxygen saturation  did not drop below 93% at the end of the test.                      [facility] Adult PT Treatment/Exercise - 07/05/17 0930      Transfers   Transfers Sit to Stand;Stand to Sit   Sit to Stand 5: Supervision;With upper extremity assist;With armrests;From chair/3-in-1  with cane    Stand to Sit 5: Supervision;With upper extremity assist;With armrests;To chair/3-in-1  with cane      Ambulation/Gait   Ambulation/Gait Assistance 5: Supervision;4: Min guard   Ambulation/Gait Assistance Details Requires S with intermittent min guard when fatigued and short of breath   Ambulation Distance (Feet) 263 Feet  177 & 263 (440 total in 6MWT)   Assistive device Straight cane  with quad tip      Exercises   Exercises Other Exercises   Other Exercises  PT introduced patient to back exercises (listed below). PT set up folder with patient's exercises, and plans to continue to add exercises in future sessions.      Vital Signs throughout PT session:  After walking into clinic: HR = 108bpm; SpO2 = 90%   After walking 226ft in 6 MWT: HR = 136bpm; SPO2 = 87%. Patient required to take her inhaler due to shortness of breath. SpO2 rose and stabilized to 96% prior to continuing ambulation in 6MWT. Rested for 1 minute.           PT Education - 07/05/17 0939    Education provided Yes   Education Details go to [facility] on non-PT days; proper height of cane and quad tip; back exercises for HEP    Person(s) Educated Patient   Methods Explanation;Demonstration;Tactile cues;Verbal cues;Handout   Comprehension Verbalized understanding;Returned demonstration        Bridging    Slowly raise buttocks from floor, keeping stomach tight. Repeat _10___ times per set. Do __1__ sets per session. Do _1___ sessions per day.  http://orth.exer.us/1097   Copyright  VHI. All rights reserved.   Anterior Pelvic Tilt (Supine)    Lie on back with knees bent. Arch back and Hold __3__ seconds. Flatten back  to relax. Repeat _10___ times. Do __1__ sessions per day.  http://[email or web address].exer.us/684   Copyright  VHI. All rights reserved.   Supine Hamstring Stretch    Lift one leg, placing hands behind thigh. Gently pull leg toward torso until a stretch is felt. Other leg is bent and back is flat against floor. Repeat with other leg. Repeat __10__ times. Do __1__  sessions per day.  http://[email or web address].exer.us/829   Copyright  VHI. All rights reserved.   Knee Roll    Lying on back, with knees bent and feet flat on floor, arms outstretched to sides, slowly roll both knees to side, hold 5 seconds. Back to starting position, hold 5 seconds. Then to opposite side, hold 5 seconds. Return to starting position. Keep shoulders and arms in contact with floor.   Copyright  VHI. All rights reserved.            PT Short Term Goals - 06/27/17 1328      PT SHORT TERM GOAL #1   Title Patient will verbalize understanding and return demonstration for initial HEP to increase LE strength and balance to decrease risk of falling. (TARGET DATE: 07/29/2017)    Time 4   Period Weeks   Status New     PT SHORT TERM GOAL #2   Title Patient's gait velocity will be >/= 2.49ft/s with LRAD to indicate a decrease in risk of falling. (TARGET DATE: 07/29/2017)    Time 4   Period Weeks   Status New     PT SHORT TERM GOAL #3   Title Patient's TUG score will be </= 15s without assistive device to indicate a decrease in risk of falling. (TARGET DATE: 07/29/2017)    Time 4   Period Weeks   Status New     PT SHORT TERM GOAL #4   Title PT will perform 6 Minute Walk Test, and patient will improve distance by >/= 49ft to indicate improvement in endurance. (TARGET DATE: 07/29/2017)    Time 4   Period Weeks   Status New     PT SHORT TERM GOAL #5   Title Patient will demonstrate ability to ambulate 133ft with LRAD on indoor surfaces with S from PT to indicate a decrease in risk of falling. (TARGET DATE: 07/29/2017)    Time 4    Period Weeks   Status New     Additional Short Term Goals   Additional Short Term Goals Yes     PT SHORT TERM GOAL #6   Title Patient will demonstrate ability to ambulate on ramp, curb, stairs (2 rails) with min guard from PT to indicate a decrease in risk of falling. (TARGET DATE: 07/29/2017)    Time 4   Period Weeks   Status New           PT Long Term Goals - 06/27/17 1319      PT LONG TERM GOAL #1   Title Patient will verbalize understanding and return demonstration for ongoing HEP to increase LE strength and balance to decrease risk of falling. (TARGET DATE: 08/26/2017)    Time 8   Period Weeks   Status New     PT LONG TERM GOAL #2   Title Patient's gait velocity will be >/= 2.58ft/s with LRAD to indicate a decrease in risk of falling as a full community ambulator. (TARGET DATE: 08/26/2017)    Time 8   Period Weeks   Status New     PT LONG TERM GOAL #3   Title Patient's TUG will be </= 13s without an assistive device to indicate a decrease in her risk of falling. (TARGET DATE: 08/26/2017)    Time 8   Period Weeks   Status New     PT LONG TERM GOAL #4   Title PT will perform 6 Minute Walk Test, and patient will improve distance by >/= 150 ft to indicate  improvement in endurance and a decrease in risk of falling. (TARGET DATE: 08/26/2017)    Time 8   Period Weeks   Status New     PT LONG TERM GOAL #5   Title Patient will demonstrate ability to ambulate 329ft including outdoor surfaces with LRAD and modified independence to indicate a decrease in risk of falling when ambulating in the community. (TARGET DATE: 08/26/2017)    Time 8   Period Weeks   Status New     Additional Long Term Goals   Additional Long Term Goals Yes     PT LONG TERM GOAL #6   Title Patient will demonstrate ability to ambulate on ramp, curb and stairs (1 rail) with modified independence to indicate a decrease in fall risk when ambulating in the community. (TARGET DATE: 08/26/2017   Time 8   Period  Weeks   Status New     PT LONG TERM GOAL #7   Title Berg Balance Test >36/56 to indicate lower fall risk & improved ADLs.  (Target Date: 08/26/2017)   Time 8   Period Weeks   Status New     PT LONG TERM GOAL #8   Title Patient self reports using FOTO improved Functional Status by >10% (Initial was 40.51%).  (Target Date: 08/26/2017)   Time 8   Period Weeks   Status New               Plan - 07/05/17 1028    Clinical Impression Statement Today's skilled PT session focused on assessing patient's baseline endurance via the 6 Minute Walk Test and initiating patient's HEP with exercises for her back due to her complaints of back pain. Patient required multiple seated rest breaks during session due primarily to shortness of breath with exertion and secondarily due to LE fatigue. PT monitored patient's vital signs throughout PT session. Oxygen saturation stayed between 87%-96%, and when she was in the high 80's/low 90's it was immediately following exertion, and within 1-2 minutes returned and stabilized in the mid-90's. Patient is making progress towards goals, and will benefit from continued skilled PT to address functional mobility deficits.    Rehab Potential Good   Clinical Impairments Affecting Rehab Potential HTN, hyperlipemia, anxiety and depression, asthma, obesity, sciatica, urinary incontinence, shortness of breath, osteoarthritis, low back surgery (8 years ago)        PT Frequency 2x / week   PT Duration 8 weeks   PT Treatment/Interventions ADLs/Self Care Home Management;Neuromuscular re-education;Balance training;Therapeutic exercise;Therapeutic activities;Functional mobility training;Stair training;Gait training;DME Instruction;Patient/family education;Energy conservation   PT Next Visit Plan progress/add to back exercises as indicated; progress balance activities; work on patient's endurance    Consulted and Agree with Plan of Care Patient;Family member/caregiver   Family Member  Consulted great granddaughter       Patient will benefit from skilled therapeutic intervention in order to improve the following deficits and impairments:  Abnormal gait, Decreased activity tolerance, Decreased balance, Decreased mobility, Decreased knowledge of use of DME, Decreased endurance, Decreased strength, Difficulty walking  Visit Diagnosis: Unsteadiness on feet  Other abnormalities of gait and mobility  Muscle weakness (generalized)     Problem List Patient Active Problem List   Diagnosis Date Noted  . Need for influenza vaccination 01/31/2017  . Primary osteoarthritis of both knees 05/16/2016  . Sleep disorder 08/25/2015  . Reduced vision 03/19/2015  . Seasonal allergies 11/18/2014  . Unspecified vitamin D deficiency 05/13/2014  . Asthma, chronic 08/07/2013  . HTN (hypertension), benign 07/10/2012  .  Vitamin D deficiency 04/10/2012  . Right leg weakness 11/10/2011  . Multiple falls 10/31/2011  . DEGENERATIVE DISC DISEASE, LUMBOSACRAL SPINE W/RADICULOPATHY 10/07/2010  . IGT (impaired glucose tolerance) 09/07/2010  . Anxiety and depression 02/24/2009  . Trigger thumb of right hand 08/22/2008  . OTHER OSTEOPOROSIS 08/22/2008  . Hyperlipemia 02/07/2008  . Morbid obesity ([place]) 02/07/2008  . SCIATICA 01/04/2008    Arelia Sneddon, SPT  07/05/2017, 12:17 PM  [facility] [address] [place], [place], [address] Phone: [phone]   Fax:  [phone]  Name: SHAWNA WEARING MRN: [id number] Date of Birth: 08-11-1934

## 2017-07-05 NOTE — Patient Instructions (Addendum)
   Bridging    Slowly raise buttocks from floor, keeping stomach tight. Repeat _10___ times per set. Do __1__ sets per session. Do _1___ sessions per day.  http://orth.exer.us/1097   Copyright  VHI. All rights reserved.   Anterior Pelvic Tilt (Supine)    Lie on back with knees bent. Arch back and Hold __3__ seconds. Flatten back to relax. Repeat _10___ times. Do __1__ sessions per day.  http://[email or web address].exer.us/684   Copyright  VHI. All rights reserved.   Supine Hamstring Stretch    Lift one leg, placing hands behind thigh. Gently pull leg toward torso until a stretch is felt. Other leg is bent and back is flat against floor. Repeat with other leg. Repeat __10__ times. Do __1__ sessions per day.  http://[email or web address].exer.us/829   Copyright  VHI. All rights reserved.   Knee Roll    Lying on back, with knees bent and feet flat on floor, arms outstretched to sides, slowly roll both knees to side, hold 5 seconds. Back to starting position, hold 5 seconds. Then to opposite side, hold 5 seconds. Return to starting position. Keep shoulders and arms in contact with floor.   Copyright  VHI. All rights reserved.

## 2017-07-13 ENCOUNTER — Ambulatory Visit: Payer: Medicare Other | Admitting: Physical Therapy

## 2017-07-14 ENCOUNTER — Ambulatory Visit: Payer: Medicare Other | Admitting: Rehabilitation

## 2017-07-20 ENCOUNTER — Encounter: Payer: Self-pay | Admitting: Physical Therapy

## 2017-07-20 ENCOUNTER — Ambulatory Visit: Payer: Medicare Other | Admitting: Physical Therapy

## 2017-07-20 VITALS — BP 100/75 | HR 78

## 2017-07-20 DIAGNOSIS — R2681 Unsteadiness on feet: Secondary | ICD-10-CM

## 2017-07-20 DIAGNOSIS — Z9181 History of falling: Secondary | ICD-10-CM

## 2017-07-20 DIAGNOSIS — M6281 Muscle weakness (generalized): Secondary | ICD-10-CM | POA: Diagnosis not present

## 2017-07-20 DIAGNOSIS — R2689 Other abnormalities of gait and mobility: Secondary | ICD-10-CM

## 2017-07-20 NOTE — Therapy (Signed)
[facility] [address] [place], [place], [address] Phone: [phone]   Fax:  [phone]  Physical Therapy Treatment  Patient Details  Name: [name] MRN: [id number] Date of Birth: 06-17-1934 Referring Provider: Fayrene Helper MD   Encounter Date: 07/20/2017      PT End of Session - 07/20/17 1252    Visit Number 3   Number of Visits 17   Date for PT Re-Evaluation 08/26/17   Authorization Type Medicare & G-codes every 10th visit    PT Start Time 1145   PT Stop Time 1235   PT Time Calculation (min) 50 min   Activity Tolerance Patient tolerated treatment well   Behavior During Therapy [facility] for tasks assessed/performed      Past Medical History:  Diagnosis Date  . Asthma   . Back pain   . Depression   . Family history of arthritis   . Family history of diabetes mellitus   . Family history of ischemic heart disease   . Headache(784.0)   . High blood pressure   . High cholesterol   . Hyperlipidemia   . Hypertension   . Obesity   . Pain from implanted hardware 01/28/2012  . Personal history of unspecified circulatory disease   . Sciatica   . Shortness of breath   . Urinary incontinence     Past Surgical History:  Procedure Laterality Date  . ABDOMINAL HYSTERECTOMY    . BACK SURGERY  2009   Dr. Ronnald Ramp [place] [facility]  . COLONOSCOPY    . HARDWARE REMOVAL  01/28/2012   Procedure: HARDWARE REMOVAL;  Surgeon: Johnny Bridge, MD;  Location: [facility];  Service: Orthopedics;  Laterality: Right;  Right Knee  . right knee surgery after fracture  2006  . SPINE SURGERY      Vitals:   07/20/17 1153  BP: 100/75  Pulse: 78  SpO2: 91%        Subjective Assessment - 07/20/17 1154    Subjective Reports one fall when she tried to sit on sectional sofa and went between cushions.  She has been performing exercises in recliner (bed too high) and reports back soreness after performing  exercises; soreness does not alleviate with time   Pertinent History HTN, hyperlipemia, anxiety and depression, asthma, obesity, sciatica, urinary incontinence, shortness of breath, osteoarthritis, low back surgery (8 years ago)       Limitations Lifting;Standing;Walking;House hold activities   How long can you sit comfortably? 1-2 hours    Patient Stated Goals walk again; be able to go the Y, church, and shopping    Currently in Pain? Yes    Bridging    Slowly raise buttocks from floor, keeping stomach tight. Repeat _10___ times per set. Do __1__ sets per session. Do _1___ sessions per day.  http://orth.exer.us/1097   Copyright  VHI. All rights reserved.   Anterior Pelvic Tilt (ADVISED TO CHANGE AND PERFORM IN SITTING)    Lie on back with knees bent. Arch back and Hold __3__ seconds. Flatten back to relax. Repeat _10___ times. Do __1__ sessions per day.  http://gt2.exer.us/684   Copyright  VHI. All rights reserved.   Supine Hamstring Stretch    Lift one leg, placing hands behind thigh. Gently pull leg toward torso until a stretch is felt. Other leg is bent and back is flat against floor. Repeat with other leg. Repeat __10__ times. Do __1__ sessions per day.  http://gt2.exer.us/829   Copyright  VHI. All  rights reserved.   Knee Roll--REMOVED FROM HEP DUE TO ARM RESTS ON RECLINER    Lying on back, with knees bent and feet flat on floor, arms outstretched to sides, slowly roll both knees to side, hold 5 seconds. Back to starting position, hold 5 seconds. Then to opposite side, hold 5 seconds. Return to starting position.                     [place] Adult PT Treatment/Exercise - 07/20/17 1244      Exercises   Exercises Lumbar;Knee/Hip     Lumbar Exercises: Stretches   Passive Hamstring Stretch 1 rep;60 seconds   Passive Hamstring Stretch Limitations bilat LE due to cramping   Lower Trunk Rotation 5 reps   Lower Trunk Rotation Limitations  Unable to perform at home because she performs exercises in recliner   Pelvic Tilt 5 reps   Pelvic Tilt Limitations anterior and posterior; unable to perform correctly in supine-advised pt to perform in sitting at edge of seat and UE on knees for support     Lumbar Exercises: Supine   Bridge Non-compliant;5 reps   Bridge Limitations cues to activate glutes first due to hamstring cramping; limited number of reps due to cramping     Knee/Hip Exercises: Aerobic   Stepper SCI FIT stepper at level 2.0 resistance x 7 minutes (6 + 1 min cool down) with pt maintaining Sp02 > 96%.                PT Education - 07/20/17 1249    Education Details adjusted cane to proper height and discussed correct direction of handle, adjusted HEP for pt to perform more correctly in recliner-see exercises listed   Person(s) Educated Patient   Methods Explanation   Comprehension Verbalized understanding;Returned demonstration          PT Short Term Goals - 06/27/17 1328      PT SHORT TERM GOAL #1   Title Patient will verbalize understanding and return demonstration for initial HEP to increase LE strength and balance to decrease risk of falling. (TARGET DATE: 07/29/2017)    Time 4   Period Weeks   Status New     PT SHORT TERM GOAL #2   Title Patient's gait velocity will be >/= 2.64ft/s with LRAD to indicate a decrease in risk of falling. (TARGET DATE: 07/29/2017)    Time 4   Period Weeks   Status New     PT SHORT TERM GOAL #3   Title Patient's TUG score will be </= 15s without assistive device to indicate a decrease in risk of falling. (TARGET DATE: 07/29/2017)    Time 4   Period Weeks   Status New     PT SHORT TERM GOAL #4   Title PT will perform 6 Minute Walk Test, and patient will improve distance by >/= 9ft to indicate improvement in endurance. (TARGET DATE: 07/29/2017)    Time 4   Period Weeks   Status New     PT SHORT TERM GOAL #5   Title Patient will demonstrate ability to ambulate 175ft  with LRAD on indoor surfaces with S from PT to indicate a decrease in risk of falling. (TARGET DATE: 07/29/2017)    Time 4   Period Weeks   Status New     Additional Short Term Goals   Additional Short Term Goals Yes     PT SHORT TERM GOAL #6   Title Patient will demonstrate ability to ambulate  on ramp, curb, stairs (2 rails) with min guard from PT to indicate a decrease in risk of falling. (TARGET DATE: 07/29/2017)    Time 4   Period Weeks   Status New           PT Long Term Goals - 06/27/17 1319      PT LONG TERM GOAL #1   Title Patient will verbalize understanding and return demonstration for ongoing HEP to increase LE strength and balance to decrease risk of falling. (TARGET DATE: 08/26/2017)    Time 8   Period Weeks   Status New     PT LONG TERM GOAL #2   Title Patient's gait velocity will be >/= 2.36ft/s with LRAD to indicate a decrease in risk of falling as a [name] community ambulator. (TARGET DATE: 08/26/2017)    Time 8   Period Weeks   Status New     PT LONG TERM GOAL #3   Title Patient's TUG will be </= 13s without an assistive device to indicate a decrease in her risk of falling. (TARGET DATE: 08/26/2017)    Time 8   Period Weeks   Status New     PT LONG TERM GOAL #4   Title PT will perform 6 Minute Walk Test, and patient will improve distance by >/= 150 ft to indicate improvement in endurance and a decrease in risk of falling. (TARGET DATE: 08/26/2017)    Time 8   Period Weeks   Status New     PT LONG TERM GOAL #5   Title Patient will demonstrate ability to ambulate 377ft including outdoor surfaces with LRAD and modified independence to indicate a decrease in risk of falling when ambulating in the community. (TARGET DATE: 08/26/2017)    Time 8   Period Weeks   Status New     Additional Long Term Goals   Additional Long Term Goals Yes     PT LONG TERM GOAL #6   Title Patient will demonstrate ability to ambulate on ramp, curb and stairs (1 rail) with modified  independence to indicate a decrease in fall risk when ambulating in the community. (TARGET DATE: 08/26/2017   Time 8   Period Weeks   Status New     PT LONG TERM GOAL #7   Title Berg Balance Test >36/56 to indicate lower fall risk & improved ADLs.  (Target Date: 08/26/2017)   Time 8   Period Weeks   Status New     PT LONG TERM GOAL #8   Title Patient self reports using FOTO improved Functional Status by >10% (Initial was 40.51%).  (Target Date: 08/26/2017)   Time 8   Period Weeks   Status New               Plan - 07/20/17 1252    Clinical Impression Statement Pt brought in new SPC with quad tip today; adjusted to proper height and educated pt on proper way to hold cane forwards.  Rest of treatment session today focused on review of HEP and adjustment of exercises due to prolonged back soreness after performing and pt having to perform in recliner due to her bed is too high and pt not able to get into floor.  Removed knee roll from HEP due to arm rests on chair and educated pt to perform pelvic tilts in sitting due to incorrect technique in supine.  Initiated endurance training on SCI FIT stepper x 7 minutes; pt tolerated well.  Will continue to progress  as pt tolerates.   Rehab Potential Good   Clinical Impairments Affecting Rehab Potential HTN, hyperlipemia, anxiety and depression, asthma, obesity, sciatica, urinary incontinence, shortness of breath, osteoarthritis, low back surgery (8 years ago)        PT Frequency 2x / week   PT Duration 8 weeks   PT Treatment/Interventions ADLs/Self Care Home Management;Neuromuscular re-education;Balance training;Therapeutic exercise;Therapeutic activities;Functional mobility training;Stair training;Gait training;DME Instruction;Patient/family education;Energy conservation   PT Next Visit Plan continue to review HEP and progress/add to back exercises as indicated-provide ones that pt can do in recliner-bed too high; progress balance activities; work  on patient's endurance    Consulted and Agree with Plan of Care Patient      Patient will benefit from skilled therapeutic intervention in order to improve the following deficits and impairments:  Abnormal gait, Decreased activity tolerance, Decreased balance, Decreased mobility, Decreased knowledge of use of DME, Decreased endurance, Decreased strength, Difficulty walking  Visit Diagnosis: Unsteadiness on feet  Other abnormalities of gait and mobility  Muscle weakness (generalized)  History of falling     Problem List Patient Active Problem List   Diagnosis Date Noted  . Need for influenza vaccination 01/31/2017  . Primary osteoarthritis of both knees 05/16/2016  . Sleep disorder 08/25/2015  . Reduced vision 03/19/2015  . Seasonal allergies 11/18/2014  . Unspecified vitamin D deficiency 05/13/2014  . Asthma, chronic 08/07/2013  . HTN (hypertension), benign 07/10/2012  . Vitamin D deficiency 04/10/2012  . Right leg weakness 11/10/2011  . Multiple falls 10/31/2011  . DEGENERATIVE DISC DISEASE, LUMBOSACRAL SPINE W/RADICULOPATHY 10/07/2010  . IGT (impaired glucose tolerance) 09/07/2010  . Anxiety and depression 02/24/2009  . Trigger thumb of right hand 08/22/2008  . OTHER OSTEOPOROSIS 08/22/2008  . Hyperlipemia 02/07/2008  . Morbid obesity ([place]) 02/07/2008  . SCIATICA 01/04/2008   Raylene Everts, PT, DPT 07/20/17    1:00 PM    [facility] [address] [place], [place], [address] Phone: [phone]   Fax:  [phone]  Name: AMAZING COWMAN MRN: [id number] Date of Birth: October 23, 1934

## 2017-07-22 ENCOUNTER — Ambulatory Visit: Payer: Medicare Other | Admitting: Rehabilitation

## 2017-07-22 ENCOUNTER — Encounter: Payer: Self-pay | Admitting: Rehabilitation

## 2017-07-22 VITALS — BP 130/87

## 2017-07-22 DIAGNOSIS — Z9181 History of falling: Secondary | ICD-10-CM | POA: Diagnosis not present

## 2017-07-22 DIAGNOSIS — R2689 Other abnormalities of gait and mobility: Secondary | ICD-10-CM

## 2017-07-22 DIAGNOSIS — R2681 Unsteadiness on feet: Secondary | ICD-10-CM

## 2017-07-22 DIAGNOSIS — M6281 Muscle weakness (generalized): Secondary | ICD-10-CM

## 2017-07-22 NOTE — Therapy (Signed)
[facility] [address] [place], [place], [address] Phone: [phone]   Fax:  [phone]  Physical Therapy Treatment  Patient Details  Name: [name] MRN: [id number] Date of Birth: 1934/02/06 Referring Provider: Fayrene Helper MD   Encounter Date: 07/22/2017      PT End of Session - 07/22/17 1521    Visit Number 4   Number of Visits 17   Date for PT Re-Evaluation 08/26/17   Authorization Type Medicare & G-codes every 10th visit    PT Start Time 1018   PT Stop Time 1101   PT Time Calculation (min) 43 min   Activity Tolerance Patient tolerated treatment well   Behavior During Therapy [facility] for tasks assessed/performed      Past Medical History:  Diagnosis Date  . Asthma   . Back pain   . Depression   . Family history of arthritis   . Family history of diabetes mellitus   . Family history of ischemic heart disease   . Headache(784.0)   . High blood pressure   . High cholesterol   . Hyperlipidemia   . Hypertension   . Obesity   . Pain from implanted hardware 01/28/2012  . Personal history of unspecified circulatory disease   . Sciatica   . Shortness of breath   . Urinary incontinence     Past Surgical History:  Procedure Laterality Date  . ABDOMINAL HYSTERECTOMY    . BACK SURGERY  2009   Dr. Ronnald Ramp [place] [facility]os[facility]  . COLONOSCOPY    . HARDWARE REMOVAL  01/28/2012   Procedure: HARDWARE REMOVAL;  Surgeon: Johnny Bridge, MD;  Location: [facility];  Service: Orthopedics;  Laterality: Right;  Right Knee  . right knee surgery after fracture  2006  . SPINE SURGERY      Vitals:   07/22/17 1027  BP: 130/87        Subjective Assessment - 07/22/17 1022    Subjective Pt reports she feels dizzy this morning, but no falls since last visit.     Patient is accompained by: Family member  great grandson   Pertinent History HTN, hyperlipemia, anxiety and depression, asthma,  obesity, sciatica, urinary incontinence, shortness of breath, osteoarthritis, low back surgery (8 years ago)       Limitations Lifting;Standing;Walking;House hold activities   How long can you sit comfortably? 1-2 hours    Patient Stated Goals walk again; be able to go the Y, church, and shopping    Currently in Pain? No/denies                         [facility] Adult PT Treatment/Exercise - 07/22/17 0001      Self-Care   Self-Care Other Self-Care Comments   Other Self-Care Comments  Went over pts symptoms during session.  She reports dizziness today, some nausea last night and arm pain that was not related to this as she injured it during a fall.  Provided education that if she continued to feel these symptoms she should contact PCP today or if unable to get in with them, visit urgent care.  Pt verbalized understanding.       Neuro Re-ed    Neuro Re-ed Details  Performed standing balance in // bars standing on foam balance beam x 2 sets of 20 secs with BUE>single>no UE support within time.  Progressed to standing on beam without support while performing head turns side to  side x 10 reps x 2 sets.  Cues for posture and utilizing hip strategy.  Again, pt needing rest breaks in between balance exercises.       Exercises   Exercises Other Exercises   Other Exercises  Performed side stepping with squats in // bars x 6 reps in each direction (seated rest break in between) with cues for posture and technique.  Lateral stepping over two balance beams with cues for increasing hip flex and speed to address strength and endurance.  Performed x 10 reps.  Progressed to forward and backwards stepping over same two beams x 10 reps.  Again cues for posture and larger steps. Pt needing rest breaks in between all exercises due to SOB and feeling dizzy.  Note BP remained in 130's-140's/80's.                  PT Education - 07/22/17 1520    Education provided Yes   Education Details seeking  medical attention if symptoms persist.    Person(s) Educated Patient   Methods Explanation   Comprehension Verbalized understanding          PT Short Term Goals - 06/27/17 1328      PT SHORT TERM GOAL #1   Title Patient will verbalize understanding and return demonstration for initial HEP to increase LE strength and balance to decrease risk of falling. (TARGET DATE: 07/29/2017)    Time 4   Period Weeks   Status New     PT SHORT TERM GOAL #2   Title Patient's gait velocity will be >/= 2.48ft/s with LRAD to indicate a decrease in risk of falling. (TARGET DATE: 07/29/2017)    Time 4   Period Weeks   Status New     PT SHORT TERM GOAL #3   Title Patient's TUG score will be </= 15s without assistive device to indicate a decrease in risk of falling. (TARGET DATE: 07/29/2017)    Time 4   Period Weeks   Status New     PT SHORT TERM GOAL #4   Title PT will perform 6 Minute Walk Test, and patient will improve distance by >/= 69ft to indicate improvement in endurance. (TARGET DATE: 07/29/2017)    Time 4   Period Weeks   Status New     PT SHORT TERM GOAL #5   Title Patient will demonstrate ability to ambulate 142ft with LRAD on indoor surfaces with S from PT to indicate a decrease in risk of falling. (TARGET DATE: 07/29/2017)    Time 4   Period Weeks   Status New     Additional Short Term Goals   Additional Short Term Goals Yes     PT SHORT TERM GOAL #6   Title Patient will demonstrate ability to ambulate on ramp, curb, stairs (2 rails) with min guard from PT to indicate a decrease in risk of falling. (TARGET DATE: 07/29/2017)    Time 4   Period Weeks   Status New           PT Long Term Goals - 06/27/17 1319      PT LONG TERM GOAL #1   Title Patient will verbalize understanding and return demonstration for ongoing HEP to increase LE strength and balance to decrease risk of falling. (TARGET DATE: 08/26/2017)    Time 8   Period Weeks   Status New     PT LONG TERM GOAL #2   Title  Patient's gait velocity will be >/= 2.58ft/s  with LRAD to indicate a decrease in risk of falling as a full community ambulator. (TARGET DATE: 08/26/2017)    Time 8   Period Weeks   Status New     PT LONG TERM GOAL #3   Title Patient's TUG will be </= 13s without an assistive device to indicate a decrease in her risk of falling. (TARGET DATE: 08/26/2017)    Time 8   Period Weeks   Status New     PT LONG TERM GOAL #4   Title PT will perform 6 Minute Walk Test, and patient will improve distance by >/= 150 ft to indicate improvement in endurance and a decrease in risk of falling. (TARGET DATE: 08/26/2017)    Time 8   Period Weeks   Status New     PT LONG TERM GOAL #5   Title Patient will demonstrate ability to ambulate 355ft including outdoor surfaces with LRAD and modified independence to indicate a decrease in risk of falling when ambulating in the community. (TARGET DATE: 08/26/2017)    Time 8   Period Weeks   Status New     Additional Long Term Goals   Additional Long Term Goals Yes     PT LONG TERM GOAL #6   Title Patient will demonstrate ability to ambulate on ramp, curb and stairs (1 rail) with modified independence to indicate a decrease in fall risk when ambulating in the community. (TARGET DATE: 08/26/2017   Time 8   Period Weeks   Status New     PT LONG TERM GOAL #7   Title Berg Balance Test >36/56 to indicate lower fall risk & improved ADLs.  (Target Date: 08/26/2017)   Time 8   Period Weeks   Status New     PT LONG TERM GOAL #8   Title Patient self reports using FOTO improved Functional Status by >10% (Initial was 40.51%).  (Target Date: 08/26/2017)   Time 8   Period Weeks   Status New               Plan - 07/22/17 1521    Clinical Impression Statement Pt reports walking is better with quad tip cane.  Note she is having increased dizziness today, however BP WFL.  Educated to seek medical attention if symptoms persist.  Also worked on BLE strength, endurance,  and balance.  Pt needing seated rest breaks between each exercise due to fatigue and dizziness.  HR and O2 remained normal within session.    Rehab Potential Good   Clinical Impairments Affecting Rehab Potential HTN, hyperlipemia, anxiety and depression, asthma, obesity, sciatica, urinary incontinence, shortness of breath, osteoarthritis, low back surgery (8 years ago)        PT Frequency 2x / week   PT Duration 8 weeks   PT Treatment/Interventions ADLs/Self Care Home Management;Neuromuscular re-education;Balance training;Therapeutic exercise;Therapeutic activities;Functional mobility training;Stair training;Gait training;DME Instruction;Patient/family education;Energy conservation   PT Next Visit Plan continue to review HEP and progress/add to back exercises as indicated-provide ones that pt can do in recliner-bed too high; progress balance activities; work on patient's endurance    Consulted and Agree with Plan of Care Patient      Patient will benefit from skilled therapeutic intervention in order to improve the following deficits and impairments:  Abnormal gait, Decreased activity tolerance, Decreased balance, Decreased mobility, Decreased knowledge of use of DME, Decreased endurance, Decreased strength, Difficulty walking  Visit Diagnosis: Unsteadiness on feet  Other abnormalities of gait and mobility  Muscle weakness (generalized)  History of falling     Problem List Patient Active Problem List   Diagnosis Date Noted  . Need for influenza vaccination 01/31/2017  . Primary osteoarthritis of both knees 05/16/2016  . Sleep disorder 08/25/2015  . Reduced vision 03/19/2015  . Seasonal allergies 11/18/2014  . Unspecified vitamin D deficiency 05/13/2014  . Asthma, chronic 08/07/2013  . HTN (hypertension), benign 07/10/2012  . Vitamin D deficiency 04/10/2012  . Right leg weakness 11/10/2011  . Multiple falls 10/31/2011  . DEGENERATIVE DISC DISEASE, LUMBOSACRAL SPINE  W/RADICULOPATHY 10/07/2010  . IGT (impaired glucose tolerance) 09/07/2010  . Anxiety and depression 02/24/2009  . Trigger thumb of right hand 08/22/2008  . OTHER OSTEOPOROSIS 08/22/2008  . Hyperlipemia 02/07/2008  . Morbid obesity ([place]) 02/07/2008  . SCIATICA 01/04/2008    Cameron Sprang, PT, MPT [facility] [address] [place], [place], [address] Phone: [phone]   Fax:  [phone] 07/22/17, 3:24 PM  Name: CHRISTYNE MCCAIN MRN: [id number] Date of Birth: 05-Nov-1934

## 2017-07-27 ENCOUNTER — Encounter: Payer: Self-pay | Admitting: Physical Therapy

## 2017-07-27 ENCOUNTER — Ambulatory Visit: Payer: Medicare Other | Attending: Family Medicine | Admitting: Physical Therapy

## 2017-07-27 DIAGNOSIS — R2681 Unsteadiness on feet: Secondary | ICD-10-CM | POA: Diagnosis not present

## 2017-07-27 DIAGNOSIS — M6281 Muscle weakness (generalized): Secondary | ICD-10-CM | POA: Diagnosis not present

## 2017-07-27 DIAGNOSIS — R2689 Other abnormalities of gait and mobility: Secondary | ICD-10-CM | POA: Insufficient documentation

## 2017-07-27 DIAGNOSIS — Z9181 History of falling: Secondary | ICD-10-CM | POA: Insufficient documentation

## 2017-07-27 NOTE — Patient Instructions (Addendum)
Bridge    Lie back, legs bent. Inhale, pressing hips up. Keeping ribs in, lengthen lower back. Exhale, rolling down along spine from top. Hold 3-5 seconds.  Repeat __10__ times. Do ___1_ sessions per day.  http://pm.exer.us/55   Copyright  VHI. All rights reserved.  Knee Roll    Lying on back, with knees bent and feet flat on floor, arms outstretched to sides, slowly roll both knees to side, hold 30-60 seconds. Back to starting position, hold 15 seconds. Then to opposite side, hold 30-60 seconds. Return to starting position. Keep shoulders and arms in contact with floor.   Copyright  VHI. All rights reserved.     Pelvic Tilt: Posterior - Legs Bent (Supine)    Tighten stomach and flatten back by rolling pelvis down. Hold _3-5___ seconds. Relax. Repeat _10___ times per session. Do _1-2___ sessions per day.  http://orth.exer.us/203   Copyright  VHI. All rights reserved.  Hamstring Stretch (Sitting)    Sitting, extend one leg and place hands on same thigh for support. Keeping torso straight, lean forward, sliding hands down leg, until a stretch is felt in back of thigh. Hold ___30-60_ seconds. Repeat with other leg.  Copyright  VHI. All rights reserved.  Gastroc / Heel Cord Stretch - Seated With Towel    Sit on bed or couch with leg out straight, towel around ball of foot. Gently pull foot in toward body, stretching heel cord and calf. Hold for _30-60__ seconds. Repeat on involved leg. Repeat _2-3__ times. Do _1-3__ times per day.  Copyright  VHI. All rights reserved.    Combine 2 exercises above  First pull foot up with towel or sheet. Then lean trunk forward.  Hold 30-60 seconds. Repeat 2-3 reps on each leg. Do 1-3 sessions per day.           Edema Edema is when you have too much fluid in your body or under your skin. Edema may make your legs, feet, and ankles swell up. Swelling is also common in looser tissues, like around your eyes. This is a common  condition. It gets more common as you get older. There are many possible causes of edema. Eating too much salt (sodium) and being on your feet or sitting for a long time can cause edema in your legs, feet, and ankles. Hot weather may make edema worse. Edema is usually painless. Your skin may look swollen or shiny. Follow these instructions at home:  Keep the swollen body part raised (elevated) above the level of your heart when you are sitting or lying down.  Do not sit still or stand for a long time.  Do not wear tight clothes. Do not wear garters on your upper legs.  Exercise your legs. This can help the swelling go down.  Wear elastic bandages or support stockings as told by your doctor.  Eat a low-salt (low-sodium) diet to reduce fluid as told by your doctor.  Depending on the cause of your swelling, you may need to limit how much fluid you drink (fluid restriction).  Take over-the-counter and prescription medicines only as told by your doctor. Contact a doctor if:  Treatment is not working.  You have heart, liver, or kidney disease and have symptoms of edema.  You have sudden and unexplained weight gain. Get help right away if:  You have shortness of breath or chest pain.  You cannot breathe when you lie down.  You have pain, redness, or warmth in the swollen areas.  You  have heart, liver, or kidney disease and get edema all of a sudden.  You have a fever and your symptoms get worse all of a sudden. Summary  Edema is when you have too much fluid in your body or under your skin.  Edema may make your legs, feet, and ankles swell up. Swelling is also common in looser tissues, like around your eyes.  Raise (elevate) the swollen body part above the level of your heart when you are sitting or lying down.  Follow your doctor's instructions about diet and how much fluid you can drink (fluid restriction). This information is not intended to replace advice given to you by  your health care provider. Make sure you discuss any questions you have with your health care provider. Document Released: 05/31/2008 Document Revised: 12/31/2016 Document Reviewed: 12/31/2016 Elsevier Interactive Patient Education  2017 [employer].

## 2017-07-28 NOTE — Therapy (Signed)
[facility] [address] [place], [place], [address] Phone: [phone]   Fax:  [phone]  Physical Therapy Treatment  Patient Details  Name: [name] MRN: [id number] Date of Birth: [birth year] Referring Provider: Fayrene Helper MD   Encounter Date: 07/27/2017      PT End of Session - 07/27/17 1630    Visit Number 5   Number of Visits 17   Date for PT Re-Evaluation 08/26/17   Authorization Type Medicare & G-codes every 10th visit; Tricare secondary NO PTAs    PT Start Time 0845   PT Stop Time 0930   PT Time Calculation (min) 45 min   Activity Tolerance Patient tolerated treatment well   Behavior During Therapy WFL for tasks assessed/performed      Past Medical History:  Diagnosis Date  . Asthma   . Back pain   . Depression   . Family history of arthritis   . Family history of diabetes mellitus   . Family history of ischemic heart disease   . Headache(784.0)   . High blood pressure   . High cholesterol   . Hyperlipidemia   . Hypertension   . Obesity   . Pain from implanted hardware 01/28/2012  . Personal history of unspecified circulatory disease   . Sciatica   . Shortness of breath   . Urinary incontinence     Past Surgical History:  Procedure Laterality Date  . ABDOMINAL HYSTERECTOMY    . BACK SURGERY  2009   Dr. Ronnald Ramp [place] [facility]  . COLONOSCOPY    . HARDWARE REMOVAL  01/28/2012   Procedure: HARDWARE REMOVAL;  Surgeon: Johnny Bridge, MD;  Location: [facility];  Service: Orthopedics;  Laterality: Right;  Right Knee  . right knee surgery after fracture  2006  . SPINE SURGERY      There were no vitals filed for this visit.      Subjective Assessment - 07/27/17 0843    Subjective She denies falls. Dizzy is gone. She purchased a cane.      Patient is accompained by: Family member   Pertinent History HTN, hyperlipemia, anxiety and depression, asthma, obesity,  sciatica, urinary incontinence, shortness of breath, osteoarthritis, low back surgery (8 years ago)       Limitations Lifting;Standing;Walking;House hold activities   Patient Stated Goals walk again; be able to go the Y, church, and shopping    Currently in Pain? Yes   Pain Score 8    Pain Location Calf   Pain Orientation Right   Pain Descriptors / Indicators Other (Comment)  pulling   Pain Type Acute pain   Pain Onset In the past 7 days   Pain Frequency Intermittent   Aggravating Factors  walking   Pain Relieving Factors sitting down   Multiple Pain Sites No       Therapeutic Exercise: See pt education on HEP Self-Care: PT instructed in edema management                          PT Education - 07/27/17 0900    Education provided Yes   Education Details reviewed HEP, changed hamstring stretch, post. pelvic tilt, added gastroc stretch;  instructed in edema management including proper elevation.    Person(s) Educated Patient   Methods Explanation;Demonstration;Tactile cues;Verbal cues;Handout   Comprehension Verbalized understanding;Returned demonstration;Verbal cues required;Tactile cues required;Need further instruction          PT  Short Term Goals - 07/27/17 1631      PT SHORT TERM GOAL #1   Title Patient will verbalize understanding and return demonstration for initial HEP to increase LE strength and balance to decrease risk of falling. (TARGET DATE: 07/29/2017)    Time 4   Period Weeks   Status On-going     PT SHORT TERM GOAL #2   Title Patient's gait velocity will be >/= 2.2ft/s with LRAD to indicate a decrease in risk of falling. (TARGET DATE: 07/29/2017)    Time 4   Period Weeks   Status On-going     PT SHORT TERM GOAL #3   Title Patient's TUG score will be </= 15s without assistive device to indicate a decrease in risk of falling. (TARGET DATE: 07/29/2017)    Time 4   Period Weeks   Status On-going     PT SHORT TERM GOAL #4   Title PT will  perform 6 Minute Walk Test, and patient will improve distance by >/= 42ft to indicate improvement in endurance. (TARGET DATE: 07/29/2017)    Time 4   Period Weeks   Status On-going     PT SHORT TERM GOAL #5   Title Patient will demonstrate ability to ambulate 162ft with LRAD on indoor surfaces with S from PT to indicate a decrease in risk of falling. (TARGET DATE: 07/29/2017)    Time 4   Period Weeks   Status On-going     PT SHORT TERM GOAL #6   Title Patient will demonstrate ability to ambulate on ramp, curb, stairs (2 rails) with min guard from PT to indicate a decrease in risk of falling. (TARGET DATE: 07/29/2017)    Time 4   Period Weeks   Status On-going           PT Long Term Goals - 07/27/17 1632      PT LONG TERM GOAL #1   Title Patient will verbalize understanding and return demonstration for ongoing HEP to increase LE strength and balance to decrease risk of falling. (TARGET DATE: 08/26/2017)    Time 8   Period Weeks   Status On-going     PT LONG TERM GOAL #2   Title Patient's gait velocity will be >/= 2.70ft/s with LRAD to indicate a decrease in risk of falling as a full community ambulator. (TARGET DATE: 08/26/2017)    Time 8   Period Weeks   Status On-going     PT LONG TERM GOAL #3   Title Patient's TUG will be </= 13s without an assistive device to indicate a decrease in her risk of falling. (TARGET DATE: 08/26/2017)    Time 8   Period Weeks   Status On-going     PT LONG TERM GOAL #4   Title PT will perform 6 Minute Walk Test, and patient will improve distance by >/= 150 ft to indicate improvement in endurance and a decrease in risk of falling. (TARGET DATE: 08/26/2017)    Baseline Initial 6-minute Walk Test = 440'   Time 8   Period Weeks   Status On-going     PT LONG TERM GOAL #5   Title Patient will demonstrate ability to ambulate 340ft including outdoor surfaces with LRAD and modified independence to indicate a decrease in risk of falling when ambulating in  the community. (TARGET DATE: 08/26/2017)    Time 8   Period Weeks   Status On-going     PT LONG TERM GOAL #6   Title Patient  will demonstrate ability to ambulate on ramp, curb and stairs (1 rail) with modified independence to indicate a decrease in fall risk when ambulating in the community. (TARGET DATE: 08/26/2017   Time 8   Period Weeks   Status On-going     PT LONG TERM GOAL #7   Title Berg Balance Test >36/56 to indicate lower fall risk & improved ADLs.  (Target Date: 08/26/2017)   Time 8   Period Weeks   Status On-going     PT LONG TERM GOAL #8   Title Patient self reports using FOTO improved Functional Status by >10% (Initial was 40.51%).  (Target Date: 08/26/2017)   Time 8   Period Weeks   Status On-going               Plan - 07/27/17 1635    Clinical Impression Statement Patient appears to have a better understanding of HEP for back and edema management.    Rehab Potential Good   Clinical Impairments Affecting Rehab Potential HTN, hyperlipemia, anxiety and depression, asthma, obesity, sciatica, urinary incontinence, shortness of breath, osteoarthritis, low back surgery (8 years ago)        PT Frequency 2x / week   PT Duration 8 weeks   PT Treatment/Interventions ADLs/Self Care Home Management;Neuromuscular re-education;Balance training;Therapeutic exercise;Therapeutic activities;Functional mobility training;Stair training;Gait training;DME Instruction;Patient/family education;Energy conservation   PT Next Visit Plan standing exercises for balance including working on endurance. Gait with cane quad tip    Consulted and Agree with Plan of Care Patient      Patient will benefit from skilled therapeutic intervention in order to improve the following deficits and impairments:  Abnormal gait, Decreased activity tolerance, Decreased balance, Decreased mobility, Decreased knowledge of use of DME, Decreased endurance, Decreased strength, Difficulty walking  Visit  Diagnosis: Muscle weakness (generalized)  Other abnormalities of gait and mobility     Problem List Patient Active Problem List   Diagnosis Date Noted  . Need for influenza vaccination 01/31/2017  . Primary osteoarthritis of both knees 05/16/2016  . Sleep disorder 08/25/2015  . Reduced vision 03/19/2015  . Seasonal allergies 11/18/2014  . Unspecified vitamin D deficiency 05/13/2014  . Asthma, chronic 08/07/2013  . HTN (hypertension), benign 07/10/2012  . Vitamin D deficiency 04/10/2012  . Right leg weakness 11/10/2011  . Multiple falls 10/31/2011  . DEGENERATIVE DISC DISEASE, LUMBOSACRAL SPINE W/RADICULOPATHY 10/07/2010  . IGT (impaired glucose tolerance) 09/07/2010  . Anxiety and depression 02/24/2009  . Trigger thumb of right hand 08/22/2008  . OTHER OSTEOPOROSIS 08/22/2008  . Hyperlipemia 02/07/2008  . Morbid obesity ([place]) 02/07/2008  . SCIATICA 01/04/2008    Clenton Esper PT, DPT 07/28/2017, 6:37 AM  [facility] [address] [place], [place], [address] Phone: [phone]   Fax:  [phone]  Name: [name] MRN: [id number] Date of Birth: [birth year]

## 2017-07-29 ENCOUNTER — Encounter: Payer: Self-pay | Admitting: Physical Therapy

## 2017-07-29 ENCOUNTER — Ambulatory Visit: Payer: Medicare Other | Admitting: Physical Therapy

## 2017-07-29 DIAGNOSIS — R2689 Other abnormalities of gait and mobility: Secondary | ICD-10-CM | POA: Diagnosis not present

## 2017-07-29 DIAGNOSIS — M6281 Muscle weakness (generalized): Secondary | ICD-10-CM

## 2017-07-29 DIAGNOSIS — R2681 Unsteadiness on feet: Secondary | ICD-10-CM | POA: Diagnosis not present

## 2017-07-29 DIAGNOSIS — Z9181 History of falling: Secondary | ICD-10-CM | POA: Diagnosis not present

## 2017-07-29 NOTE — Therapy (Signed)
[facility] [address] [place], [place], [address] Phone: [phone]   Fax:  [phone]  Physical Therapy Treatment  Patient Details  Name: [name] MRN: [id number] Date of Birth: 07-Aug-1934 Referring Provider: Fayrene Helper MD   Encounter Date: 07/29/2017      PT End of Session - 07/29/17 0935    Visit Number 6   Number of Visits 17   Date for PT Re-Evaluation 08/26/17   Authorization Type Medicare & G-codes every 10th visit; Tricare secondary NO PTAs    PT Start Time [phone]   PT Stop Time 0933   PT Time Calculation (min) 41 min   Activity Tolerance Patient limited by pain   Behavior During Therapy [facility] for tasks assessed/performed      Past Medical History:  Diagnosis Date  . Asthma   . Back pain   . Depression   . Family history of arthritis   . Family history of diabetes mellitus   . Family history of ischemic heart disease   . Headache(784.0)   . High blood pressure   . High cholesterol   . Hyperlipidemia   . Hypertension   . Obesity   . Pain from implanted hardware 01/28/2012  . Personal history of unspecified circulatory disease   . Sciatica   . Shortness of breath   . Urinary incontinence     Past Surgical History:  Procedure Laterality Date  . ABDOMINAL HYSTERECTOMY    . BACK SURGERY  2009   Dr. Ronnald Ramp  Nuerosurgeon  . COLONOSCOPY    . HARDWARE REMOVAL  01/28/2012   Procedure: HARDWARE REMOVAL;  Surgeon: Johnny Bridge, MD;  Location: [facility];  Service: Orthopedics;  Laterality: Right;  Right Knee  . right knee surgery after fracture  2006  . SPINE SURGERY      There were no vitals filed for this visit.      Subjective Assessment - 07/29/17 0855    Subjective Reporting increased pain in calf today; reports it feels like there is "heat" in it.  Is having increased difficulty getting off her low toilet; discussed options to raise toilet.   Patient is  accompained by: Family member   Pertinent History HTN, hyperlipemia, anxiety and depression, asthma, obesity, sciatica, urinary incontinence, shortness of breath, osteoarthritis, low back surgery (8 years ago)       Limitations Lifting;Standing;Walking;House hold activities   How long can you sit comfortably? 1-2 hours    Patient Stated Goals walk again; be able to go the Y, church, and shopping    Currently in Pain? Yes   Pain Score 9    Pain Location Calf   Pain Orientation Right   Pain Descriptors / Indicators Burning   Pain Type Acute pain   Pain Onset In the past 7 days            [facility] PT Assessment - 07/29/17 0917      Ambulation/Gait   Ambulation/Gait Yes   Ambulation/Gait Assistance 5: Supervision;4: Min guard   Ambulation/Gait Assistance Details supervision and intermittent min A for balance due to intermittent toe catching floor causing anterior LOB   Ambulation Distance (Feet) 115 Feet  limited today due to calf pain   Assistive device Straight cane   Gait Pattern Step-through pattern;Decreased stance time - right;Decreased dorsiflexion - left;Decreased dorsiflexion - right;Poor foot clearance - left;Poor foot clearance - right;Trunk flexed   Ambulation Surface Level;Indoor   Gait velocity 1.53  ft/sec with quad tip SPC   Stairs Yes   Stairs Assistance 4: Min assist   Stairs Assistance Details (indicate cue type and reason) verbal cues to ascend with LLE and descend with RLE due to calf pain   Stair Management Technique One rail Left;With cane;Step to pattern;Forwards   Number of Stairs 4   Height of Stairs 6   Gait Comments Unable to perform ramp and curb today due to calf pain     Standardized Balance Assessment   10 Meter Walk 21.43 seconds or 1.53 ft/sec with quad tip SPC     Timed Up and Go Test   TUG Normal TUG   Normal TUG (seconds) 18.15                             PT Education - 07/29/17 1109    Education provided Yes    Education Details discussed options for raising toilet seat depending on space in bathroom-purchasing raised seat to go on top of toilet or using current BSC over toilet.  Clinical findings with STG assessment and decreased scores today likely due to calf pain.  Advised to use ice and rest R calf this weekend and if symptoms worsen with redness, warmth and edema to contact MD or go to urgent care and be assessed for blood clot.     Person(s) Educated Patient   Methods Explanation   Comprehension Verbalized understanding          PT Short Term Goals - 07/29/17 0910      PT SHORT TERM GOAL #1   Title Patient will verbalize understanding and return demonstration for initial HEP to increase LE strength and balance to decrease risk of falling. (TARGET DATE: 07/29/2017)    Baseline ongoing; HEP has been adjusted recently   Time 4   Period Weeks   Status On-going     PT SHORT TERM GOAL #2   Title Patient's gait velocity will be >/= 2.52f/s with LRAD to indicate a decrease in risk of falling. (TARGET DATE: 07/29/2017)    Baseline 21.43 seconds with cane with quad tip or 1.572fsec; reporting 9/10 R calf pain   Time 4   Period Weeks   Status Not Met     PT SHORT TERM GOAL #3   Title Patient's TUG score will be </= 15s without assistive device to indicate a decrease in risk of falling. (TARGET DATE: 07/29/2017)    Baseline 18.15 seconds; reporting 9/10 R calf pain today-slow during pivoting   Time 4   Period Weeks   Status Not Met     PT SHORT TERM GOAL #4   Title PT will perform 6 Minute Walk Test, and patient will improve distance by >/= 7557fo indicate improvement in endurance. (TARGET DATE: 07/29/2017)    Baseline unable to perform due to calf pain   Time 4   Period Weeks   Status On-going     PT SHORT TERM GOAL #5   Title Patient will demonstrate ability to ambulate 150f36fth LRAD on indoor surfaces with S from PT to indicate a decrease in risk of falling. (TARGET DATE: 07/29/2017)     Baseline 115' with supervision-min A due to calf pain and catching foot on floor with LOB   Time 4   Period Weeks   Status Not Met     PT SHORT TERM GOAL #6   Title Patient will demonstrate ability to ambulate on  ramp, curb, stairs (2 rails) with min guard from PT to indicate a decrease in risk of falling. (TARGET DATE: 07/29/2017)    Baseline Stairs with min A but could not perform ramp or curb due to calf pain   Time 4   Period Weeks   Status On-going           PT Long Term Goals - 07/27/17 1632      PT LONG TERM GOAL #1   Title Patient will verbalize understanding and return demonstration for ongoing HEP to increase LE strength and balance to decrease risk of falling. (TARGET DATE: 08/26/2017)    Time 8   Period Weeks   Status On-going     PT LONG TERM GOAL #2   Title Patient's gait velocity will be >/= 2.12f/s with LRAD to indicate a decrease in risk of falling as a full community ambulator. (TARGET DATE: 08/26/2017)    Time 8   Period Weeks   Status On-going     PT LONG TERM GOAL #3   Title Patient's TUG will be </= 13s without an assistive device to indicate a decrease in her risk of falling. (TARGET DATE: 08/26/2017)    Time 8   Period Weeks   Status On-going     PT LONG TERM GOAL #4   Title PT will perform 6 Minute Walk Test, and patient will improve distance by >/= 150 ft to indicate improvement in endurance and a decrease in risk of falling. (TARGET DATE: 08/26/2017)    Baseline Initial 6-minute Walk Test = 440'   Time 8   Period Weeks   Status On-going     PT LONG TERM GOAL #5   Title Patient will demonstrate ability to ambulate 3049fincluding outdoor surfaces with LRAD and modified independence to indicate a decrease in risk of falling when ambulating in the community. (TARGET DATE: 08/26/2017)    Time 8   Period Weeks   Status On-going     PT LONG TERM GOAL #6   Title Patient will demonstrate ability to ambulate on ramp, curb and stairs (1 rail) with  modified independence to indicate a decrease in fall risk when ambulating in the community. (TARGET DATE: 08/26/2017   Time 8   Period Weeks   Status On-going     PT LONG TERM GOAL #7   Title Berg Balance Test >36/56 to indicate lower fall risk & improved ADLs.  (Target Date: 08/26/2017)   Time 8   Period Weeks   Status On-going     PT LONG TERM GOAL #8   Title Patient self reports using FOTO improved Functional Status by >10% (Initial was 40.51%).  (Target Date: 08/26/2017)   Time 8   Period Weeks   Status On-going               Plan - 07/29/17 1114    Clinical Impression Statement Pt continues to have significant calf pain limiting pt's ability to participate fully in STG assessment today.  Performed standing calf stretches which eased pain during stretch but pain returned when stretch ceased and when pt resumed ambulation.  Today, pt did not meet any STG but may benefit from re-assessment of goals once calf pain has resolved.  Pt advised on how to rest and ice calf this weekend and when to seek further medical attention.  Will continue to address and progress towards LTG.   Rehab Potential Good   Clinical Impairments Affecting Rehab Potential HTN, hyperlipemia, anxiety and  depression, asthma, obesity, sciatica, urinary incontinence, shortness of breath, osteoarthritis, low back surgery (8 years ago)        PT Frequency 2x / week   PT Duration 8 weeks   PT Treatment/Interventions ADLs/Self Care Home Management;Neuromuscular re-education;Balance training;Therapeutic exercise;Therapeutic activities;Functional mobility training;Stair training;Gait training;DME Instruction;Patient/family education;Energy conservation   PT Next Visit Plan reassess R calf pain-was worse on Friday even with stretches.  May want to re-assess LTG-did not doe well on Friday due to calf pain.  Not able to do 6 min walk test or ramp/curb.   Consulted and Agree with Plan of Care Patient      Patient will  benefit from skilled therapeutic intervention in order to improve the following deficits and impairments:  Abnormal gait, Decreased activity tolerance, Decreased balance, Decreased mobility, Decreased knowledge of use of DME, Decreased endurance, Decreased strength, Difficulty walking  Visit Diagnosis: Muscle weakness (generalized)  Unsteadiness on feet  Other abnormalities of gait and mobility  History of falling     Problem List Patient Active Problem List   Diagnosis Date Noted  . Need for influenza vaccination 01/31/2017  . Primary osteoarthritis of both knees 05/16/2016  . Sleep disorder 08/25/2015  . Reduced vision 03/19/2015  . Seasonal allergies 11/18/2014  . Unspecified vitamin D deficiency 05/13/2014  . Asthma, chronic 08/07/2013  . HTN (hypertension), benign 07/10/2012  . Vitamin D deficiency 04/10/2012  . Right leg weakness 11/10/2011  . Multiple falls 10/31/2011  . DEGENERATIVE DISC DISEASE, LUMBOSACRAL SPINE W/RADICULOPATHY 10/07/2010  . IGT (impaired glucose tolerance) 09/07/2010  . Anxiety and depression 02/24/2009  . Trigger thumb of right hand 08/22/2008  . OTHER OSTEOPOROSIS 08/22/2008  . Hyperlipemia 02/07/2008  . Morbid obesity ([place]) 02/07/2008  . SCIATICA 01/04/2008    Raylene Everts, PT, DPT 07/29/17    11:19 AM    [facility] [address] [place], [place], [address] Phone: [phone]   Fax:  [phone]  Name: [name] MRN: [id number] Date of Birth: 02/17/1934

## 2017-08-01 ENCOUNTER — Ambulatory Visit: Payer: Medicare Other | Admitting: Physical Therapy

## 2017-08-03 ENCOUNTER — Ambulatory Visit: Payer: Medicare Other | Admitting: Physical Therapy

## 2017-08-03 ENCOUNTER — Encounter: Payer: Self-pay | Admitting: Physical Therapy

## 2017-08-03 DIAGNOSIS — M6281 Muscle weakness (generalized): Secondary | ICD-10-CM

## 2017-08-03 DIAGNOSIS — R2681 Unsteadiness on feet: Secondary | ICD-10-CM | POA: Diagnosis not present

## 2017-08-03 DIAGNOSIS — Z9181 History of falling: Secondary | ICD-10-CM | POA: Diagnosis not present

## 2017-08-03 DIAGNOSIS — R2689 Other abnormalities of gait and mobility: Secondary | ICD-10-CM

## 2017-08-03 NOTE — Therapy (Signed)
[facility] [address] [place], [place], [address] Phone: [phone]   Fax:  [phone]  Physical Therapy Treatment  Patient Details  Name: [name] MRN: [id number] Date of Birth: 07-27-1934 Referring Provider: Fayrene Helper MD   Encounter Date: 08/03/2017      PT End of Session - 08/03/17 2034    Visit Number 7   Number of Visits 17   Date for PT Re-Evaluation 08/26/17   Authorization Type Medicare & G-codes every 10th visit; Tricare secondary NO PTAs    PT Start Time [phone]   PT Stop Time 0930   PT Time Calculation (min) 44 min   Activity Tolerance Patient limited by pain   Behavior During Therapy [facility] for tasks assessed/performed      Past Medical History:  Diagnosis Date  . Asthma   . Back pain   . Depression   . Family history of arthritis   . Family history of diabetes mellitus   . Family history of ischemic heart disease   . Headache(784.0)   . High blood pressure   . High cholesterol   . Hyperlipidemia   . Hypertension   . Obesity   . Pain from implanted hardware 01/28/2012  . Personal history of unspecified circulatory disease   . Sciatica   . Shortness of breath   . Urinary incontinence     Past Surgical History:  Procedure Laterality Date  . ABDOMINAL HYSTERECTOMY    . BACK SURGERY  2009   Dr. Ronnald Ramp Monson Nuerosurgeon  . COLONOSCOPY    . HARDWARE REMOVAL  01/28/2012   Procedure: HARDWARE REMOVAL;  Surgeon: Johnny Bridge, MD;  Location: [facility];  Service: Orthopedics;  Laterality: Right;  Right Knee  . right knee surgery after fracture  2006  . SPINE SURGERY      There were no vitals filed for this visit.      Subjective Assessment - 08/03/17 0850    Subjective Her calf is still hurting but not as bad. She had death in family so she has not been able to do her exercises but understands them.    Pertinent History HTN, hyperlipemia, anxiety and  depression, asthma, obesity, sciatica, urinary incontinence, shortness of breath, osteoarthritis, low back surgery (8 years ago)       Limitations Lifting;Standing;Walking;House hold activities   Patient Stated Goals walk again; be able to go the Y, church, and shopping    Currently in Pain? Yes   Pain Score 7    Pain Location Calf   Pain Orientation Right   Pain Descriptors / Indicators Burning;Other (Comment)  heat   Pain Type Acute pain   Pain Onset 1 to 4 weeks ago   Pain Frequency Intermittent   Aggravating Factors  walking especially in middle of night for toileting   Pain Relieving Factors sitting down            [facility] PT Assessment - 08/03/17 0845      Timed Up and Go Test   Normal TUG (seconds) 15.41  15.41sec with cane & 16.02sec no device                     [facility] Adult PT Treatment/Exercise - 08/03/17 0845      Ambulation/Gait   Ambulation/Gait Yes   Ambulation/Gait Assistance 5: Supervision   Ambulation/Gait Assistance Details right calf pain limiting distance. PT verbal cues on upright posture.    Ambulation  Distance (Feet) 232 Feet  232' rest, 22' during 6-Min Walk Test   Assistive device Straight cane   Gait Pattern Step-through pattern;Decreased stance time - right;Decreased dorsiflexion - left;Decreased dorsiflexion - right;Poor foot clearance - left;Poor foot clearance - right;Trunk flexed   Ambulation Surface Indoor;Level   Gait velocity 2.31 ft/sec initially, after pain onset 1.85 ft/sec     Knee/Hip Exercises: Aerobic   Stepper SciFit Stepper level 2.0 with BUEs & BLEs for 10 min with cues on pace & full ROM                PT Education - 08/03/17 0925    Education provided Yes   Education Details need for progress to continue PT;  need to exercise outside of PT to increase strength/endurance and make progress;  [employer] exercises with seated with back support using BUEs & BLEs like SciFit at low level to work on increased  time/duration first & [employer] classes with limiting # of reps and not doing exercises that does feel comfortable.    Person(s) Educated Patient   Methods Explanation;Verbal cues   Comprehension Verbalized understanding;Verbal cues required;Need further instruction          PT Short Term Goals - 08/03/17 2027      PT SHORT TERM GOAL #1   Title Patient will verbalize understanding and return demonstration for initial HEP to increase LE strength and balance to decrease risk of falling. (TARGET DATE: 07/29/2017)    Baseline MET for initial HEP   Time 4   Period Weeks   Status Achieved     PT SHORT TERM GOAL #2   Title Patient's gait velocity will be >/= 2.2f/s with LRAD to indicate a decrease in risk of falling. (TARGET DATE: 07/29/2017)    Baseline progressed with increased gait velocity to 2.31 ft/sec with cane.   Time 4   Period Weeks   Status Partially Met     PT SHORT TERM GOAL #3   Title Patient's TUG score will be </= 15s without assistive device to indicate a decrease in risk of falling. (TARGET DATE: 07/29/2017)    Baseline Partially MET with improved time but not to <15 sec level.    Time 4   Period Weeks   Status Partially Met     PT SHORT TERM GOAL #4   Title PT will perform 6 Minute Walk Test, and patient will improve distance by >/= 767fto indicate improvement in endurance. (TARGET DATE: 07/29/2017)    Baseline unable to perform due to calf pain   Time 4   Period Weeks   Status Deferred     PT SHORT TERM GOAL #5   Title Patient will demonstrate ability to ambulate 15062fith LRAD on indoor surfaces with S from PT to indicate a decrease in risk of falling. (TARGET DATE: 07/29/2017)    Baseline 115' with supervision-min A due to calf pain and catching foot on floor with LOB   Time 4   Period Weeks   Status Not Met     PT SHORT TERM GOAL #6   Title Patient will demonstrate ability to ambulate on ramp, curb, stairs (2 rails) with min guard from PT to indicate a  decrease in risk of falling. (TARGET DATE: 07/29/2017)    Baseline Stairs with min A but could not perform ramp or curb due to calf pain   Time 4   Period Weeks   Status On-going  PT Long Term Goals - 08/03/17 2032      PT LONG TERM GOAL #1   Title Patient will verbalize understanding and return demonstration for ongoing HEP to increase LE strength and balance to decrease risk of falling. (TARGET DATE: 08/26/2017)    Time 8   Period Weeks   Status On-going     PT LONG TERM GOAL #2   Title Patient's gait velocity will be >/= 2.67f/s with LRAD to indicate a decrease in risk of falling as a full community ambulator. (TARGET DATE: 08/26/2017)    Time 8   Period Weeks   Status On-going     PT LONG TERM GOAL #3   Title Patient's TUG will be </= 15s without an assistive device to indicate a decrease in her risk of falling. (TARGET DATE: 08/26/2017)    Time 8   Period Weeks   Status Revised     PT LONG TERM GOAL #4   Title PT will perform 6 Minute Walk Test, and patient will improve distance by >/= 150 ft to indicate improvement in endurance and a decrease in risk of falling. (TARGET DATE: 08/26/2017)    Baseline Initial 6-minute Walk Test = 440'   Time 8   Period Weeks   Status On-going     PT LONG TERM GOAL #5   Title Patient will demonstrate ability to ambulate 3073fincluding outdoor surfaces with LRAD and modified independence to indicate a decrease in risk of falling when ambulating in the community. (TARGET DATE: 08/26/2017)    Time 8   Period Weeks   Status On-going     PT LONG TERM GOAL #6   Title Patient will demonstrate ability to ambulate on ramp, curb and stairs (1 rail) with modified independence to indicate a decrease in fall risk when ambulating in the community. (TARGET DATE: 08/26/2017   Time 8   Period Weeks   Status On-going     PT LONG TERM GOAL #7   Title Berg Balance Test >36/56 to indicate lower fall risk & improved ADLs.  (Target Date: 08/26/2017)    Time 8   Period Weeks   Status On-going     PT LONG TERM GOAL #8   Title Patient self reports using FOTO improved Functional Status by >10% (Initial was 40.51%).  (Target Date: 08/26/2017)   Time 8   Period Weeks   Status On-going               Plan - 08/03/17 2029    Clinical Impression Statement Patient verbalizes significant family deathes & illnesses over last few months that may be increasing her depression and be a factor in pain/mobility. Patient continues to have calf pain limiting gait. She may benefit from use of rollator walker for longer distances to support back & gastroc use in gait. Patient verbalizes understanding of need for increased exercise outside of PT to improve mobility & progress.    Rehab Potential Good   Clinical Impairments Affecting Rehab Potential HTN, hyperlipemia, anxiety and depression, asthma, obesity, sciatica, urinary incontinence, shortness of breath, osteoarthritis, low back surgery (8 years ago)        PT Frequency 2x / week   PT Duration 8 weeks   PT Treatment/Interventions ADLs/Self Care Home Management;Neuromuscular re-education;Balance training;Therapeutic exercise;Therapeutic activities;Functional mobility training;Stair training;Gait training;DME Instruction;Patient/family education;Energy conservation   PT Next Visit Plan Instruct in use of rollator walker & assess if improves mobilty & pain. Work towards updated LTGs.    Consulted and  Agree with Plan of Care Patient      Patient will benefit from skilled therapeutic intervention in order to improve the following deficits and impairments:  Abnormal gait, Decreased activity tolerance, Decreased balance, Decreased mobility, Decreased knowledge of use of DME, Decreased endurance, Decreased strength, Difficulty walking  Visit Diagnosis: Muscle weakness (generalized)  Unsteadiness on feet  Other abnormalities of gait and mobility     Problem List Patient Active Problem List    Diagnosis Date Noted  . Need for influenza vaccination 01/31/2017  . Primary osteoarthritis of both knees 05/16/2016  . Sleep disorder 08/25/2015  . Reduced vision 03/19/2015  . Seasonal allergies 11/18/2014  . Unspecified vitamin D deficiency 05/13/2014  . Asthma, chronic 08/07/2013  . HTN (hypertension), benign 07/10/2012  . Vitamin D deficiency 04/10/2012  . Right leg weakness 11/10/2011  . Multiple falls 10/31/2011  . DEGENERATIVE DISC DISEASE, LUMBOSACRAL SPINE W/RADICULOPATHY 10/07/2010  . IGT (impaired glucose tolerance) 09/07/2010  . Anxiety and depression 02/24/2009  . Trigger thumb of right hand 08/22/2008  . OTHER OSTEOPOROSIS 08/22/2008  . Hyperlipemia 02/07/2008  . Morbid obesity ([place]) 02/07/2008  . SCIATICA 01/04/2008    Judea Riches PT, DPT 08/03/2017, 8:36 PM  [facility] [address] [place], [place], [address] Phone: [phone]   Fax:  [phone]  Name: DAPHENE CHISHOLM MRN: [id number] Date of Birth: 02-Mar-1934

## 2017-08-04 ENCOUNTER — Other Ambulatory Visit: Payer: Self-pay

## 2017-08-04 ENCOUNTER — Telehealth: Payer: Self-pay

## 2017-08-04 DIAGNOSIS — G479 Sleep disorder, unspecified: Secondary | ICD-10-CM

## 2017-08-04 MED ORDER — TEMAZEPAM 7.5 MG PO CAPS: 7.5000 mg | ORAL_CAPSULE | Freq: Every evening | ORAL | 2 refills | Status: DC | PRN

## 2017-08-04 NOTE — Telephone Encounter (Signed)
As not been able to find a PCP and she is out of her medication. Is needing temazepam. Was awaiting a call from you regarding any dr that you found to take her on as a patient but said she hasn't heard anything. Wants to know if she can still be seen by you until she can get in with a provider

## 2017-08-04 NOTE — Telephone Encounter (Signed)
Yes can still be seen here, Please explain , I cannot find her  Doc, ask her daughter to call her insurance for names of Doc in [place] accepting new pts and make an appt for her mother to see her, this was supposedly relayed some time ago, but please repeat to pt.she Ms Burlison can also do that Pls refill the med for 3 months, and she needs   3 month follow up appt here Print , I will sign and you send the restoril

## 2017-08-05 NOTE — Telephone Encounter (Signed)
Aware and will call back for nov appt when she consults with her daughter about transport

## 2017-08-08 ENCOUNTER — Ambulatory Visit: Payer: Medicare Other | Admitting: Physical Therapy

## 2017-08-09 ENCOUNTER — Ambulatory Visit: Payer: Medicare Other | Admitting: Physical Therapy

## 2017-08-10 ENCOUNTER — Ambulatory Visit: Payer: Medicare Other | Admitting: Physical Therapy

## 2017-08-15 ENCOUNTER — Ambulatory Visit: Payer: Medicare Other | Admitting: Physical Therapy

## 2017-08-16 ENCOUNTER — Ambulatory Visit: Payer: Medicare Other | Admitting: Physical Therapy

## 2017-08-17 ENCOUNTER — Ambulatory Visit: Payer: Medicare Other | Admitting: Physical Therapy

## 2017-08-22 ENCOUNTER — Ambulatory Visit: Payer: Medicare Other | Admitting: Physical Therapy

## 2017-08-24 ENCOUNTER — Ambulatory Visit: Payer: Medicare Other | Admitting: Physical Therapy

## 2017-08-30 ENCOUNTER — Ambulatory Visit: Payer: Medicare Other | Admitting: Physical Therapy

## 2017-09-01 ENCOUNTER — Ambulatory Visit: Payer: Medicare Other | Admitting: Physical Therapy

## 2017-10-06 ENCOUNTER — Encounter: Payer: Self-pay | Admitting: Physical Therapy

## 2017-10-06 NOTE — Therapy (Unsigned)
[facility] [address] [place], [place], [address] Phone: [phone]   Fax:  [phone]  Patient Details  Name: [name] MRN: [id number] Date of Birth: 1934/11/18 Referring Provider:   Tula Nakayama, MD  Encounter Date: 10/06/2017  PHYSICAL THERAPY DISCHARGE SUMMARY  Visits from Start of Care: 7  Current functional level related to goals / functional outcomes: Patient called 08/15/2017 to report she had too much going on and discharged herself. Patient not seen so unknown outcomes.    Remaining deficits: Unknown as patient did not return.    Education / Equipment: HEP  Plan: Patient agrees to discharge.  Patient goals were not met. Patient is being discharged due to the patient's request.  ?????         Sophronia Varney PT, DPT 10/06/2017, 12:56 PM  [facility] [address] [place], [place], [address] Phone: [phone]   Fax:  [phone]

## 2017-10-13 ENCOUNTER — Telehealth: Payer: Self-pay | Admitting: Family Medicine

## 2017-10-13 DIAGNOSIS — G479 Sleep disorder, unspecified: Secondary | ICD-10-CM

## 2017-10-13 NOTE — Telephone Encounter (Signed)
Patient is requesting nasal spray and an refill for her sleeping medication  Cb# [phone]

## 2017-10-14 MED ORDER — FLUTICASONE PROPIONATE 50 MCG/ACT NA SUSP: NASAL | 4 refills | Status: DC

## 2017-10-14 MED ORDER — TEMAZEPAM 7.5 MG PO CAPS: 7.5000 mg | ORAL_CAPSULE | Freq: Every evening | ORAL | 2 refills | Status: DC | PRN

## 2018-01-16 ENCOUNTER — Ambulatory Visit: Payer: Medicare Other | Admitting: Family Medicine

## 2018-01-20 ENCOUNTER — Telehealth: Payer: Self-pay | Admitting: Family Medicine

## 2018-01-20 DIAGNOSIS — J4520 Mild intermittent asthma, uncomplicated: Secondary | ICD-10-CM

## 2018-01-20 NOTE — Telephone Encounter (Signed)
Patient is requesting refill of nose spray and an inhaler. [facility] Cb# [phone]  *she made an appt because she fell again she said

## 2018-01-23 MED ORDER — ALBUTEROL SULFATE HFA 108 (90 BASE) MCG/ACT IN AERS: INHALATION_SPRAY | RESPIRATORY_TRACT | 3 refills | Status: DC

## 2018-01-23 MED ORDER — FLUTICASONE PROPIONATE 50 MCG/ACT NA SUSP: NASAL | 4 refills | Status: DC

## 2018-02-01 ENCOUNTER — Ambulatory Visit (INDEPENDENT_AMBULATORY_CARE_PROVIDER_SITE_OTHER): Payer: Medicare Other | Admitting: Family Medicine

## 2018-02-01 ENCOUNTER — Encounter: Payer: Self-pay | Admitting: Family Medicine

## 2018-02-01 VITALS — BP 124/82 | HR 72 | Resp 16 | Ht 64.0 in | Wt 242.0 lb

## 2018-02-01 DIAGNOSIS — E7849 Other hyperlipidemia: Secondary | ICD-10-CM | POA: Diagnosis not present

## 2018-02-01 DIAGNOSIS — Z23 Encounter for immunization: Secondary | ICD-10-CM

## 2018-02-01 DIAGNOSIS — J4520 Mild intermittent asthma, uncomplicated: Secondary | ICD-10-CM | POA: Diagnosis not present

## 2018-02-01 DIAGNOSIS — G479 Sleep disorder, unspecified: Secondary | ICD-10-CM

## 2018-02-01 DIAGNOSIS — R296 Repeated falls: Secondary | ICD-10-CM | POA: Diagnosis not present

## 2018-02-01 DIAGNOSIS — J302 Other seasonal allergic rhinitis: Secondary | ICD-10-CM | POA: Diagnosis not present

## 2018-02-01 LAB — LIPID PANEL
Cholesterol: 204 mg/dL — ABNORMAL HIGH (ref <200)
HDL: 62 mg/dL (ref >50)
LDL CHOLESTEROL (CALC): 117 mg/dL — AB
Non-HDL Cholesterol (Calc): 142 mg/dL (calc) — ABNORMAL HIGH (ref <130)
Total CHOL/HDL Ratio: 3.3 (calc) (ref <5.0)
Triglycerides: 136 mg/dL (ref <150)

## 2018-02-01 LAB — COMPLETE METABOLIC PANEL WITH GFR
AG RATIO: 1.4 (calc) (ref 1.0–2.5)
ALBUMIN MSPROF: 4.3 g/dL (ref 3.6–5.1)
ALT: 21 U/L (ref 6–29)
AST: 18 U/L (ref 10–35)
Alkaline phosphatase (APISO): 67 U/L (ref 33–130)
BILIRUBIN TOTAL: 0.7 mg/dL (ref 0.2–1.2)
BUN: 13 mg/dL (ref 7–25)
CHLORIDE: 105 mmol/L (ref 98–110)
CO2: 27 mmol/L (ref 20–32)
Calcium: 9.5 mg/dL (ref 8.6–10.4)
Creat: 0.67 mg/dL (ref 0.60–0.88)
GFR, Est African American: 96 mL/min/{1.73_m2} (ref >=60)
GFR, Est Non African American: 82 mL/min/{1.73_m2} (ref >=60)
GLOBULIN: 3.1 g/dL (ref 1.9–3.7)
Glucose, Bld: 98 mg/dL (ref 65–99)
POTASSIUM: 4.2 mmol/L (ref 3.5–5.3)
SODIUM: 141 mmol/L (ref 135–146)
Total Protein: 7.4 g/dL (ref 6.1–8.1)

## 2018-02-01 MED ORDER — PRAVASTATIN SODIUM 10 MG PO TABS: 10.0000 mg | ORAL_TABLET | Freq: Every day | ORAL | 5 refills | Status: DC

## 2018-02-01 NOTE — Progress Notes (Signed)
   [name]     MRN: [id number]      DOB: June 16, 1934   HPI [name] is here for follow up and re-evaluation of chronic medical conditions, medication management and review of any available recent lab and radiology data.  States she does not feel well at all. Very unsteady , fell last month in the bathroom, and ;last month on the porch, has steps going up to the porch but lives on one level has difficulty maneuvering the steps and wants alternative housing Appetite is reduced, gets full more easily, cutting back on sweets and starchy foods in an attempt to lose weight Had episode of left chest discomfort with light headedness recently while sitting  ROS Denies recent fever or chills. Denies sinus pressure, nasal congestion, ear pain or sore throat. Denies chest congestion, productive cough or uncontrolled wheezing. Denies, palpitations and leg swelling Denies abdominal pain, nausea, vomiting,diarrhea or constipation.   Denies dysuria, frequency, hesitancy or incontinence. . Denies headaches, seizures, numbness, or tingling. Denies depression, anxiety or insomnia. Denies skin break down or rash.   PE  BP 124/82   Pulse 72   Resp 16   Ht 5\' 4"  (1.626 m)   Wt 242 lb (109.8 kg)   SpO2 94%   BMI 41.54 kg/m   Patient alert and oriented and in no cardiopulmonary distress.  HEENT: No facial asymmetry, EOMI,   oropharynx pink and moist.  Neck supple no JVD, no mass.  Chest: Clear to auscultation bilaterally.  CVS: S1, S2 no murmurs, no S3.Regular rate.  ABD: Soft non tender.   Ext: No edema  MS: Decreased  ROM spine, shoulders, hips and knees.  Skin: Intact, no ulcerations or rash noted.  Psych: Good eye contact, normal affect. Memory intact not anxious or depressed appearing.  CNS: CN 2-12 intact, power,  normal throughout.no focal deficits noted.   Assessment & Plan  Hyperlipemia Hyperlipidemia:Low fat diet discussed and encouraged.   Lipid Panel  Lab  Results  Component Value Date   CHOL 204 (H) 02/01/2018   HDL 62 02/01/2018   LDLCALC 120 (H) 06/01/2017   TRIG 136 02/01/2018   CHOLHDL 3.3 02/01/2018    Elevated above goal , has been out of medication, same is renewed also needs to follow low fat diet  Morbid obesity Slight improvement, pt applauded on this Patient re-educated about  the importance of commitment to regular physical activity , as able. The importance of healthy food choices with portion control discussed.   Weight /BMI 02/01/2018 06/08/2017 06/01/2017  WEIGHT 242 lb 249 lb 239 lb 1.9 oz  HEIGHT 5\' 4"  5\' 4"  5\' 4"   BMI 41.54 kg/m2 42.74 kg/m2 41.04 kg/m2      Multiple falls Reports repeated falls , needs in home physical therapy twice weekly for 6 weeks, also needs case worker / [facility] consult to look at alternative housing where she does not have steps ill reach out to [employer] sahe is in [place] and a good contact  Need for influenza vaccination After obtaining informed consent, the vaccine is  administered by LPN.   Sleep disorder Sleep hygiene reviewed and written information offered also. Prescription sent for  medication needed.   Asthma, chronic Stable and controlled   Seasonal allergies Unchanged , chronic periorbital swelling with clear nasal drainage and cough and sneeze

## 2018-02-01 NOTE — Patient Instructions (Addendum)
F/u In 4.5 months with rectal exam, call if you need before  Lipid, cmp and EGFr today  We are asking social worker with [facility] to see you and assess help with  Safer housing , no steps s also in home physical therapy for repeated fallls   We will refill medication for 5 months    Flu vaccine today   Take vitamin D3 2000 IU one daily  Take calcium with D  600 mg with vit D one daily  Thank you  for choosing [facility]. We consider it a privelige to serve you.  Delivering excellent health care in a caring and  compassionate way is our goal.  Partnering with you,  so that together we can achieve this goal is our strategy.

## 2018-02-02 ENCOUNTER — Encounter: Payer: Self-pay | Admitting: Family Medicine

## 2018-02-04 ENCOUNTER — Encounter: Payer: Self-pay | Admitting: Family Medicine

## 2018-02-04 NOTE — Assessment & Plan Note (Signed)
Stable and controlled

## 2018-02-04 NOTE — Assessment & Plan Note (Signed)
Hyperlipidemia:Low fat diet discussed and encouraged.   Lipid Panel  Lab Results  Component Value Date   CHOL 204 (H) 02/01/2018   HDL 62 02/01/2018   LDLCALC 120 (H) 06/01/2017   TRIG 136 02/01/2018   CHOLHDL 3.3 02/01/2018    Elevated above goal , has been out of medication, same is renewed also needs to follow low fat diet

## 2018-02-04 NOTE — Assessment & Plan Note (Addendum)
Reports repeated falls , needs in home physical therapy twice weekly for 6 weeks, also needs case worker / [facility] consult to look at alternative housing where she does not have steps ill reach out to [place] is in [place] and a good contact

## 2018-02-04 NOTE — Assessment & Plan Note (Signed)
Unchanged , chronic periorbital swelling with clear nasal drainage and cough and sneeze

## 2018-02-04 NOTE — Assessment & Plan Note (Signed)
After obtaining informed consent, the vaccine is  administered by LPN.  

## 2018-02-04 NOTE — Assessment & Plan Note (Signed)
Slight improvement, pt applauded on this Patient re-educated about  the importance of commitment to regular physical activity , as able. The importance of healthy food choices with portion control discussed.   Weight /BMI 02/01/2018 06/08/2017 06/01/2017  WEIGHT 242 lb 249 lb 239 lb 1.9 oz  HEIGHT 5\' 4"  5\' 4"  5\' 4"   BMI 41.54 kg/m2 42.74 kg/m2 41.04 kg/m2

## 2018-02-04 NOTE — Assessment & Plan Note (Signed)
Sleep hygiene reviewed and written information offered also. Prescription sent for  medication needed.  

## 2018-02-08 ENCOUNTER — Telehealth: Payer: Self-pay

## 2018-02-08 DIAGNOSIS — R296 Repeated falls: Secondary | ICD-10-CM

## 2018-02-08 NOTE — Telephone Encounter (Signed)
-----   Message from Fayrene Helper, MD sent at 02/04/2018  6:42 PM EST ----- Regarding: pls refer to [facility] I am sending Janalyn Shy a message  Would like to use her if possible so I will ask if she can take her on.. Pt requests different housing does not need place with steps , has steps into her house also pls refer for twice weekly in home pT  For 6 weeks due t recurrent falls and unsteady gait  ?? pls ask. Thanks

## 2018-02-09 ENCOUNTER — Telehealth: Payer: Self-pay | Admitting: Family Medicine

## 2018-02-09 ENCOUNTER — Other Ambulatory Visit: Payer: Self-pay

## 2018-02-09 NOTE — Patient Outreach (Addendum)
[facility]) Care Management  02/09/2018  [name] 1934/07/02 [id number]   Telephone Screen  Referral Date: 02/08/18 Referral Source: MD office Referral Reason: "falls, needs help with safer housing" Insurance: Medicare   Outreach attempt # 1 to patient. Spoke with patient and screening completed. Patient with phone issues and call disconnected multiple times.   Social: She voices that she has two granddtrs that stay with her. She reports she moved a few years ago from [place] to [place] to be closer to her dtr. She voices that she is fairly independent with ADLs/IADLs. She does not drive and relies on her dtr to take her to appts. Patient states that it is difficult for her to make appts at times due to dtr's work schedule. She is interested in alternative transportation resources. She voices that she had two falls within the past three months with no injuries. DME in the home include cane,walker, rollator, shower chair, raised commode. She voices that she does not use any DME inside the home as she is able to get around okay. She reports that when she goes out she normally takes her cane with her. Patient states that she needs to relocate and find alternative housing. She voices that her home has "too many steep steps" which has been the cause of her falls. She voices she is interested in moving to an apartment or rental house just as long as there are no steps.   Conditions: Patient has PMH of sleep disorder, asthma, seasonal allergies and HLD. She voices that she is managing her conditions without any problems and does not feel like she needs further education and support. She does report that she is getting a little more forgetful but she mentioned this to MD and she thinks it is more related to age. Patient inquiring about physical therapy to help her be able to walk and get around better. Noted in MD note that MD recommended HHPT. Advised patient of this and  patient aware that [facility] does provide these services.   Medications: Patient reports taking four meds. She denies any issues managing/affording meds at this time.   Appointments: Patient saw PCP on 02/01/18. She states that she goes back to see PCP in June and sees no other MD's at this time.    Advance Directives: None. Patient interested in further info and completing form.   Consent: [facility] services reviewed and discussed with patient. Verbal consent for SW assistance given. Patient voices that she does not feel like she needs nursing support at this time and will call office if she changes her mind.   Plan: RN CM will notify [facility] administrative assistant of case status. RN CM will send [facility] SW referral for housing, transportation and advance directives assistance. RN CM will contact PCP office and left voicemail message advising that [facility] does not provide HHPT services.  Enzo Montgomery, RN,BSN,CCM [facility] Management Telephonic Care Management Coordinator Direct Phone: [phone] Toll Free: [phone] Fax: [phone]

## 2018-02-09 NOTE — Patient Outreach (Signed)
[facility]) Care Management  02/09/2018  [name] 1934-07-03 [id number]   Care Coordination      RN CM contacted patient's PCP office. Voicemail message left advising staff that [facility] does not provide HHPT services.     Enzo Montgomery, RN,BSN,CCM [facility] Management Telephonic Care Management Coordinator Direct Phone: [phone] Toll Free: [phone] Fax: [phone]

## 2018-02-09 NOTE — Telephone Encounter (Signed)
[place] with [facility] Calling to let you know they do not provide physical therapy services. Cb#: [phone]

## 2018-02-10 DIAGNOSIS — J4520 Mild intermittent asthma, uncomplicated: Secondary | ICD-10-CM | POA: Diagnosis not present

## 2018-02-10 DIAGNOSIS — R269 Unspecified abnormalities of gait and mobility: Secondary | ICD-10-CM | POA: Diagnosis not present

## 2018-02-10 DIAGNOSIS — E785 Hyperlipidemia, unspecified: Secondary | ICD-10-CM | POA: Diagnosis not present

## 2018-02-10 DIAGNOSIS — R531 Weakness: Secondary | ICD-10-CM | POA: Diagnosis not present

## 2018-02-10 DIAGNOSIS — Z9181 History of falling: Secondary | ICD-10-CM | POA: Diagnosis not present

## 2018-02-10 DIAGNOSIS — R296 Repeated falls: Secondary | ICD-10-CM | POA: Diagnosis not present

## 2018-02-14 ENCOUNTER — Encounter: Payer: Self-pay | Admitting: *Deleted

## 2018-02-14 ENCOUNTER — Other Ambulatory Visit: Payer: Self-pay | Admitting: *Deleted

## 2018-02-14 NOTE — Patient Outreach (Signed)
[facility]) Care Management  02/14/2018  [name] Jun 05, 1934 [id number]   CSW was able to make initial contact with patient today to perform phone assessment, as well as assess and assist with social work needs and services.  CSW introduced self, explained role and types of services provided through [place] [employer] ([place] [employer]).  CSW further explained to patient that CSW works with patient's RNCM, also with [place] [employer], Kandra Nicolas Florance. CSW then explained the reason for the call, indicating that Mrs. Florance thought that patient would benefit from social work services and resources to assist with making alternate housing arrangements, assisting with transportation to and from physician appointments and completion of Advanced Directives (Living Will and [employer] Power of [place] documents).  CSW obtained two HIPAA compliant identifiers from patient, which included patient's name and date of birth. Patient admitted that she needs all of the following services; therefore, CSW and patient agreed to meet for an initial home visit to thoroughly cover all topics.  The initial home visit will take place on Friday, February 17, 2018 at 11:00 AM.  CSW has agreed to bring all of the following resource information with her to the initial home visit: [employer] manager, fast food for Treatment form List of Resources for Scientist, clinical (histocompatibility and immunogenetics) for Older Adults in [place], [place] (Engineering geologist) Retail banker for [place] List of [place] for [place] ([place] documents) MOST (Medical Orders for Scope of Treatment) form THN CM Care Plan Problem One     Most Recent Value  Care Plan  Problem One  No Advanced Directives in place.  Role Documenting the Problem One  Clinical Social Worker  Care Plan for Problem One  Active  [facility] Long Term Goal   Patient will have Living Will and [place] documents in place within the next 45 days.  THN Long Term Goal Start Date  02/14/18  Interventions for Problem One Long Term Goal  CSW will meet with patient for an initial home visit to assist with completion of documents.  THN CM Short Term Goal #1   Patient will take completed documents to a [place] affiliated facility to have them notarized, within the next 30 days.  THN CM Short Term Goal #1 Start Date  02/14/18  Interventions for Short Term Goal #1  CSW will provide patient with a list of [place] affilitated facilities that offer notary services free of charge.    THN CM Care Plan Problem Two     Most Recent Value  Care Plan Problem Two  Lack of transportation to and from physician appointments.  Role Documenting the Problem Two  Clinical Social Worker  Care Plan for Problem Two  Active  THN CM Short Term Goal #1   Patient will utilize SCAT and/or Armed forces technical officer through [employer] of [place] to get to and from physician appointments, within the next 30 days.  THN CM Short Term Goal #1 Start Date  02/14/18  Interventions for Short Term Goal #2   CSW will complete a SCAT application on patient's behave and submit for processing.    THN CM Care Plan Problem Three     Most Recent Value  Care Plan Problem Three  Patient is in need of alternate housing arrangements.  Role  Documenting the Problem Three  Clinical Social Worker  Care Plan for Problem Three  Active  THN CM Short Term Goal #1   Patient will review the list of housing resources provided to her by CSW and complete necessary applications, within the next 30 days.  THN CM Short Term Goal #1 Start Date  02/14/18  Interventions for Short Term Goal #1  CSW will provide patient with a list of  housing resources for seniors.     [name], BSW, MSW, LCSW  Licensed Education officer, environmental Health System  Mailing [place] [address], [place], [place] [address] Physical Address-[address], [place], [place] [address] Toll Free Main # [phone] Fax # [phone] Cell # [phone]  Office # [phone] [email or web address]

## 2018-02-14 NOTE — Telephone Encounter (Signed)
She was referred to home health for the PT

## 2018-02-15 DIAGNOSIS — R531 Weakness: Secondary | ICD-10-CM | POA: Diagnosis not present

## 2018-02-15 DIAGNOSIS — E785 Hyperlipidemia, unspecified: Secondary | ICD-10-CM | POA: Diagnosis not present

## 2018-02-15 DIAGNOSIS — R296 Repeated falls: Secondary | ICD-10-CM | POA: Diagnosis not present

## 2018-02-15 DIAGNOSIS — R269 Unspecified abnormalities of gait and mobility: Secondary | ICD-10-CM | POA: Diagnosis not present

## 2018-02-15 DIAGNOSIS — J4520 Mild intermittent asthma, uncomplicated: Secondary | ICD-10-CM | POA: Diagnosis not present

## 2018-02-17 ENCOUNTER — Other Ambulatory Visit: Payer: Self-pay | Admitting: *Deleted

## 2018-02-17 ENCOUNTER — Ambulatory Visit: Payer: Self-pay | Admitting: *Deleted

## 2018-02-17 DIAGNOSIS — R531 Weakness: Secondary | ICD-10-CM | POA: Diagnosis not present

## 2018-02-17 DIAGNOSIS — E785 Hyperlipidemia, unspecified: Secondary | ICD-10-CM | POA: Diagnosis not present

## 2018-02-17 DIAGNOSIS — J4520 Mild intermittent asthma, uncomplicated: Secondary | ICD-10-CM | POA: Diagnosis not present

## 2018-02-17 DIAGNOSIS — R296 Repeated falls: Secondary | ICD-10-CM | POA: Diagnosis not present

## 2018-02-17 DIAGNOSIS — R269 Unspecified abnormalities of gait and mobility: Secondary | ICD-10-CM | POA: Diagnosis not present

## 2018-02-17 NOTE — Patient Outreach (Addendum)
[facility]) Care Management  02/17/2018  [name] [birth year] [id number]  CSW was able to meet with patient today to perform the initial home visit, as well as provide patient with a packet of resource information.  During the initial home visit, CSW reviewed the list of transportation agencies that are able to offer patient transportation assistance, due to age and other eligibility criteria.  These agencies include [facility], as well as Armed forces technical officer through [employer] of [place].  In addition, CSW spoke with patient at length about transportation assistance through [employer] agreeing to complete the application and submit for processing, on patient's behalf.  Patient voiced understanding and was agreeable to this plan.  Patient is aware that she will be receiving a call from a representative with [employer] within the next two weeks to complete a phone assessment. CSW was also able to assist patient with completion of her Advanced Directives ([place] documents).  Patient has been given a list of [place] affiliated facilities where she can take the documents to have them notarized.  Once the documents are signed, witnessed and notarized, patient has been encouraged to provide her Primary Care Physician, Dr. Tula [name] with a copy to scan into Epic (Electronic Medical Record).  Patient was also given a MOST (Medical Orders for Scope of Treatment) form and encouraged to discuss with Dr. Moshe [name] during her next scheduled office visit. Last, CSW reviewed with patient a list of housing resources, as patient reports that she needs to move into a place of residence that is more handicapped accessible.  Patient mentioned that she has fallen up, and down, her front set of stairs on numerous occasions.  Patient talked about how lucky she has been to have not broken any bones or  sustained serious injury.  Patient indicated that she plans to review the list of housing resources with her daughter to receive assistance with relocating.  Patient currently has a granddaughter and two great grandsons living with her that she will need to accommodate for, as well.  CSW agreed to follow-up with patient next week to answer any question she may have pertaining to the packet of resource information provided today, as well as assess need for continued social work involvement. THN CM Care Plan Problem One     Most Recent Value  Care Plan Problem One  No Advanced Directives in place.  Role Documenting the Problem One  Clinical Social Worker  Care Plan for Problem One  Active  [facility] Long Term Goal   Patient will have Living Will and [place] documents in place within the next 45 days.  THN Long Term Goal Start Date  02/14/18  Interventions for Problem One Long Term Goal  CSW will meet with patient for an initial home visit to assist with completion of documents.  THN CM Short Term Goal #1   Patient will take completed documents to a [place] affiliated facility to have them notarized, within the next 30 days.  THN CM Short Term Goal #1 Start Date  02/14/18  Interventions for Short Term Goal #1  CSW will provide patient with a list of [place] affilitated facilities that offer notary services free of charge.    THN CM Care Plan Problem Two     Most Recent Value  Care Plan Problem Two  Lack of transportation to and from physician appointments.  Role Documenting the  Problem Two  Clinical Social Worker  Care Plan for Problem Two  Active  THN CM Short Term Goal #1   Patient will utilize SCAT and/or Armed forces technical officer through [employer] of [place] to get to and from physician appointments, within the next 30 days.  THN CM Short Term Goal #1 Start Date  02/14/18  Interventions for Short Term Goal #2   CSW will complete a SCAT application on patient's behave  and submit for processing.    THN CM Care Plan Problem Three     Most Recent Value  Care Plan Problem Three  Patient is in need of alternate housing arrangements.  Role Documenting the Problem Three  Clinical Social Worker  Care Plan for Problem Three  Active  [facility] CM Short Term Goal #1   Patient will review the list of housing resources provided to her by CSW and complete necessary applications, within the next 30 days.  THN CM Short Term Goal #1 Start Date  02/14/18  [facility] CM Short Term Goal #1 Met Date  02/17/18  Interventions for Short Term Goal #1  CSW will provide patient with a list of housing resources for seniors.     [name], BSW, MSW, LCSW  Licensed Education officer, environmental Health System  Mailing [place] [address], [place], [place] [address] Physical Address-[address], [place], [place] [address] Toll Free Main # [phone] Fax # [phone] Cell # [phone]  Office # [phone] [email or web address]

## 2018-02-20 NOTE — Patient Outreach (Signed)
Request received from Joanna Saporito, LCSW to mail patient personal care resources.  Information mailed today. 

## 2018-02-22 DIAGNOSIS — R269 Unspecified abnormalities of gait and mobility: Secondary | ICD-10-CM | POA: Diagnosis not present

## 2018-02-22 DIAGNOSIS — E785 Hyperlipidemia, unspecified: Secondary | ICD-10-CM | POA: Diagnosis not present

## 2018-02-22 DIAGNOSIS — R296 Repeated falls: Secondary | ICD-10-CM | POA: Diagnosis not present

## 2018-02-22 DIAGNOSIS — R531 Weakness: Secondary | ICD-10-CM | POA: Diagnosis not present

## 2018-02-22 DIAGNOSIS — J4520 Mild intermittent asthma, uncomplicated: Secondary | ICD-10-CM | POA: Diagnosis not present

## 2018-02-24 ENCOUNTER — Other Ambulatory Visit: Payer: Self-pay | Admitting: *Deleted

## 2018-02-24 ENCOUNTER — Encounter: Payer: Self-pay | Admitting: *Deleted

## 2018-02-24 NOTE — Patient Outreach (Signed)
[facility]) Care Management  02/24/2018  [name] May 26, 1934 [id number]   CSW was able to make contact with patient today to follow-up regarding social work services and resources, as well as ensure that patient received the list of resource information mailed to her home by CSW.  Patient reported that she has already begun reviewing the list of housing resources and plans to start touring facilities within the next few months.  Patient denied having heard from a representative with [employer] Paramedic) regarding recent Chief Operating Officer.  CSW reminded patient that [employer] is currently "back-logged" and that it may take several weeks for her to receive a call from a representative with regards to performing her phone assessment.  CSW provided patient with the contact information for [employer], encouraging patient to contact them periodically to check the status of her application.  Patient voiced understanding and was agreeable to this plan. CSW will perform a case closure on patient, as all goals of treatment have been met from social work standpoint and no additional social work needs have been identified at this time.  CSW will fax an update to patient's Primary Care Physician, Dr. Tula Nakayama to ensure that they are aware of CSW's involvement with patient's plan of care.  CSW will submit a case closure request to Alycia Rossetti, Care Management Assistant with [place] [facility], in the form of an [employer].   THN CM Care Plan Problem One     Most Recent Value  Care Plan Problem One  No Advanced Directives in place.  Role Documenting the Problem One  Clinical Social Worker  Care Plan for Problem One  Active  [facility] Long Term Goal   Patient will have Living Will and [place] documents in place within the next 45 days.  THN Long Term Goal Start Date  02/14/18  THN Long Term Goal Met Date  02/24/18   Interventions for Problem One Long Term Goal  CSW will meet with patient for an initial home visit to assist with completion of documents.  THN CM Short Term Goal #1   Patient will take completed documents to a [place] affiliated facility to have them notarized, within the next 30 days.  THN CM Short Term Goal #1 Start Date  02/14/18  [facility] CM Short Term Goal #1 Met Date  02/24/18  Interventions for Short Term Goal #1  CSW will provide patient with a list of [place] affilitated facilities that offer notary services free of charge.    THN CM Care Plan Problem Two     Most Recent Value  Care Plan Problem Two  Lack of transportation to and from physician appointments.  Role Documenting the Problem Two  Clinical Social Worker  Care Plan for Problem Two  Active  THN CM Short Term Goal #1   Patient will utilize SCAT and/or Armed forces technical officer through [employer] of [place] to get to and from physician appointments, within the next 30 days.  THN CM Short Term Goal #1 Start Date  02/14/18  [facility] CM Short Term Goal #1 Met Date   02/24/18  Interventions for Short Term Goal #2   CSW will complete a SCAT application on patient's behave and submit for processing.    THN CM Care Plan Problem Three     Most Recent Value  Care Plan Problem Three  Patient is in need of alternate housing arrangements.  Role Documenting the Problem  [place] for Problem Three  Active  THN CM Short Term Goal #1   Patient will review the list of housing resources provided to her by CSW and complete necessary applications, within the next 30 days.  THN CM Short Term Goal #1 Start Date  02/14/18  [facility] CM Short Term Goal #1 Met Date  02/17/18  Interventions for Short Term Goal #1  CSW will provide patient with a list of housing resources for seniors.    [name], BSW, MSW, LCSW  Licensed Education officer, environmental Health System   Mailing [place] [address] [place], [place] [address] Physical Address-[address], [place], [place] [address] Toll Free Main # [phone] Fax # [phone] Cell # [phone]  Office # [phone] [email or web address]

## 2018-02-28 DIAGNOSIS — R296 Repeated falls: Secondary | ICD-10-CM | POA: Diagnosis not present

## 2018-02-28 DIAGNOSIS — E785 Hyperlipidemia, unspecified: Secondary | ICD-10-CM | POA: Diagnosis not present

## 2018-02-28 DIAGNOSIS — J4520 Mild intermittent asthma, uncomplicated: Secondary | ICD-10-CM | POA: Diagnosis not present

## 2018-02-28 DIAGNOSIS — R531 Weakness: Secondary | ICD-10-CM | POA: Diagnosis not present

## 2018-02-28 DIAGNOSIS — R269 Unspecified abnormalities of gait and mobility: Secondary | ICD-10-CM | POA: Diagnosis not present

## 2018-03-03 ENCOUNTER — Ambulatory Visit: Payer: Medicare Other | Admitting: *Deleted

## 2018-03-07 DIAGNOSIS — R269 Unspecified abnormalities of gait and mobility: Secondary | ICD-10-CM | POA: Diagnosis not present

## 2018-03-07 DIAGNOSIS — J4520 Mild intermittent asthma, uncomplicated: Secondary | ICD-10-CM | POA: Diagnosis not present

## 2018-03-07 DIAGNOSIS — R531 Weakness: Secondary | ICD-10-CM | POA: Diagnosis not present

## 2018-03-07 DIAGNOSIS — R296 Repeated falls: Secondary | ICD-10-CM | POA: Diagnosis not present

## 2018-03-07 DIAGNOSIS — E785 Hyperlipidemia, unspecified: Secondary | ICD-10-CM | POA: Diagnosis not present

## 2018-03-09 DIAGNOSIS — R269 Unspecified abnormalities of gait and mobility: Secondary | ICD-10-CM | POA: Diagnosis not present

## 2018-03-09 DIAGNOSIS — R296 Repeated falls: Secondary | ICD-10-CM | POA: Diagnosis not present

## 2018-03-09 DIAGNOSIS — E785 Hyperlipidemia, unspecified: Secondary | ICD-10-CM | POA: Diagnosis not present

## 2018-03-09 DIAGNOSIS — J4520 Mild intermittent asthma, uncomplicated: Secondary | ICD-10-CM | POA: Diagnosis not present

## 2018-03-09 DIAGNOSIS — R531 Weakness: Secondary | ICD-10-CM | POA: Diagnosis not present

## 2018-03-14 DIAGNOSIS — R269 Unspecified abnormalities of gait and mobility: Secondary | ICD-10-CM | POA: Diagnosis not present

## 2018-03-14 DIAGNOSIS — E785 Hyperlipidemia, unspecified: Secondary | ICD-10-CM | POA: Diagnosis not present

## 2018-03-14 DIAGNOSIS — R296 Repeated falls: Secondary | ICD-10-CM | POA: Diagnosis not present

## 2018-03-14 DIAGNOSIS — R531 Weakness: Secondary | ICD-10-CM | POA: Diagnosis not present

## 2018-03-14 DIAGNOSIS — J4520 Mild intermittent asthma, uncomplicated: Secondary | ICD-10-CM | POA: Diagnosis not present

## 2018-03-16 DIAGNOSIS — R269 Unspecified abnormalities of gait and mobility: Secondary | ICD-10-CM | POA: Diagnosis not present

## 2018-03-16 DIAGNOSIS — R296 Repeated falls: Secondary | ICD-10-CM | POA: Diagnosis not present

## 2018-03-16 DIAGNOSIS — J4520 Mild intermittent asthma, uncomplicated: Secondary | ICD-10-CM | POA: Diagnosis not present

## 2018-03-16 DIAGNOSIS — E785 Hyperlipidemia, unspecified: Secondary | ICD-10-CM | POA: Diagnosis not present

## 2018-03-16 DIAGNOSIS — R531 Weakness: Secondary | ICD-10-CM | POA: Diagnosis not present

## 2018-03-21 DIAGNOSIS — E785 Hyperlipidemia, unspecified: Secondary | ICD-10-CM | POA: Diagnosis not present

## 2018-03-21 DIAGNOSIS — R296 Repeated falls: Secondary | ICD-10-CM | POA: Diagnosis not present

## 2018-03-21 DIAGNOSIS — J4520 Mild intermittent asthma, uncomplicated: Secondary | ICD-10-CM | POA: Diagnosis not present

## 2018-03-21 DIAGNOSIS — R269 Unspecified abnormalities of gait and mobility: Secondary | ICD-10-CM | POA: Diagnosis not present

## 2018-03-21 DIAGNOSIS — R531 Weakness: Secondary | ICD-10-CM | POA: Diagnosis not present

## 2018-03-23 DIAGNOSIS — R531 Weakness: Secondary | ICD-10-CM | POA: Diagnosis not present

## 2018-03-23 DIAGNOSIS — R296 Repeated falls: Secondary | ICD-10-CM | POA: Diagnosis not present

## 2018-03-23 DIAGNOSIS — J4520 Mild intermittent asthma, uncomplicated: Secondary | ICD-10-CM | POA: Diagnosis not present

## 2018-03-23 DIAGNOSIS — R269 Unspecified abnormalities of gait and mobility: Secondary | ICD-10-CM | POA: Diagnosis not present

## 2018-03-23 DIAGNOSIS — E785 Hyperlipidemia, unspecified: Secondary | ICD-10-CM | POA: Diagnosis not present

## 2018-03-28 DIAGNOSIS — R269 Unspecified abnormalities of gait and mobility: Secondary | ICD-10-CM | POA: Diagnosis not present

## 2018-03-28 DIAGNOSIS — R531 Weakness: Secondary | ICD-10-CM | POA: Diagnosis not present

## 2018-03-28 DIAGNOSIS — E785 Hyperlipidemia, unspecified: Secondary | ICD-10-CM | POA: Diagnosis not present

## 2018-03-28 DIAGNOSIS — J4520 Mild intermittent asthma, uncomplicated: Secondary | ICD-10-CM | POA: Diagnosis not present

## 2018-03-28 DIAGNOSIS — R296 Repeated falls: Secondary | ICD-10-CM | POA: Diagnosis not present

## 2018-04-05 DIAGNOSIS — R296 Repeated falls: Secondary | ICD-10-CM | POA: Diagnosis not present

## 2018-04-05 DIAGNOSIS — R531 Weakness: Secondary | ICD-10-CM | POA: Diagnosis not present

## 2018-04-05 DIAGNOSIS — E785 Hyperlipidemia, unspecified: Secondary | ICD-10-CM | POA: Diagnosis not present

## 2018-04-05 DIAGNOSIS — R269 Unspecified abnormalities of gait and mobility: Secondary | ICD-10-CM | POA: Diagnosis not present

## 2018-04-05 DIAGNOSIS — J4520 Mild intermittent asthma, uncomplicated: Secondary | ICD-10-CM | POA: Diagnosis not present

## 2018-04-06 ENCOUNTER — Telehealth: Payer: Self-pay | Admitting: Family Medicine

## 2018-04-06 DIAGNOSIS — J4520 Mild intermittent asthma, uncomplicated: Secondary | ICD-10-CM | POA: Diagnosis not present

## 2018-04-06 DIAGNOSIS — E785 Hyperlipidemia, unspecified: Secondary | ICD-10-CM | POA: Diagnosis not present

## 2018-04-06 DIAGNOSIS — R296 Repeated falls: Secondary | ICD-10-CM | POA: Diagnosis not present

## 2018-04-06 DIAGNOSIS — R269 Unspecified abnormalities of gait and mobility: Secondary | ICD-10-CM | POA: Diagnosis not present

## 2018-04-06 DIAGNOSIS — R531 Weakness: Secondary | ICD-10-CM | POA: Diagnosis not present

## 2018-04-06 NOTE — Telephone Encounter (Signed)
Benita, Physical Therapist with [facility], left message on nurse line stating that patient is making progress in therapy and she is wanting to extend treatment. She is asking for verbal orders to continue therapy at 2x/5-6weeks to work on outdoor ambulation on uneven surfaces, and stair training.   Callback# [phone]

## 2018-04-06 NOTE — Telephone Encounter (Signed)
Spoke with [employer] and gave verbal order. She will fax necessary paperwork.

## 2018-04-11 DIAGNOSIS — R269 Unspecified abnormalities of gait and mobility: Secondary | ICD-10-CM | POA: Diagnosis not present

## 2018-04-11 DIAGNOSIS — R531 Weakness: Secondary | ICD-10-CM | POA: Diagnosis not present

## 2018-04-11 DIAGNOSIS — Z9181 History of falling: Secondary | ICD-10-CM | POA: Diagnosis not present

## 2018-04-11 DIAGNOSIS — R296 Repeated falls: Secondary | ICD-10-CM | POA: Diagnosis not present

## 2018-04-11 DIAGNOSIS — E785 Hyperlipidemia, unspecified: Secondary | ICD-10-CM | POA: Diagnosis not present

## 2018-04-11 DIAGNOSIS — J4520 Mild intermittent asthma, uncomplicated: Secondary | ICD-10-CM | POA: Diagnosis not present

## 2018-04-14 DIAGNOSIS — H02402 Unspecified ptosis of left eyelid: Secondary | ICD-10-CM | POA: Diagnosis not present

## 2018-04-14 DIAGNOSIS — Z961 Presence of intraocular lens: Secondary | ICD-10-CM | POA: Diagnosis not present

## 2018-04-18 DIAGNOSIS — J4520 Mild intermittent asthma, uncomplicated: Secondary | ICD-10-CM | POA: Diagnosis not present

## 2018-04-18 DIAGNOSIS — R296 Repeated falls: Secondary | ICD-10-CM | POA: Diagnosis not present

## 2018-04-18 DIAGNOSIS — E785 Hyperlipidemia, unspecified: Secondary | ICD-10-CM | POA: Diagnosis not present

## 2018-04-18 DIAGNOSIS — R269 Unspecified abnormalities of gait and mobility: Secondary | ICD-10-CM | POA: Diagnosis not present

## 2018-04-18 DIAGNOSIS — R531 Weakness: Secondary | ICD-10-CM | POA: Diagnosis not present

## 2018-04-20 DIAGNOSIS — R269 Unspecified abnormalities of gait and mobility: Secondary | ICD-10-CM | POA: Diagnosis not present

## 2018-04-20 DIAGNOSIS — J4520 Mild intermittent asthma, uncomplicated: Secondary | ICD-10-CM | POA: Diagnosis not present

## 2018-04-20 DIAGNOSIS — Z9181 History of falling: Secondary | ICD-10-CM | POA: Diagnosis not present

## 2018-04-20 DIAGNOSIS — R531 Weakness: Secondary | ICD-10-CM | POA: Diagnosis not present

## 2018-04-20 DIAGNOSIS — R296 Repeated falls: Secondary | ICD-10-CM | POA: Diagnosis not present

## 2018-04-20 DIAGNOSIS — E785 Hyperlipidemia, unspecified: Secondary | ICD-10-CM | POA: Diagnosis not present

## 2018-04-25 DIAGNOSIS — R269 Unspecified abnormalities of gait and mobility: Secondary | ICD-10-CM | POA: Diagnosis not present

## 2018-04-25 DIAGNOSIS — J4520 Mild intermittent asthma, uncomplicated: Secondary | ICD-10-CM | POA: Diagnosis not present

## 2018-04-25 DIAGNOSIS — R531 Weakness: Secondary | ICD-10-CM | POA: Diagnosis not present

## 2018-04-25 DIAGNOSIS — E785 Hyperlipidemia, unspecified: Secondary | ICD-10-CM | POA: Diagnosis not present

## 2018-04-25 DIAGNOSIS — R296 Repeated falls: Secondary | ICD-10-CM | POA: Diagnosis not present

## 2018-04-27 DIAGNOSIS — E785 Hyperlipidemia, unspecified: Secondary | ICD-10-CM | POA: Diagnosis not present

## 2018-04-27 DIAGNOSIS — R531 Weakness: Secondary | ICD-10-CM | POA: Diagnosis not present

## 2018-04-27 DIAGNOSIS — J4520 Mild intermittent asthma, uncomplicated: Secondary | ICD-10-CM | POA: Diagnosis not present

## 2018-04-27 DIAGNOSIS — R269 Unspecified abnormalities of gait and mobility: Secondary | ICD-10-CM | POA: Diagnosis not present

## 2018-04-27 DIAGNOSIS — R296 Repeated falls: Secondary | ICD-10-CM | POA: Diagnosis not present

## 2018-05-02 DIAGNOSIS — R269 Unspecified abnormalities of gait and mobility: Secondary | ICD-10-CM | POA: Diagnosis not present

## 2018-05-02 DIAGNOSIS — R531 Weakness: Secondary | ICD-10-CM | POA: Diagnosis not present

## 2018-05-02 DIAGNOSIS — R296 Repeated falls: Secondary | ICD-10-CM | POA: Diagnosis not present

## 2018-05-02 DIAGNOSIS — J4520 Mild intermittent asthma, uncomplicated: Secondary | ICD-10-CM | POA: Diagnosis not present

## 2018-05-02 DIAGNOSIS — E785 Hyperlipidemia, unspecified: Secondary | ICD-10-CM | POA: Diagnosis not present

## 2018-05-09 DIAGNOSIS — E785 Hyperlipidemia, unspecified: Secondary | ICD-10-CM | POA: Diagnosis not present

## 2018-05-09 DIAGNOSIS — J4520 Mild intermittent asthma, uncomplicated: Secondary | ICD-10-CM | POA: Diagnosis not present

## 2018-05-09 DIAGNOSIS — R296 Repeated falls: Secondary | ICD-10-CM | POA: Diagnosis not present

## 2018-05-09 DIAGNOSIS — R531 Weakness: Secondary | ICD-10-CM | POA: Diagnosis not present

## 2018-05-09 DIAGNOSIS — R269 Unspecified abnormalities of gait and mobility: Secondary | ICD-10-CM | POA: Diagnosis not present

## 2018-05-11 DIAGNOSIS — J4520 Mild intermittent asthma, uncomplicated: Secondary | ICD-10-CM | POA: Diagnosis not present

## 2018-05-11 DIAGNOSIS — R269 Unspecified abnormalities of gait and mobility: Secondary | ICD-10-CM | POA: Diagnosis not present

## 2018-05-11 DIAGNOSIS — R296 Repeated falls: Secondary | ICD-10-CM | POA: Diagnosis not present

## 2018-05-11 DIAGNOSIS — R531 Weakness: Secondary | ICD-10-CM | POA: Diagnosis not present

## 2018-05-11 DIAGNOSIS — E785 Hyperlipidemia, unspecified: Secondary | ICD-10-CM | POA: Diagnosis not present

## 2018-05-24 DIAGNOSIS — J4520 Mild intermittent asthma, uncomplicated: Secondary | ICD-10-CM | POA: Diagnosis not present

## 2018-05-24 DIAGNOSIS — E785 Hyperlipidemia, unspecified: Secondary | ICD-10-CM | POA: Diagnosis not present

## 2018-05-24 DIAGNOSIS — R296 Repeated falls: Secondary | ICD-10-CM | POA: Diagnosis not present

## 2018-05-24 DIAGNOSIS — R531 Weakness: Secondary | ICD-10-CM | POA: Diagnosis not present

## 2018-05-24 DIAGNOSIS — R269 Unspecified abnormalities of gait and mobility: Secondary | ICD-10-CM | POA: Diagnosis not present

## 2018-05-25 DIAGNOSIS — R296 Repeated falls: Secondary | ICD-10-CM | POA: Diagnosis not present

## 2018-05-25 DIAGNOSIS — R269 Unspecified abnormalities of gait and mobility: Secondary | ICD-10-CM | POA: Diagnosis not present

## 2018-05-25 DIAGNOSIS — E785 Hyperlipidemia, unspecified: Secondary | ICD-10-CM | POA: Diagnosis not present

## 2018-05-25 DIAGNOSIS — R531 Weakness: Secondary | ICD-10-CM | POA: Diagnosis not present

## 2018-05-25 DIAGNOSIS — J4520 Mild intermittent asthma, uncomplicated: Secondary | ICD-10-CM | POA: Diagnosis not present

## 2018-06-05 DIAGNOSIS — E785 Hyperlipidemia, unspecified: Secondary | ICD-10-CM | POA: Diagnosis not present

## 2018-06-05 DIAGNOSIS — R269 Unspecified abnormalities of gait and mobility: Secondary | ICD-10-CM | POA: Diagnosis not present

## 2018-06-05 DIAGNOSIS — R531 Weakness: Secondary | ICD-10-CM | POA: Diagnosis not present

## 2018-06-05 DIAGNOSIS — R296 Repeated falls: Secondary | ICD-10-CM | POA: Diagnosis not present

## 2018-06-05 DIAGNOSIS — J4520 Mild intermittent asthma, uncomplicated: Secondary | ICD-10-CM | POA: Diagnosis not present

## 2018-06-07 DIAGNOSIS — J4520 Mild intermittent asthma, uncomplicated: Secondary | ICD-10-CM | POA: Diagnosis not present

## 2018-06-07 DIAGNOSIS — R296 Repeated falls: Secondary | ICD-10-CM | POA: Diagnosis not present

## 2018-06-07 DIAGNOSIS — R269 Unspecified abnormalities of gait and mobility: Secondary | ICD-10-CM | POA: Diagnosis not present

## 2018-06-07 DIAGNOSIS — R531 Weakness: Secondary | ICD-10-CM | POA: Diagnosis not present

## 2018-06-07 DIAGNOSIS — E785 Hyperlipidemia, unspecified: Secondary | ICD-10-CM | POA: Diagnosis not present

## 2018-06-10 DIAGNOSIS — Z9181 History of falling: Secondary | ICD-10-CM | POA: Diagnosis not present

## 2018-06-10 DIAGNOSIS — R296 Repeated falls: Secondary | ICD-10-CM | POA: Diagnosis not present

## 2018-06-10 DIAGNOSIS — J4520 Mild intermittent asthma, uncomplicated: Secondary | ICD-10-CM | POA: Diagnosis not present

## 2018-06-10 DIAGNOSIS — R531 Weakness: Secondary | ICD-10-CM | POA: Diagnosis not present

## 2018-06-10 DIAGNOSIS — E785 Hyperlipidemia, unspecified: Secondary | ICD-10-CM | POA: Diagnosis not present

## 2018-06-10 DIAGNOSIS — R269 Unspecified abnormalities of gait and mobility: Secondary | ICD-10-CM | POA: Diagnosis not present

## 2018-06-12 ENCOUNTER — Ambulatory Visit: Payer: Medicare Other | Admitting: Family Medicine

## 2018-06-20 DIAGNOSIS — J4520 Mild intermittent asthma, uncomplicated: Secondary | ICD-10-CM | POA: Diagnosis not present

## 2018-06-20 DIAGNOSIS — E785 Hyperlipidemia, unspecified: Secondary | ICD-10-CM | POA: Diagnosis not present

## 2018-06-20 DIAGNOSIS — R269 Unspecified abnormalities of gait and mobility: Secondary | ICD-10-CM | POA: Diagnosis not present

## 2018-06-20 DIAGNOSIS — R296 Repeated falls: Secondary | ICD-10-CM | POA: Diagnosis not present

## 2018-06-20 DIAGNOSIS — R531 Weakness: Secondary | ICD-10-CM | POA: Diagnosis not present

## 2018-06-20 DIAGNOSIS — Z9181 History of falling: Secondary | ICD-10-CM | POA: Diagnosis not present

## 2018-06-23 DIAGNOSIS — J4520 Mild intermittent asthma, uncomplicated: Secondary | ICD-10-CM | POA: Diagnosis not present

## 2018-06-23 DIAGNOSIS — R531 Weakness: Secondary | ICD-10-CM | POA: Diagnosis not present

## 2018-06-23 DIAGNOSIS — E785 Hyperlipidemia, unspecified: Secondary | ICD-10-CM | POA: Diagnosis not present

## 2018-06-23 DIAGNOSIS — R296 Repeated falls: Secondary | ICD-10-CM | POA: Diagnosis not present

## 2018-06-23 DIAGNOSIS — R269 Unspecified abnormalities of gait and mobility: Secondary | ICD-10-CM | POA: Diagnosis not present

## 2018-06-27 DIAGNOSIS — R269 Unspecified abnormalities of gait and mobility: Secondary | ICD-10-CM | POA: Diagnosis not present

## 2018-06-27 DIAGNOSIS — R531 Weakness: Secondary | ICD-10-CM | POA: Diagnosis not present

## 2018-06-27 DIAGNOSIS — J4520 Mild intermittent asthma, uncomplicated: Secondary | ICD-10-CM | POA: Diagnosis not present

## 2018-06-27 DIAGNOSIS — E785 Hyperlipidemia, unspecified: Secondary | ICD-10-CM | POA: Diagnosis not present

## 2018-06-27 DIAGNOSIS — R296 Repeated falls: Secondary | ICD-10-CM | POA: Diagnosis not present

## 2018-07-04 DIAGNOSIS — J4520 Mild intermittent asthma, uncomplicated: Secondary | ICD-10-CM | POA: Diagnosis not present

## 2018-07-04 DIAGNOSIS — R296 Repeated falls: Secondary | ICD-10-CM | POA: Diagnosis not present

## 2018-07-04 DIAGNOSIS — R531 Weakness: Secondary | ICD-10-CM | POA: Diagnosis not present

## 2018-07-04 DIAGNOSIS — E785 Hyperlipidemia, unspecified: Secondary | ICD-10-CM | POA: Diagnosis not present

## 2018-07-04 DIAGNOSIS — R269 Unspecified abnormalities of gait and mobility: Secondary | ICD-10-CM | POA: Diagnosis not present

## 2018-07-09 ENCOUNTER — Encounter (HOSPITAL_COMMUNITY): Payer: Self-pay | Admitting: Emergency Medicine

## 2018-07-09 ENCOUNTER — Observation Stay (HOSPITAL_COMMUNITY): Payer: Medicare Other

## 2018-07-09 ENCOUNTER — Inpatient Hospital Stay (HOSPITAL_COMMUNITY)
Admission: EM | Admit: 2018-07-09 | Discharge: 2018-07-13 | DRG: 202 | Disposition: A | Discharge status: Home / Self Care | Payer: Medicare Other | Attending: Internal Medicine | Admitting: Internal Medicine

## 2018-07-09 ENCOUNTER — Other Ambulatory Visit: Payer: Self-pay

## 2018-07-09 ENCOUNTER — Emergency Department (HOSPITAL_COMMUNITY): Payer: Medicare Other

## 2018-07-09 DIAGNOSIS — R296 Repeated falls: Secondary | ICD-10-CM

## 2018-07-09 DIAGNOSIS — I2510 Atherosclerotic heart disease of native coronary artery without angina pectoris: Secondary | ICD-10-CM | POA: Diagnosis present

## 2018-07-09 DIAGNOSIS — Z885 Allergy status to narcotic agent status: Secondary | ICD-10-CM

## 2018-07-09 DIAGNOSIS — Z7951 Long term (current) use of inhaled steroids: Secondary | ICD-10-CM

## 2018-07-09 DIAGNOSIS — G8929 Other chronic pain: Secondary | ICD-10-CM | POA: Diagnosis present

## 2018-07-09 DIAGNOSIS — J4520 Mild intermittent asthma, uncomplicated: Secondary | ICD-10-CM | POA: Diagnosis not present

## 2018-07-09 DIAGNOSIS — J4521 Mild intermittent asthma with (acute) exacerbation: Secondary | ICD-10-CM | POA: Diagnosis not present

## 2018-07-09 DIAGNOSIS — R079 Chest pain, unspecified: Secondary | ICD-10-CM | POA: Diagnosis not present

## 2018-07-09 DIAGNOSIS — E785 Hyperlipidemia, unspecified: Secondary | ICD-10-CM | POA: Diagnosis not present

## 2018-07-09 DIAGNOSIS — R05 Cough: Secondary | ICD-10-CM | POA: Diagnosis not present

## 2018-07-09 DIAGNOSIS — M546 Pain in thoracic spine: Secondary | ICD-10-CM | POA: Diagnosis not present

## 2018-07-09 DIAGNOSIS — J441 Chronic obstructive pulmonary disease with (acute) exacerbation: Secondary | ICD-10-CM | POA: Diagnosis not present

## 2018-07-09 DIAGNOSIS — R0789 Other chest pain: Secondary | ICD-10-CM | POA: Diagnosis present

## 2018-07-09 DIAGNOSIS — Z888 Allergy status to other drugs, medicaments and biological substances status: Secondary | ICD-10-CM

## 2018-07-09 DIAGNOSIS — R059 Cough, unspecified: Secondary | ICD-10-CM

## 2018-07-09 DIAGNOSIS — Z880 Allergy status to penicillin: Secondary | ICD-10-CM

## 2018-07-09 DIAGNOSIS — I491 Atrial premature depolarization: Secondary | ICD-10-CM | POA: Diagnosis present

## 2018-07-09 DIAGNOSIS — E7849 Other hyperlipidemia: Secondary | ICD-10-CM | POA: Diagnosis not present

## 2018-07-09 DIAGNOSIS — Z6841 Body Mass Index (BMI) 40.0 and over, adult: Secondary | ICD-10-CM | POA: Diagnosis not present

## 2018-07-09 DIAGNOSIS — Z7982 Long term (current) use of aspirin: Secondary | ICD-10-CM

## 2018-07-09 DIAGNOSIS — Z87891 Personal history of nicotine dependence: Secondary | ICD-10-CM

## 2018-07-09 DIAGNOSIS — M545 Low back pain: Secondary | ICD-10-CM | POA: Diagnosis present

## 2018-07-09 DIAGNOSIS — Z79899 Other long term (current) drug therapy: Secondary | ICD-10-CM

## 2018-07-09 DIAGNOSIS — F329 Major depressive disorder, single episode, unspecified: Secondary | ICD-10-CM | POA: Diagnosis present

## 2018-07-09 DIAGNOSIS — I10 Essential (primary) hypertension: Secondary | ICD-10-CM | POA: Diagnosis not present

## 2018-07-09 DIAGNOSIS — M549 Dorsalgia, unspecified: Secondary | ICD-10-CM

## 2018-07-09 DIAGNOSIS — E559 Vitamin D deficiency, unspecified: Secondary | ICD-10-CM | POA: Diagnosis present

## 2018-07-09 LAB — CBC
HEMATOCRIT: 45.8 % (ref 36.0–46.0)
HEMOGLOBIN: 13.6 g/dL (ref 12.0–15.0)
MCH: 25.9 pg — ABNORMAL LOW (ref 26.0–34.0)
MCHC: 29.7 g/dL — ABNORMAL LOW (ref 30.0–36.0)
MCV: 87.2 fL (ref 78.0–100.0)
Platelets: 368 10*3/uL (ref 150–400)
RBC: 5.25 MIL/uL — AB (ref 3.87–5.11)
RDW: 16 % — AB (ref 11.5–15.5)
WBC: 9.3 10*3/uL (ref 4.0–10.5)

## 2018-07-09 LAB — I-STAT TROPONIN, ED: Troponin i, poc: 0.04 ng/mL (ref 0.00–0.08)

## 2018-07-09 LAB — BASIC METABOLIC PANEL
ANION GAP: 10 (ref 5–15)
BUN: 10 mg/dL (ref 8–23)
CALCIUM: 9.2 mg/dL (ref 8.9–10.3)
CO2: 25 mmol/L (ref 22–32)
Chloride: 105 mmol/L (ref 98–111)
Creatinine, Ser: 0.75 mg/dL (ref 0.44–1.00)
GFR calc non Af Amer: >60 mL/min (ref >60)
Glucose, Bld: 111 mg/dL — ABNORMAL HIGH (ref 70–99)
Potassium: 4.6 mmol/L (ref 3.5–5.1)
Sodium: 140 mmol/L (ref 135–145)

## 2018-07-09 LAB — BRAIN NATRIURETIC PEPTIDE: B Natriuretic Peptide: 15.9 pg/mL (ref 0.0–100.0)

## 2018-07-09 LAB — TROPONIN I

## 2018-07-09 LAB — D-DIMER, QUANTITATIVE (NOT AT ARMC)

## 2018-07-09 MED ORDER — MORPHINE SULFATE (PF) 4 MG/ML IV SOLN: 2.0000 mg | INTRAVENOUS | Status: DC | PRN

## 2018-07-09 MED ORDER — GUAIFENESIN ER 600 MG PO TB12
600.0000 mg | ORAL_TABLET | Freq: Two times a day (BID) | ORAL | Status: DC
  Administered 2018-07-09 – 2018-07-13 (×8): 600 mg via ORAL
  Filled 2018-07-09 (×8): qty 1

## 2018-07-09 MED ORDER — FLUTICASONE PROPIONATE 50 MCG/ACT NA SUSP
2.0000 | Freq: Every day | NASAL | Status: DC | PRN
  Administered 2018-07-10: 2 via NASAL
  Filled 2018-07-09: qty 16

## 2018-07-09 MED ORDER — GI COCKTAIL ~~LOC~~
30.0000 mL | Freq: Four times a day (QID) | ORAL | Status: DC | PRN
  Administered 2018-07-12: 30 mL via ORAL
  Filled 2018-07-09: qty 30

## 2018-07-09 MED ORDER — ASPIRIN EC 81 MG PO TBEC
81.0000 mg | DELAYED_RELEASE_TABLET | Freq: Every day | ORAL | Status: DC
  Administered 2018-07-10 – 2018-07-13 (×4): 81 mg via ORAL
  Filled 2018-07-09 (×4): qty 1

## 2018-07-09 MED ORDER — PREDNISONE 20 MG PO TABS
40.0000 mg | ORAL_TABLET | Freq: Every day | ORAL | Status: DC
  Administered 2018-07-10 – 2018-07-13 (×4): 40 mg via ORAL
  Filled 2018-07-09 (×4): qty 2

## 2018-07-09 MED ORDER — METHYLPREDNISOLONE SODIUM SUCC 125 MG IJ SOLR
125.0000 mg | Freq: Once | INTRAMUSCULAR | Status: AC
  Administered 2018-07-09: 125 mg via INTRAVENOUS
  Filled 2018-07-09: qty 2

## 2018-07-09 MED ORDER — IPRATROPIUM-ALBUTEROL 0.5-2.5 (3) MG/3ML IN SOLN
3.0000 mL | RESPIRATORY_TRACT | Status: DC | PRN
Start: 2018-07-09 — End: 2018-07-10

## 2018-07-09 MED ORDER — ASPIRIN 81 MG PO CHEW
324.0000 mg | CHEWABLE_TABLET | Freq: Once | ORAL | Status: AC
  Administered 2018-07-09: 324 mg via ORAL
  Filled 2018-07-09: qty 4

## 2018-07-09 MED ORDER — PRAVASTATIN SODIUM 10 MG PO TABS
10.0000 mg | ORAL_TABLET | Freq: Every day | ORAL | Status: DC
  Administered 2018-07-10 – 2018-07-13 (×4): 10 mg via ORAL
  Filled 2018-07-09 (×5): qty 1

## 2018-07-09 MED ORDER — ONDANSETRON HCL 4 MG/2ML IJ SOLN
4.0000 mg | Freq: Four times a day (QID) | INTRAMUSCULAR | Status: DC | PRN
Start: 2018-07-09 — End: 2018-07-13

## 2018-07-09 MED ORDER — IPRATROPIUM-ALBUTEROL 0.5-2.5 (3) MG/3ML IN SOLN
3.0000 mL | Freq: Once | RESPIRATORY_TRACT | Status: AC
  Administered 2018-07-09: 3 mL via RESPIRATORY_TRACT
  Filled 2018-07-09: qty 3

## 2018-07-09 MED ORDER — ENOXAPARIN SODIUM 40 MG/0.4ML ~~LOC~~ SOLN
40.0000 mg | SUBCUTANEOUS | Status: DC
  Administered 2018-07-09 – 2018-07-12 (×4): 40 mg via SUBCUTANEOUS
  Filled 2018-07-09 (×4): qty 0.4

## 2018-07-09 MED ORDER — ACETAMINOPHEN 325 MG PO TABS
650.0000 mg | ORAL_TABLET | ORAL | Status: DC | PRN
  Administered 2018-07-09 – 2018-07-10 (×2): 650 mg via ORAL
  Filled 2018-07-09 (×2): qty 2

## 2018-07-09 MED ORDER — IPRATROPIUM-ALBUTEROL 0.5-2.5 (3) MG/3ML IN SOLN
3.0000 mL | Freq: Four times a day (QID) | RESPIRATORY_TRACT | Status: DC
  Administered 2018-07-09 (×2): 3 mL via RESPIRATORY_TRACT
  Filled 2018-07-09 (×2): qty 3

## 2018-07-09 NOTE — ED Provider Notes (Signed)
[facility] EMERGENCY DEPARTMENT Provider Note   CSN: [id number] Arrival date & time: 07/09/18  9767     History   Chief Complaint Chief Complaint  Patient presents with  . Chest Pain    HPI [name] is a 84 y.o. female.  HPI Patient presents to the emergency room for evaluation of chest pain.  Patient has been having trouble with cough for the last several days.  She has been feeling short of breath.  Patient has also noticed pain in her chest with coughing or deep breathing.  She has noticed wheezing.  Patient has used inhalers in the past but does not have regular issues with her breathing.  She denies any fevers or chills.  She has not had any leg swelling.  No sore throat or difficulty speaking.  Patient has had some troubles with falls in the last few months but no fall in the last several days.  No known injuries. Past Medical History:  Diagnosis Date  . Asthma   . Back pain   . Depression   . Family history of arthritis   . Family history of diabetes mellitus   . Family history of ischemic heart disease   . Headache(784.0)   . High blood pressure   . High cholesterol   . Hyperlipidemia   . Hypertension   . Obesity   . Pain from implanted hardware 01/28/2012  . Personal history of unspecified circulatory disease   . Sciatica   . Shortness of breath   . Urinary incontinence     Patient Active Problem List   Diagnosis Date Noted  . Need for influenza vaccination 01/31/2017  . Primary osteoarthritis of both knees 05/16/2016  . Sleep disorder 08/25/2015  . Reduced vision 03/19/2015  . Seasonal allergies 11/18/2014  . Unspecified vitamin D deficiency 05/13/2014  . Asthma, chronic 08/07/2013  . Vitamin D deficiency 04/10/2012  . Right leg weakness 11/10/2011  . Multiple falls 10/31/2011  . DEGENERATIVE DISC DISEASE, LUMBOSACRAL SPINE W/RADICULOPATHY 10/07/2010  . IGT (impaired glucose tolerance) 09/07/2010  . OTHER OSTEOPOROSIS  08/22/2008  . Hyperlipemia 02/07/2008  . Morbid obesity ([place]) 02/07/2008  . SCIATICA 01/04/2008    Past Surgical History:  Procedure Laterality Date  . ABDOMINAL HYSTERECTOMY    . BACK SURGERY  2009   Dr. Ronnald Ramp [place] Nuerosurgeon  . COLONOSCOPY    . HARDWARE REMOVAL  01/28/2012   Procedure: HARDWARE REMOVAL;  Surgeon: Johnny Bridge, MD;  Location: [facility];  Service: Orthopedics;  Laterality: Right;  Right Knee  . right knee surgery after fracture  2006  . SPINE SURGERY       OB History   None      Home Medications    Prior to Admission medications   Medication Sig Start Date End Date Taking? Authorizing Provider  albuterol (PROAIR HFA) 108 (90 Base) MCG/ACT inhaler inhale 2 puffs every 6 hours if needed for wheezing 01/23/18  Yes Fayrene Helper, MD  aspirin EC 81 MG tablet Take 1 tablet (81 mg total) by mouth daily. 01/05/17  Yes Fayrene Helper, MD  calcium-vitamin D (CALCIUM 500+D HIGH POTENCY) 500-400 MG-UNIT tablet Take 1 tablet by mouth daily.   Yes [provider]  fluticasone (FLONASE) 50 MCG/ACT nasal spray instill 2 sprays into each nostril once daily Patient taking differently: Place 2 sprays into both nostrils daily as needed for allergies. instill 2 sprays into each nostril once daily 01/23/18  Yes Simpson,  Norwood Levo, MD  Ibuprofen (ADVIL) 200 MG CAPS Take 2 capsules by mouth daily as needed.   Yes [provider]  pravastatin (PRAVACHOL) 10 MG tablet Take 1 tablet (10 mg total) by mouth daily. 02/01/18  Yes Fayrene Helper, MD  temazepam (RESTORIL) 7.5 MG capsule Take 1 capsule (7.5 mg total) by mouth at bedtime as needed for sleep. Patient not taking: Reported on 07/09/2018 10/14/17   Fayrene Helper, MD    Family History Family History  Problem Relation Age of Onset  . Breast cancer Mother        family history   . Hypertension Mother   . Diabetes Mother   . Hypertension Father   . Stroke Father   .  Hypertension Sister   . Hypertension Brother   . Hypertension Brother   . Hypertension Brother   . Diabetes Sister   . Stroke Brother   . Heart defect Unknown        family history   . Colon cancer Neg Hx   . Colon polyps Neg Hx   . Esophageal cancer Neg Hx     Social History Social History   Tobacco Use  . Smoking status: Former Smoker    Packs/day: 0.50    Years: 30.00    Pack years: 15.00    Types: Cigarettes    Last attempt to quit: 12/27/1993    Years since quitting: 24.5  . Smokeless tobacco: Never Used  Substance Use Topics  . Alcohol use: No    Alcohol/week: 0.0 oz  . Drug use: No     Allergies   Amlodipine; Oxycodone; Penicillins; Pravastatin; and Tramadol   Review of Systems Review of Systems  All other systems reviewed and are negative.    Physical Exam Updated Vital Signs BP 136/71   Pulse 83   Temp 98.7 F (37.1 C) (Oral)   Resp 20   SpO2 92%   Physical Exam  Constitutional: She appears well-developed and well-nourished. No distress.  HENT:  Head: Normocephalic and atraumatic.  Right Ear: External ear normal.  Left Ear: External ear normal.  Eyes: Conjunctivae are normal. Right eye exhibits no discharge. Left eye exhibits no discharge. No scleral icterus.  Neck: Neck supple. No tracheal deviation present.  Cardiovascular: Normal rate, regular rhythm and intact distal pulses.  Pulmonary/Chest: Effort normal. No stridor. No respiratory distress. She has wheezes. She has no rales.  Abdominal: Soft. Bowel sounds are normal. She exhibits no distension. There is no tenderness. There is no rebound and no guarding.  Musculoskeletal: She exhibits no edema or tenderness.  Neurological: She is alert. She has normal strength. No cranial nerve deficit (no facial droop, extraocular movements intact, no slurred speech) or sensory deficit. She exhibits normal muscle tone. She displays no seizure activity. Coordination normal.  Skin: Skin is warm and dry. No  rash noted.  Psychiatric: She has a normal mood and affect.  Nursing note and vitals reviewed.    ED Treatments / Results  Labs (all labs ordered are listed, but only abnormal results are displayed) Labs Reviewed  BASIC METABOLIC PANEL - Abnormal; Notable for the following components:      Result Value   Glucose, Bld 111 (*)    All other components within normal limits  CBC - Abnormal; Notable for the following components:   RBC 5.25 (*)    MCH 25.9 (*)    MCHC 29.7 (*)    RDW 16.0 (*)    All other  components within normal limits  BRAIN NATRIURETIC PEPTIDE  I-STAT TROPONIN, ED  I-STAT TROPONIN, ED    EKG EKG Interpretation  Date/Time:  Sunday July 09 2018 09:30:58 EDT Ventricular Rate:  93 PR Interval:  146 QRS Duration: 70 QT Interval:  358 QTC Calculation: 445 R Axis:   -11 Text Interpretation:  Sinus rhythm with Premature atrial complexes Left ventricular hypertrophy Abnormal ECG Confirmed by Dorie Rank [phone]) on 07/09/2018 11:10:20 AM   Radiology Dg Chest 2 View  Result Date: 07/09/2018 CLINICAL DATA:  Pt reports left sided chest pain and left arm pain, reports sob and productive cough EXAM: CHEST - 2 VIEW COMPARISON:  09/15/2015 FINDINGS: Shallow lung inflation. Heart size is normal accounting for the AP position. The lungs are clear. No pulmonary edema. IMPRESSION: Shallow inflation. No evidence for acute cardiopulmonary abnormality. Electronically Signed   By: Nolon Nations [name]   On: 07/09/2018 10:13    Procedures Procedures (including critical care time)  Medications Ordered in ED Medications  methylPREDNISolone sodium succinate (SOLU-MEDROL) 125 mg/2 mL injection 125 mg (125 mg Intravenous Given 07/09/18 1142)  ipratropium-albuterol (DUONEB) 0.5-2.5 (3) MG/3ML nebulizer solution 3 mL (3 mLs Nebulization Given 07/09/18 1131)     Initial Impression / Assessment and Plan / ED Course  I have reviewed the triage vital signs and the nursing  notes.  Pertinent labs & imaging results that were available during my care of the patient were reviewed by me and considered in my medical decision making (see chart for details).  Clinical Course as of Jul 09 1304  Sun Jul 09, 2018  1303 Feeling better after treatment but still wheezing. O2 sat high 80s low 90s   [JK]    Clinical Course User Index [JK] Dorie Rank, MD    Patient presented to the emergency room for evaluation of shortness of breath, chest and arm pain.  Patient does not have a history of heart disease.  On exam in the emergency room patient was noted to be wheezing.  She was given a breathing treatment with some improvement in her symptoms.  However, she continues to have a wheeze and her oxygen saturations rubs down into the 80s at rest.  I suspect the chest pain is related to her respiratory issues however it is reasonable continue cycling cardiac enzymes.  Considering the patient's persistent wheezing and borderline oxygen levels I will consult the medical service to bring her in for continued treatment  Final Clinical Impressions(s) / ED Diagnoses   Final diagnoses:  COPD exacerbation ([place])  Chest pain, unspecified type      Dorie Rank, MD 07/09/18 1307

## 2018-07-09 NOTE — ED Triage Notes (Signed)
Pt reports left sided chest pain and left arm pain, reports sob and productive cough, hx of asthma

## 2018-07-09 NOTE — H&P (Addendum)
History and Physical    [name]:[id number] DOB: August 23, 1934 DOA: 07/09/2018  Referring MD/NP/PA: Dr. Dorie Rank PCP: Fayrene Helper, MD  Patient coming from: home  Chief Complaint: Shortness of breath and chest Pain  I have personally briefly reviewed patient's old medical records in [place]   HPI: [name] is a 84 y.o. female with medical history significant of HTN, HLD, asthma, and obesity; who presents with complaints of shortness of breath and chest pain. Patient admits that same as the symptoms are not necessarily new, but worsened overnight. She has had left-sided chest pain that appears to come and go, but only lasts a few seconds and then self resolved.  She reports pain will radiate down her left arm with symptoms sometimes pain will radiate down her left arm to her fingers.  She reports having significant shortness of breath with any kind of exertion and has had a productive cough with subjective fever. Coughing worsens left mid to lower back pain which patient describes as a rubbing pain and has been going on for weeks.  She has been taking ibuprofen at night for pain with minimal relief.  Patient also has been receiving physical therapy without any significant improvement.  Denies any recent sick contacts to her knowledge, saddle anesthesia, difficulty urinating, diarrhea, constipation, or leg swelling.  Associated symptoms include difficulty ambulating due to back pain, l question syncopal events, and falls.  Last fall approximately 2 weeks ago.   ED Course: Upon admission into the emergency department patient was noted to be afebrile, respirations 19-27, O2 saturations 90-94% on room air, and all other vital signs maintained.  Labs revealed troponin 0.04 and the rest of the CBC and BMP was noted to be unremarkable.  Chest x-ray low lung volumes, but did not show any acute abnormalities.  EKG did not show any clear ischemic changes.  Patient was given 125  mg of Solu-Medrol IV, DuoNeb breathing treatment, and 324 mg of aspirin p.o.  TRH called to admit for observation.  Review of Systems  Constitutional: Positive for fever and malaise/fatigue.  HENT: Negative for nosebleeds and sore throat.   Eyes: Negative for photophobia and pain.  Respiratory: Positive for cough, sputum production, shortness of breath and wheezing.   Cardiovascular: Positive for chest pain. Negative for leg swelling.  Gastrointestinal: Negative for abdominal pain, diarrhea, nausea and vomiting.  Genitourinary: Negative for dysuria and hematuria.  Musculoskeletal: Positive for back pain and falls.  Neurological: Positive for loss of consciousness. Negative for focal weakness.  Psychiatric/Behavioral: Negative for memory loss and substance abuse.    Past Medical History:  Diagnosis Date  . Asthma   . Back pain   . Depression   . Family history of arthritis   . Family history of diabetes mellitus   . Family history of ischemic heart disease   . Headache(784.0)   . High blood pressure   . High cholesterol   . Hyperlipidemia   . Hypertension   . Obesity   . Pain from implanted hardware 01/28/2012  . Personal history of unspecified circulatory disease   . Sciatica   . Shortness of breath   . Urinary incontinence     Past Surgical History:  Procedure Laterality Date  . ABDOMINAL HYSTERECTOMY    . BACK SURGERY  2009   Dr. Ronnald Ramp [facility]  . COLONOSCOPY    . HARDWARE REMOVAL  01/28/2012   Procedure: HARDWARE REMOVAL;  Surgeon: Johnny Bridge, MD;  Location: [facility];  Service: Orthopedics;  Laterality: Right;  Right Knee  . right knee surgery after fracture  2006  . SPINE SURGERY       reports that she quit smoking about 24 years ago. Her smoking use included cigarettes. She has a 15.00 pack-year smoking history. She has never used smokeless tobacco. She reports that she does not drink alcohol or use drugs.  Allergies  Allergen  Reactions  . Amlodipine     Headache   . Oxycodone Nausea And Vomiting  . Penicillins Swelling  . Pravastatin Other (See Comments)    Gives pt headaches  . Tramadol Other (See Comments)    Pt "unsure"    Family History  Problem Relation Age of Onset  . Breast cancer Mother        family history   . Hypertension Mother   . Diabetes Mother   . Hypertension Father   . Stroke Father   . Hypertension Sister   . Hypertension Brother   . Hypertension Brother   . Hypertension Brother   . Diabetes Sister   . Stroke Brother   . Heart defect Unknown        family history   . Colon cancer Neg Hx   . Colon polyps Neg Hx   . Esophageal cancer Neg Hx     Prior to Admission medications   Medication Sig Start Date End Date Taking? Authorizing Provider  albuterol (PROAIR HFA) 108 (90 Base) MCG/ACT inhaler inhale 2 puffs every 6 hours if needed for wheezing 01/23/18  Yes Fayrene Helper, MD  aspirin EC 81 MG tablet Take 1 tablet (81 mg total) by mouth daily. 01/05/17  Yes Fayrene Helper, MD  calcium-vitamin D (CALCIUM 500+D HIGH POTENCY) 500-400 MG-UNIT tablet Take 1 tablet by mouth daily.   Yes [provider]  fluticasone (FLONASE) 50 MCG/ACT nasal spray instill 2 sprays into each nostril once daily Patient taking differently: Place 2 sprays into both nostrils daily as needed for allergies. instill 2 sprays into each nostril once daily 01/23/18  Yes Fayrene Helper, MD  Ibuprofen (ADVIL) 200 MG CAPS Take 2 capsules by mouth daily as needed.   Yes [provider]  pravastatin (PRAVACHOL) 10 MG tablet Take 1 tablet (10 mg total) by mouth daily. 02/01/18  Yes Fayrene Helper, MD  temazepam (RESTORIL) 7.5 MG capsule Take 1 capsule (7.5 mg total) by mouth at bedtime as needed for sleep. Patient not taking: Reported on 07/09/2018 10/14/17   Fayrene Helper, MD    Physical Exam:  Constitutional: Elderly female in NAD, calm, comfortable Vitals:   07/09/18 1100  07/09/18 1115 07/09/18 1130 07/09/18 1200  BP: 122/67 108/62 114/68 136/71  Pulse: 81 74 77 83  Resp: (!) 27 (!) 23 19 20   Temp:      TempSrc:      SpO2: 92% 93% 90% 92%   Eyes: PERRL, lids and conjunctivae normal ENMT: Mucous membranes are moist. Posterior pharynx clear of any exudate or lesions.  Neck: normal, supple, no masses, no thyromegaly.  No JVD Respiratory: Mildly tachypneic with expiratory wheeze.  Patient O2 saturations maintained on room air but seem to drop in the upper 80s with movement or prolonged conversation. Cardiovascular: Regular rate and rhythm, no murmurs / rubs / gallops. No extremity edema. 2+ pedal pulses. No carotid bruits.  Abdomen: no tenderness, no masses palpated. No hepatosplenomegaly. Bowel sounds positive.  Musculoskeletal: no clubbing / cyanosis. No joint  deformity upper and lower extremities. Good ROM, no contractures.  Increased muscle tone of the left mid thoracic paraspinal muscles.    Skin: no rashes, lesions, ulcers. No induration.  Healed previous scar from lumbar back surgery. Neurologic: CN 2-12 grossly intact. Sensation intact, DTR normal. Strength 5/5 in all 4.  Psychiatric: Normal judgment and insight. Alert and oriented x 3. Normal mood.     Labs on Admission: I have personally reviewed following labs and imaging studies  CBC: Recent Labs  Lab 07/09/18 0934  WBC 9.3  HGB 13.6  HCT 45.8  MCV 87.2  PLT 301   Basic Metabolic Panel: Recent Labs  Lab 07/09/18 0934  NA 140  K 4.6  CL 105  CO2 25  GLUCOSE 111*  BUN 10  CREATININE 0.75  CALCIUM 9.2   GFR: CrCl cannot be calculated (Unknown ideal weight.). Liver Function Tests: No results for input(s): AST, ALT, ALKPHOS, BILITOT, PROT, ALBUMIN in the last 168 hours. No results for input(s): LIPASE, AMYLASE in the last 168 hours. No results for input(s): AMMONIA in the last 168 hours. Coagulation Profile: No results for input(s): INR, PROTIME in the last 168 hours. Cardiac  Enzymes: No results for input(s): CKTOTAL, CKMB, CKMBINDEX, TROPONINI in the last 168 hours. BNP (last 3 results) No results for input(s): PROBNP in the last 8760 hours. HbA1C: No results for input(s): HGBA1C in the last 72 hours. CBG: No results for input(s): GLUCAP in the last 168 hours. Lipid Profile: No results for input(s): CHOL, HDL, LDLCALC, TRIG, CHOLHDL, LDLDIRECT in the last 72 hours. Thyroid Function Tests: No results for input(s): TSH, T4TOTAL, FREET4, T3FREE, THYROIDAB in the last 72 hours. Anemia Panel: No results for input(s): VITAMINB12, FOLATE, FERRITIN, TIBC, IRON, RETICCTPCT in the last 72 hours. Urine analysis:    Component Value Date/Time   COLORURINE YELLOW 04/22/2011 1430   APPEARANCEUR CLEAR 04/22/2011 1430   LABSPEC 1.025 04/22/2011 1430   PHURINE 6.5 04/22/2011 1430   GLUCOSEU NEGATIVE 04/22/2011 1430   HGBUR NEGATIVE 04/22/2011 1430   BILIRUBINUR NEGATIVE 04/22/2011 1430   KETONESUR NEGATIVE 04/22/2011 1430   PROTEINUR 100 (A) 04/22/2011 1430   UROBILINOGEN 0.2 04/22/2011 1430   NITRITE NEGATIVE 04/22/2011 1430   LEUKOCYTESUR NEGATIVE 04/22/2011 1430   Sepsis Labs: No results found for this or any previous visit (from the past 240 hour(s)).   Radiological Exams on Admission: Dg Chest 2 View  Result Date: 07/09/2018 CLINICAL DATA:  Pt reports left sided chest pain and left arm pain, reports sob and productive cough EXAM: CHEST - 2 VIEW COMPARISON:  09/15/2015 FINDINGS: Shallow lung inflation. Heart size is normal accounting for the AP position. The lungs are clear. No pulmonary edema. IMPRESSION: Shallow inflation. No evidence for acute cardiopulmonary abnormality. Electronically Signed   By: Nolon Nations [name]   On: 07/09/2018 10:13    EKG: Independently reviewed.  Sinus rhythm at 93 bpm with signs of LVH.  Assessment/Plan Chest pain: Acute.  Patient presents with complaints of chest pain with a left sided arm pain.   Initial troponin and EKG  showing no significant signs of ischemia.  Heart score is approximately 6 given age, history, and risk factors.  Symptoms could be cardiac in nature or related with respiratory symptoms.  Lower risk for PE. - Admit to a telemetry bed - Trend cardiac enzymes x3 - Check d-dimer - Check echocardiogram - Continue aspirin - Message sent for cardiology to eval in a.m. for possible need of stress testing - N.p.o.  after midnight  Bronchitis, history of asthma: Acute.  Patient has a history of asthma.  Noted to be wheezing on physical exam. Given 125 mg of Solu-Medrol IV and DuoNeb breathing treatment in the ED. O2 sats intermittently seem to drop into the upper 80s with talking.  Patient reports subjective fevers, but afebrile with no significant leukocytosis on admission.  Chest x-ray otherwise clear. - Continuous pulse oximetry overnight - DuoNeb's QID and prn - Prednisone 40 mg q. A.m. - Mucinex   Back pain: Acute.  Patient reports having left mid to lower sided back pain.  She reports having several falls last 2 weeks ago. - Check x-rays of the thoracic and lumbar spine - Prednisone for suspected bronchitis may help back as well. - Tramadol as needed pain  Multiple falls, question syncopal episodes: Patient currently receiving physical therapy at home 2 times per week. - May warrant further investigation or testing  Essential hypertension: Patient currently not on any occasions for treatment. - Continue to monitor  Hyperlipidemia: Patient reports intermittent compliance with pravastatin but does not give clear cause. - Check lipid panel - Continue pravastatin   DVT prophylaxis: lovenox  Code Status: Full  Family Communication: Discussed plan of care patient and family present at bedside Disposition Plan: Discharge home in 1 to 2 days, if medically stable Consults called: None Admission status: observation  Norval Morton MD [facility] Pager [phone]   If 7PM-7AM,  please contact night-coverage [email or web address] Password [email or web address]  07/09/2018, 1:24 PM

## 2018-07-09 NOTE — ED Notes (Signed)
Patient reports feeling short of breath, she reports left chest pain that gets worse with cough. She reports that she has fallen multiple times in the last 3 months-when asked how the falls happened. Patient states "I don't know, I just find myself on the floor". Her daughter is at bedside, who states she was cooking last week and just "passed out and was on the floor"

## 2018-07-09 NOTE — ED Notes (Signed)
Dinner Tray ordered 

## 2018-07-10 ENCOUNTER — Other Ambulatory Visit: Payer: Self-pay

## 2018-07-10 ENCOUNTER — Observation Stay (HOSPITAL_BASED_OUTPATIENT_CLINIC_OR_DEPARTMENT_OTHER): Payer: Medicare Other

## 2018-07-10 DIAGNOSIS — R079 Chest pain, unspecified: Secondary | ICD-10-CM | POA: Diagnosis not present

## 2018-07-10 DIAGNOSIS — J4520 Mild intermittent asthma, uncomplicated: Secondary | ICD-10-CM | POA: Diagnosis not present

## 2018-07-10 DIAGNOSIS — R296 Repeated falls: Secondary | ICD-10-CM | POA: Diagnosis not present

## 2018-07-10 DIAGNOSIS — E7849 Other hyperlipidemia: Secondary | ICD-10-CM

## 2018-07-10 LAB — ECHOCARDIOGRAM COMPLETE
HEIGHTINCHES: 64 in
WEIGHTICAEL: 3947.12 [oz_av]

## 2018-07-10 LAB — BASIC METABOLIC PANEL
Anion gap: 12 (ref 5–15)
BUN: 13 mg/dL (ref 8–23)
CALCIUM: 9.3 mg/dL (ref 8.9–10.3)
CO2: 22 mmol/L (ref 22–32)
CREATININE: 0.71 mg/dL (ref 0.44–1.00)
Chloride: 107 mmol/L (ref 98–111)
Glucose, Bld: 131 mg/dL — ABNORMAL HIGH (ref 70–99)
Potassium: 3.9 mmol/L (ref 3.5–5.1)
Sodium: 141 mmol/L (ref 135–145)

## 2018-07-10 LAB — TROPONIN I

## 2018-07-10 LAB — LIPID PANEL
Cholesterol: 197 mg/dL (ref 0–200)
HDL: 58 mg/dL (ref >40)
LDL Cholesterol: 122 mg/dL — ABNORMAL HIGH (ref 0–99)
Total CHOL/HDL Ratio: 3.4 RATIO
Triglycerides: 85 mg/dL (ref <150)
VLDL: 17 mg/dL (ref 0–40)

## 2018-07-10 LAB — CBC
HCT: 41.3 % (ref 36.0–46.0)
Hemoglobin: 12.7 g/dL (ref 12.0–15.0)
MCH: 25.9 pg — AB (ref 26.0–34.0)
MCHC: 30.8 g/dL (ref 30.0–36.0)
MCV: 84.3 fL (ref 78.0–100.0)
PLATELETS: 368 10*3/uL (ref 150–400)
RBC: 4.9 MIL/uL (ref 3.87–5.11)
RDW: 15.7 % — ABNORMAL HIGH (ref 11.5–15.5)
WBC: 8.7 10*3/uL (ref 4.0–10.5)

## 2018-07-10 LAB — MRSA PCR SCREENING: MRSA BY PCR: NEGATIVE

## 2018-07-10 MED ORDER — FLUTICASONE PROPIONATE 50 MCG/ACT NA SUSP
2.0000 | Freq: Every day | NASAL | Status: DC
  Administered 2018-07-11 – 2018-07-13 (×3): 2 via NASAL
  Filled 2018-07-10: qty 16

## 2018-07-10 MED ORDER — METOPROLOL TARTRATE 12.5 MG HALF TABLET: 12.5000 mg | ORAL_TABLET | Freq: Two times a day (BID) | ORAL | Status: DC

## 2018-07-10 MED ORDER — ALBUTEROL SULFATE (2.5 MG/3ML) 0.083% IN NEBU
2.5000 mg | INHALATION_SOLUTION | RESPIRATORY_TRACT | Status: DC | PRN
Start: 2018-07-10 — End: 2018-07-13
  Administered 2018-07-10: 2.5 mg via RESPIRATORY_TRACT

## 2018-07-10 MED ORDER — LOSARTAN POTASSIUM 25 MG PO TABS
25.0000 mg | ORAL_TABLET | Freq: Every day | ORAL | Status: DC
Start: 2018-07-10 — End: 2018-07-13
  Administered 2018-07-10 – 2018-07-13 (×4): 25 mg via ORAL
  Filled 2018-07-10 (×4): qty 1

## 2018-07-10 MED ORDER — IPRATROPIUM BROMIDE 0.02 % IN SOLN
0.5000 mg | Freq: Once | RESPIRATORY_TRACT | Status: AC
  Administered 2018-07-10: 0.5 mg via RESPIRATORY_TRACT
  Filled 2018-07-10: qty 2.5

## 2018-07-10 MED ORDER — ALBUTEROL SULFATE (2.5 MG/3ML) 0.083% IN NEBU
5.0000 mg | INHALATION_SOLUTION | Freq: Once | RESPIRATORY_TRACT | Status: AC
  Administered 2018-07-10: 5 mg via RESPIRATORY_TRACT
  Filled 2018-07-10: qty 6

## 2018-07-10 MED ORDER — IPRATROPIUM-ALBUTEROL 0.5-2.5 (3) MG/3ML IN SOLN
3.0000 mL | Freq: Four times a day (QID) | RESPIRATORY_TRACT | Status: DC
  Administered 2018-07-10 – 2018-07-13 (×13): 3 mL via RESPIRATORY_TRACT
  Filled 2018-07-10 (×14): qty 3

## 2018-07-10 NOTE — Progress Notes (Signed)
Md notified of pt's still wheezing after breathing treatment.  More on the right than left.  Vs stable will continue to monitor [facility] T

## 2018-07-10 NOTE — Consult Note (Signed)
Cardiology Consultation:   Patient ID: [name]; [id number]; 05-01-1934   Admit date: 07/09/2018 Date of Consult: 07/10/2018  Primary Care Provider: Fayrene Helper, MD Primary Cardiologist: New to [place]  Primary Electrophysiologist:  None   Patient Profile:   [name] is a 84 y.o. female with a PMH of non-obstructive CAD (noted on LHC 2012), HTN, HLD, chronic back pain, asthma, depression, and obseity who is being seen today for the evaluation of chest pain at the request of Dr. Eliseo Squires.  History of Present Illness:   [name] presents with back pain, SOB, and a productive cough for the past several days. She reports SOB and productive cough for the past several days with associated pleuritic chest pain, worsening on the day of admission. She notes intermittent episodes of chest pain for the past several months which she has difficulty characterizing and occurs 2-3 times per month without aggravating/alleviating factors. She reports associated SOB with CP episodes and is unable to say how long the episodes last, but reports that they resolve spontaneously. Her last episode of chest pain was 2-3 days ago.     She reports having a remote cardiac work-up by Dr. Terrence Dupont but has not seen him since 2012. At the time of that admission in 2012, she underwent a LHC which revealed mild non-obstructive CAD (details below), with LF EF 55-60%. She has not had an ischemic evaluation since that time.   At the time of this evaluation she is chest pain free. She continues to have SOB, productive cough, and wheezing. She asks for [facility] to manage her cardiology care going forward. She reports increased stress over the past several months. She reports orthopnea with onset of present illness. She also has chronic back pain and frequent falls (last fall 2 weeks ago). She denies recent weight changes, fever, LE edema, exertional chest pain, PND.   Hospital course: intermittently  hypertensive, tachypneic, and tachycardic; satting well on RA, afebrile. Labs notable for electrolytes wnl, Cr 0.75, CBC wnl, Ddimer negative, Trop negative x3 (initial ipoc 0.04), LDL 122, BNP wnl. EKG with sinus rhythm with PAC, LVH, no STE/D, no TWI. CXR without acute findings. XR T/L spine without acute findings. She was started on steroids and nebulizers for possible bronchitis. Cardiology asked to evaluation for further work-up of chest pain.   Past Medical History:  Diagnosis Date  . Asthma   . Back pain   . Depression   . Family history of arthritis   . Family history of diabetes mellitus   . Family history of ischemic heart disease   . Headache(784.0)   . High blood pressure   . High cholesterol   . Hyperlipidemia   . Hypertension   . Obesity   . Pain from implanted hardware 01/28/2012  . Personal history of unspecified circulatory disease   . Sciatica   . Shortness of breath   . Urinary incontinence     Past Surgical History:  Procedure Laterality Date  . ABDOMINAL HYSTERECTOMY    . BACK SURGERY  2009   Dr. Ronnald Ramp [place] Nuerosurgeon  . COLONOSCOPY    . HARDWARE REMOVAL  01/28/2012   Procedure: HARDWARE REMOVAL;  Surgeon: Johnny Bridge, MD;  Location: [place];  Service: Orthopedics;  Laterality: Right;  Right Knee  . right knee surgery after fracture  2006  . SPINE SURGERY       Home Medications:  Prior to Admission medications   Medication Sig  Start Date End Date Taking? Authorizing Provider  albuterol (PROAIR HFA) 108 (90 Base) MCG/ACT inhaler inhale 2 puffs every 6 hours if needed for wheezing 01/23/18  Yes Fayrene Helper, MD  aspirin EC 81 MG tablet Take 1 tablet (81 mg total) by mouth daily. 01/05/17  Yes Fayrene Helper, MD  calcium-vitamin D (CALCIUM 500+D HIGH POTENCY) 500-400 MG-UNIT tablet Take 1 tablet by mouth daily.   Yes [provider]  fluticasone (FLONASE) 50 MCG/ACT nasal spray instill 2 sprays into each nostril  once daily Patient taking differently: Place 2 sprays into both nostrils daily as needed for allergies. instill 2 sprays into each nostril once daily 01/23/18  Yes Fayrene Helper, MD  Ibuprofen (ADVIL) 200 MG CAPS Take 2 capsules by mouth daily as needed.   Yes [provider]  pravastatin (PRAVACHOL) 10 MG tablet Take 1 tablet (10 mg total) by mouth daily. 02/01/18  Yes Fayrene Helper, MD  temazepam (RESTORIL) 7.5 MG capsule Take 1 capsule (7.5 mg total) by mouth at bedtime as needed for sleep. Patient not taking: Reported on 07/09/2018 10/14/17   Fayrene Helper, MD    Inpatient Medications: Scheduled Meds: . aspirin EC  81 mg Oral Daily  . enoxaparin (LOVENOX) injection  40 mg Subcutaneous Q24H  . guaiFENesin  600 mg Oral BID  . pravastatin  10 mg Oral Daily  . predniSONE  40 mg Oral Q breakfast   Continuous Infusions:  PRN Meds: acetaminophen, fluticasone, gi cocktail, ipratropium-albuterol, morphine injection, ondansetron (ZOFRAN) IV  Allergies:    Allergies  Allergen Reactions  . Amlodipine     Headache   . Oxycodone Nausea And Vomiting  . Penicillins Swelling  . Pravastatin Other (See Comments)    Gives pt headaches  . Tramadol Other (See Comments)    Pt "unsure"    Social History:   Social History   Socioeconomic History  . Marital status: Widowed    Spouse name: Not on file  . Number of children: 1  . Years of education: Not on file  . Highest education level: Not on file  Occupational History  . Occupation: retired   Scientific laboratory technician  . Financial resource strain: Not on file  . Food insecurity:    Worry: Not on file    Inability: Not on file  . Transportation needs:    Medical: Not on file    Non-medical: Not on file  Tobacco Use  . Smoking status: Former Smoker    Packs/day: 0.50    Years: 30.00    Pack years: 15.00    Types: Cigarettes    Last attempt to quit: 12/27/1993    Years since quitting: 24.5  . Smokeless tobacco: Never  Used  Substance and Sexual Activity  . Alcohol use: No    Alcohol/week: 0.0 oz  . Drug use: No  . Sexual activity: Not Currently  Lifestyle  . Physical activity:    Days per week: Not on file    Minutes per session: Not on file  . Stress: Not on file  Relationships  . Social connections:    Talks on phone: Not on file    Gets together: Not on file    Attends religious service: Not on file    Active member of club or organization: Not on file    Attends meetings of clubs or organizations: Not on file    Relationship status: Not on file  . Intimate partner violence:    Fear  of current or ex partner: Not on file    Emotionally abused: Not on file    Physically abused: Not on file    Forced sexual activity: Not on file  Other Topics Concern  . Not on file  Social History Narrative  . Not on file    Family History:    Family History  Problem Relation Age of Onset  . Breast cancer Mother        family history   . Hypertension Mother   . Diabetes Mother   . Hypertension Father   . Stroke Father   . Hypertension Sister   . Hypertension Brother   . Hypertension Brother   . Hypertension Brother   . Diabetes Sister   . Stroke Brother   . Heart defect Unknown        family history   . Colon cancer Neg Hx   . Colon polyps Neg Hx   . Esophageal cancer Neg Hx      ROS:  Please see the history of present illness.   [name] other ROS reviewed and negative.     Physical Exam/Data:   Vitals:   07/09/18 2038 07/09/18 2349 07/10/18 0346 07/10/18 0803  BP:  (!) 158/92 (!) 114/53 127/73  Pulse:  (!) 107 92 86  Resp:  (!) 21 18   Temp:  98.6 F (37 C) 98.7 F (37.1 C) 98.5 F (36.9 C)  TempSrc:  Oral Oral Oral  SpO2: 92% 94% 93%   Weight:      Height:        Intake/Output Summary (Last 24 hours) at 07/10/2018 0915 Last data filed at 07/10/2018 0500 Gross per 24 hour  Intake 480 ml  Output 7 ml  Net 473 ml   Filed Weights   07/09/18 1702  Weight: 246 lb 11.1 oz  (111.9 kg)   Body mass index is 42.35 kg/m.  General:  Obese AAF laying in bed in no acute distress but with audible wheezing HEENT: sclera anicteric  Neck: no JVD Endocrine:  No thryomegaly Vascular: No carotid bruits; distal pulses 2+ bilaterally Cardiac:  normal S1, S2; RRR; no murmurs, gallops, or rubs Lungs:  Scattered expiratory wheeze, no rhonchi or rales Abd: NABS, soft, obese, nontender, no hepatomegaly Ext: no edema Musculoskeletal:  No deformities, BUE and BLE strength normal and equal Skin: warm and dry  Neuro:  CNs 2-12 intact, no focal abnormalities noted Psych:  Normal affect   EKG:  The EKG was personally reviewed and demonstrates:  sinus rhythm with PAC, LVH, no STE/D, no TWI. Telemetry:  Telemetry was personally reviewed and demonstrates:  NSR with intermittent sinus tachycardia  Relevant CV Studies: Echo pending  Left heart catheterization 2012:  FINDINGS:  LV showed good LV systolic function, LVH, EF of 55-60%.  Left main was patent.  LAD has 10-15% proximal and 20-25% mid and 30-40% distal focal stenosis.  Diagonal 1 has 20-25% ostial stenosis. Diagonal 2 is very small.   Left circumflex has 20-25% proximal and mid stenosis. Left circumflex is small and tapers down in AV groove.  OM-1 is very, very small.  OM-2 is small which is patent. RCA has 20-25% mid stenosis.  PDA and PLV branches were patent. The arteriotomy was closed with ProGlide without any complications. The patient tolerated the procedure well and was transferred to recovery room in stable condition.  Laboratory Data:  Chemistry Recent Labs  Lab 07/09/18 0934 07/10/18 0607  NA 140 141  K 4.6 3.9  CL 105 107  CO2 25 22  GLUCOSE 111* 131*  BUN 10 13  CREATININE 0.75 0.71  CALCIUM 9.2 9.3  GFRNONAA >60 >60  GFRAA >60 >60  ANIONGAP 10 12    No results for input(s): PROT, ALBUMIN, AST, ALT, ALKPHOS, BILITOT in the last 168 hours. Hematology Recent Labs  Lab 07/09/18 0934  07/10/18 0607  WBC 9.3 8.7  RBC 5.25* 4.90  HGB 13.6 12.7  HCT 45.8 41.3  MCV 87.2 84.3  MCH 25.9* 25.9*  MCHC 29.7* 30.8  RDW 16.0* 15.7*  PLT 368 368   Cardiac Enzymes Recent Labs  Lab 07/09/18 1615 07/09/18 2150 07/10/18 0607  TROPONINI <0.03 <0.03 <0.03    Recent Labs  Lab 07/09/18 0937  TROPIPOC 0.04    BNP Recent Labs  Lab 07/09/18 0934  BNP 15.9    DDimer  Recent Labs  Lab 07/09/18 1615  DDIMER <0.27    Radiology/Studies:  Dg Chest 2 View  Result Date: 07/09/2018 CLINICAL DATA:  Pt reports left sided chest pain and left arm pain, reports sob and productive cough EXAM: CHEST - 2 VIEW COMPARISON:  09/15/2015 FINDINGS: Shallow lung inflation. Heart size is normal accounting for the AP position. The lungs are clear. No pulmonary edema. IMPRESSION: Shallow inflation. No evidence for acute cardiopulmonary abnormality. Electronically Signed   By: Nolon Nations [name]   On: 07/09/2018 10:13   Dg Thoracic Spine 2 View  Result Date: 07/09/2018 CLINICAL DATA:  Back pain.  Multiple recent falls. EXAM: THORACIC SPINE 2 VIEWS COMPARISON:  Chest x-ray dated September 15, 2015. FINDINGS: Twelve rib-bearing thoracic vertebral bodies. No acute fracture or subluxation. Vertebral body heights are preserved. Alignment is normal. Intervertebral disc spaces are maintained. IMPRESSION: Negative. Electronically Signed   By: Titus Dubin [name]   On: 07/09/2018 15:28   Dg Lumbar Spine 2-3 Views  Result Date: 07/09/2018 CLINICAL DATA:  Back pain.  Multiple recent falls. EXAM: LUMBAR SPINE - 2-3 VIEW COMPARISON:  Lumbar spine MRI dated September 24, 2010. FINDINGS: Unchanged transitional lumbosacral anatomy with partial sacralization of L5 on the right. No acute fracture or subluxation. Vertebral body heights are preserved. Alignment is normal. Moderate disc height loss at L5-S1 is unchanged. Remaining intervertebral disc spaces are relatively preserved. Mild to moderate facet  arthropathy throughout the lumbar spine. The sacroiliac joints are unremarkable. Aortic atherosclerosis. IMPRESSION: 1.  No acute osseous abnormality. Electronically Signed   By: Titus Dubin [name]   On: 07/09/2018 15:26    Assessment and Plan:   1. CP in patient with history of non-obstructive CAD: p/w pleuritic chest pain, SOB, and productive cough. Also reports intermittent CP episodes 2-3 times per month without aggravating/alleviating factors. Here troponin's have been negative. EKG without ischemic changes. Suspect pleuritic chest pain is more related to asthma exacerbation/ bronchitis, rather than cardiac origin. With the intermittent episodes prior to asthma exacerbation, possible these could be related to CAD and progression of disease. Risk factors for ACS include HTN, HLD, and obesity.  - Echo pending - if LV function is normal and no regional wall motion abnormalities observed, could consider outpatient ischemic work-up - Continue ASA and statin  2. Asthma exacerbation/ bronchitis: patient p/w pleuritic CP, SOB, and productive cough. Started on steroids and nebulizers given wheezing on exam. CXR without acute findings. Likely contributing to #1.  - Continue management per primary team  3. HTN: not on antihypertensives; BP stable but mildly elevated. Noted to have LVH on EKG, suggestive of overall poorly  controlled HTN. Documented intolerance to amlodipine. - Will start losartan 25mg  daily   4. HLD: LDL 122 with intermittent compliance with pravastatin  - Continue statin and encourage compliance  5. Obesity: mostly sedentary lifestyle - Continue to encourage dietary and exercise modificaitons   For questions or updates, please contact [place] Please consult [email or web address] for contact info under Cardiology/STEMI.   Signed, Abigail Butts, PA-C  07/10/2018 9:15 AM [phone]

## 2018-07-10 NOTE — Progress Notes (Signed)
Lab called to verify lab if AM labs obtained.  Pt was up to [facility] and labs not obtained at that time.  Will continue to monitor. Saunders Revel T

## 2018-07-10 NOTE — Progress Notes (Signed)
Progress Note    [name]  RDE:[id number] DOB: 11/26/1934  DOA: 07/09/2018 PCP: Fayrene Helper, MD    Brief Narrative:     Medical records reviewed and are as summarized below:  [name] is an 84 y.o. female with medical history significant of HTN, HLD, asthma, and obesity; who presents with complaints of shortness of breath and chest pain. Patient admits that same as the symptoms are not necessarily new, but worsened overnight. She has had left-sided chest pain that appears to come and go, but only lasts a few seconds and then self resolved.  Appears to be an exacerbation of asthma.    Assessment/Plan:   Principal Problem:   Chest pain Active Problems:   Hyperlipidemia   Back pain   Multiple falls   Bronchitis, asthmatic, mild intermittent, uncomplicated  Chest pain: Acute.   - cardiac enzymes x3 negative - d-dimer negative - echocardiogram pending formal read - no further cardiac work up if echo ok-- discussed with Dr. Debara Pickett   asthma exacerbation/Bronchitis -wheezing on physical exam -Given 125 mg of Solu-Medrol IV and DuoNeb breathing treatment in the ED - Chest x-ray otherwise clear. - DuoNeb's QID with albuterol PRN - Prednisone 40 mg q. A.m. - Mucinex   Back pain: Acute.   - x-rays of the thoracic and lumbar spine negative - Prednisone for suspected bronchitis may help back as well. - Tramadol as needed pain  Multiple falls, question syncopal episodes:  -PT eval  Essential hypertension: -adjust medications for better control  Hyperlipidemia:  - Continue pravastatin -LDL: 122  obesity Body mass index is 42.35 kg/m.  PT eval with home O2 study  Family Communication/Anticipated D/C date and plan/Code Status   DVT prophylaxis: Lovenox ordered. Code Status: Full Code.  Family Communication: at bedside Disposition Plan: 24-48 hours more   Medical Consultants:   cards   Subjective:   Still wheezing  Objective:     Vitals:   07/09/18 2038 07/09/18 2349 07/10/18 0346 07/10/18 0803  BP:  (!) 158/92 (!) 114/53 127/73  Pulse:  (!) 107 92 86  Resp:  (!) 21 18   Temp:  98.6 F (37 C) 98.7 F (37.1 C) 98.5 F (36.9 C)  TempSrc:  Oral Oral Oral  SpO2: 92% 94% 93%   Weight:      Height:        Intake/Output Summary (Last 24 hours) at 07/10/2018 1059 Last data filed at 07/10/2018 6314 Gross per 24 hour  Intake 480 ml  Output 8 ml  Net 472 ml   Filed Weights   07/09/18 1702  Weight: 111.9 kg (246 lb 11.1 oz)    Exam: In bed, NAD- getting echo Audible wheezing rrr Min LE Edema Obese female  Data Reviewed:   I have personally reviewed following labs and imaging studies:  Labs: Labs show the following:   Basic Metabolic Panel: Recent Labs  Lab 07/09/18 0934 07/10/18 0607  NA 140 141  K 4.6 3.9  CL 105 107  CO2 25 22  GLUCOSE 111* 131*  BUN 10 13  CREATININE 0.75 0.71  CALCIUM 9.2 9.3   GFR Estimated Creatinine Clearance: 66.4 mL/min (by C-G formula based on SCr of 0.71 mg/dL). Liver Function Tests: No results for input(s): AST, ALT, ALKPHOS, BILITOT, PROT, ALBUMIN in the last 168 hours. No results for input(s): LIPASE, AMYLASE in the last 168 hours. No results for input(s): AMMONIA in the last 168 hours. Coagulation profile No results for  input(s): INR, PROTIME in the last 168 hours.  CBC: Recent Labs  Lab 07/09/18 0934 07/10/18 0607  WBC 9.3 8.7  HGB 13.6 12.7  HCT 45.8 41.3  MCV 87.2 84.3  PLT 368 368   Cardiac Enzymes: Recent Labs  Lab 07/09/18 1615 07/09/18 2150 07/10/18 0607  TROPONINI <0.03 <0.03 <0.03   BNP (last 3 results) No results for input(s): PROBNP in the last 8760 hours. CBG: No results for input(s): GLUCAP in the last 168 hours. D-Dimer: Recent Labs    07/09/18 1615  DDIMER <0.27   Hgb A1c: No results for input(s): HGBA1C in the last 72 hours. Lipid Profile: Recent Labs    07/10/18 0607  CHOL 197  HDL 58  LDLCALC 122*   TRIG 85  CHOLHDL 3.4   Thyroid function studies: No results for input(s): TSH, T4TOTAL, T3FREE, THYROIDAB in the last 72 hours.  Invalid input(s): FREET3 Anemia work up: No results for input(s): VITAMINB12, FOLATE, FERRITIN, TIBC, IRON, RETICCTPCT in the last 72 hours. Sepsis Labs: Recent Labs  Lab 07/09/18 0934 07/10/18 0607  WBC 9.3 8.7    Microbiology Recent Results (from the past 240 hour(s))  MRSA PCR Screening     Status: None   Collection Time: 07/09/18 11:56 PM  Result Value Ref Range Status   MRSA by PCR NEGATIVE NEGATIVE Final    Comment:        The GeneXpert MRSA Assay (FDA approved for NASAL specimens only), is one component of a comprehensive MRSA colonization surveillance program. It is not intended to diagnose MRSA infection nor to guide or monitor treatment for MRSA infections. Performed at [facility], [place] [address] [place], [place] [address]     Procedures and diagnostic studies:  Dg Chest 2 View  Result Date: 07/09/2018 CLINICAL DATA:  Pt reports left sided chest pain and left arm pain, reports sob and productive cough EXAM: CHEST - 2 VIEW COMPARISON:  09/15/2015 FINDINGS: Shallow lung inflation. Heart size is normal accounting for the AP position. The lungs are clear. No pulmonary edema. IMPRESSION: Shallow inflation. No evidence for acute cardiopulmonary abnormality. Electronically Signed   By: Nolon Nations [name]   On: 07/09/2018 10:13   Dg Thoracic Spine 2 View  Result Date: 07/09/2018 CLINICAL DATA:  Back pain.  Multiple recent falls. EXAM: THORACIC SPINE 2 VIEWS COMPARISON:  Chest x-ray dated September 15, 2015. FINDINGS: Twelve rib-bearing thoracic vertebral bodies. No acute fracture or subluxation. Vertebral body heights are preserved. Alignment is normal. Intervertebral disc spaces are maintained. IMPRESSION: Negative. Electronically Signed   By: Titus Dubin [name]   On: 07/09/2018 15:28   Dg Lumbar Spine 2-3 Views  Result  Date: 07/09/2018 CLINICAL DATA:  Back pain.  Multiple recent falls. EXAM: LUMBAR SPINE - 2-3 VIEW COMPARISON:  Lumbar spine MRI dated September 24, 2010. FINDINGS: Unchanged transitional lumbosacral anatomy with partial sacralization of L5 on the right. No acute fracture or subluxation. Vertebral body heights are preserved. Alignment is normal. Moderate disc height loss at L5-S1 is unchanged. Remaining intervertebral disc spaces are relatively preserved. Mild to moderate facet arthropathy throughout the lumbar spine. The sacroiliac joints are unremarkable. Aortic atherosclerosis. IMPRESSION: 1.  No acute osseous abnormality. Electronically Signed   By: Titus Dubin [name]   On: 07/09/2018 15:26    Medications:   . aspirin EC  81 mg Oral Daily  . enoxaparin (LOVENOX) injection  40 mg Subcutaneous Q24H  . [START ON 07/11/2018] fluticasone  2 spray Each Nare Daily  .  guaiFENesin  600 mg Oral BID  . ipratropium-albuterol  3 mL Nebulization Q6H  . losartan  25 mg Oral Daily  . pravastatin  10 mg Oral Daily  . predniSONE  40 mg Oral Q breakfast   Continuous Infusions:   LOS: 0 days   Geradine Girt  [facility]   *Please refer to [email or web address], password [email or web address] to get updated schedule on who will round on this patient, as hospitalists switch teams weekly. If 7PM-7AM, please contact night-coverage at [email or web address], password [email or web address] for any overnight needs.  07/10/2018, 10:59 AM

## 2018-07-11 ENCOUNTER — Observation Stay (HOSPITAL_COMMUNITY): Payer: Medicare Other

## 2018-07-11 DIAGNOSIS — R0602 Shortness of breath: Secondary | ICD-10-CM | POA: Diagnosis not present

## 2018-07-11 DIAGNOSIS — R0789 Other chest pain: Secondary | ICD-10-CM | POA: Diagnosis present

## 2018-07-11 DIAGNOSIS — Z7982 Long term (current) use of aspirin: Secondary | ICD-10-CM | POA: Diagnosis not present

## 2018-07-11 DIAGNOSIS — R05 Cough: Secondary | ICD-10-CM | POA: Diagnosis not present

## 2018-07-11 DIAGNOSIS — E559 Vitamin D deficiency, unspecified: Secondary | ICD-10-CM | POA: Diagnosis present

## 2018-07-11 DIAGNOSIS — J4520 Mild intermittent asthma, uncomplicated: Secondary | ICD-10-CM | POA: Diagnosis not present

## 2018-07-11 DIAGNOSIS — M549 Dorsalgia, unspecified: Secondary | ICD-10-CM | POA: Diagnosis not present

## 2018-07-11 DIAGNOSIS — Z7951 Long term (current) use of inhaled steroids: Secondary | ICD-10-CM | POA: Diagnosis not present

## 2018-07-11 DIAGNOSIS — Z885 Allergy status to narcotic agent status: Secondary | ICD-10-CM | POA: Diagnosis not present

## 2018-07-11 DIAGNOSIS — E7849 Other hyperlipidemia: Secondary | ICD-10-CM | POA: Diagnosis not present

## 2018-07-11 DIAGNOSIS — R079 Chest pain, unspecified: Secondary | ICD-10-CM | POA: Diagnosis not present

## 2018-07-11 DIAGNOSIS — Z6841 Body Mass Index (BMI) 40.0 and over, adult: Secondary | ICD-10-CM | POA: Diagnosis not present

## 2018-07-11 DIAGNOSIS — E785 Hyperlipidemia, unspecified: Secondary | ICD-10-CM | POA: Diagnosis present

## 2018-07-11 DIAGNOSIS — M545 Low back pain: Secondary | ICD-10-CM | POA: Diagnosis present

## 2018-07-11 DIAGNOSIS — I2510 Atherosclerotic heart disease of native coronary artery without angina pectoris: Secondary | ICD-10-CM | POA: Diagnosis present

## 2018-07-11 DIAGNOSIS — Z880 Allergy status to penicillin: Secondary | ICD-10-CM | POA: Diagnosis not present

## 2018-07-11 DIAGNOSIS — J441 Chronic obstructive pulmonary disease with (acute) exacerbation: Secondary | ICD-10-CM | POA: Diagnosis not present

## 2018-07-11 DIAGNOSIS — G8929 Other chronic pain: Secondary | ICD-10-CM | POA: Diagnosis present

## 2018-07-11 DIAGNOSIS — Z888 Allergy status to other drugs, medicaments and biological substances status: Secondary | ICD-10-CM | POA: Diagnosis not present

## 2018-07-11 DIAGNOSIS — J4521 Mild intermittent asthma with (acute) exacerbation: Secondary | ICD-10-CM | POA: Diagnosis present

## 2018-07-11 DIAGNOSIS — I10 Essential (primary) hypertension: Secondary | ICD-10-CM | POA: Diagnosis present

## 2018-07-11 DIAGNOSIS — I491 Atrial premature depolarization: Secondary | ICD-10-CM | POA: Diagnosis present

## 2018-07-11 DIAGNOSIS — R296 Repeated falls: Secondary | ICD-10-CM | POA: Diagnosis present

## 2018-07-11 DIAGNOSIS — Z79899 Other long term (current) drug therapy: Secondary | ICD-10-CM | POA: Diagnosis not present

## 2018-07-11 DIAGNOSIS — Z87891 Personal history of nicotine dependence: Secondary | ICD-10-CM | POA: Diagnosis not present

## 2018-07-11 DIAGNOSIS — F329 Major depressive disorder, single episode, unspecified: Secondary | ICD-10-CM | POA: Diagnosis present

## 2018-07-11 LAB — BASIC METABOLIC PANEL
Anion gap: 12 (ref 5–15)
BUN: 18 mg/dL (ref 8–23)
CHLORIDE: 107 mmol/L (ref 98–111)
CO2: 24 mmol/L (ref 22–32)
CREATININE: 0.82 mg/dL (ref 0.44–1.00)
Calcium: 9.1 mg/dL (ref 8.9–10.3)
GFR calc Af Amer: >60 mL/min (ref >60)
GFR calc non Af Amer: >60 mL/min (ref >60)
GLUCOSE: 113 mg/dL — AB (ref 70–99)
Potassium: 3.7 mmol/L (ref 3.5–5.1)
SODIUM: 143 mmol/L (ref 135–145)

## 2018-07-11 LAB — CBC
HEMATOCRIT: 41.9 % (ref 36.0–46.0)
Hemoglobin: 12.7 g/dL (ref 12.0–15.0)
MCH: 25.5 pg — ABNORMAL LOW (ref 26.0–34.0)
MCHC: 30.3 g/dL (ref 30.0–36.0)
MCV: 84 fL (ref 78.0–100.0)
Platelets: 357 10*3/uL (ref 150–400)
RBC: 4.99 MIL/uL (ref 3.87–5.11)
RDW: 15.7 % — ABNORMAL HIGH (ref 11.5–15.5)
WBC: 10.8 10*3/uL — AB (ref 4.0–10.5)

## 2018-07-11 MED ORDER — SODIUM CHLORIDE 0.9 % IV SOLN
INTRAVENOUS | Status: DC
  Administered 2018-07-11: 10:00:00 via INTRAVENOUS

## 2018-07-11 MED ORDER — SODIUM CHLORIDE 0.9 % IV SOLN
100.0000 mg | Freq: Two times a day (BID) | INTRAVENOUS | Status: DC
  Administered 2018-07-11 – 2018-07-12 (×3): 100 mg via INTRAVENOUS
  Filled 2018-07-11 (×3): qty 100

## 2018-07-11 MED ORDER — GUAIFENESIN-DM 100-10 MG/5ML PO SYRP
15.0000 mL | ORAL_SOLUTION | ORAL | Status: DC | PRN
  Administered 2018-07-11 – 2018-07-12 (×4): 15 mL via ORAL
  Filled 2018-07-11 (×5): qty 15

## 2018-07-11 MED ORDER — LORATADINE 10 MG PO TABS
10.0000 mg | ORAL_TABLET | Freq: Every day | ORAL | Status: DC
  Administered 2018-07-11 – 2018-07-13 (×3): 10 mg via ORAL
  Filled 2018-07-11 (×3): qty 1

## 2018-07-11 MED ORDER — BUDESONIDE 0.25 MG/2ML IN SUSP
0.2500 mg | Freq: Two times a day (BID) | RESPIRATORY_TRACT | Status: DC
  Administered 2018-07-11 – 2018-07-13 (×5): 0.25 mg via RESPIRATORY_TRACT
  Filled 2018-07-11 (×4): qty 2

## 2018-07-11 NOTE — Care Management Obs Status (Signed)
[place] NOTIFICATION   Patient Details  Name: LAEL WETHERBEE MRN: [id number] Date of Birth: 02/22/1934   Medicare Observation Status Notification Given:  Yes    Maryclare Labrador, RN 07/11/2018, 11:24 AM

## 2018-07-11 NOTE — Care Management Note (Signed)
Case Management Note  Patient Details  Name: [name] MRN: [id number] Date of Birth: 06-Sep-1934  Subjective/Objective:    Pt admitted with CP                Action/Plan:  PTA independent from home with adult grand children.   Pt has PCP and denied barriers with obtaining/paying for medications.  Pt informed CM that PTA she was active with [facility] for HHPT - CM informed liaison of admit and that orders are in - agency will resume services for pt at discharge   Expected Discharge Date:                  Expected Discharge Plan:  Home/Self Care(independent from home, has PCP and  denied barriers with paying for medications)  In-House Referral:     Discharge planning Services  CM Consult  Post Acute Care Choice:    Choice offered to:  Patient  DME Arranged:    DME Agency:     HH Arranged:  PT HH Agency:  [place]  Status of Service:  In process, will continue to follow  If discussed at Long Length of Stay Meetings, dates discussed:    Additional Comments:  Maryclare Labrador, RN 07/11/2018, 1:20 PM

## 2018-07-11 NOTE — Progress Notes (Signed)
RT Note: Patient was instructed on proper flutter valve use. She demonstrated the technique well with no complications. She was able to cough up a small amount of secretions post flutter valve use. She is able to use it independently with no issues. She has no questions presently. Rt will continue to monitor.

## 2018-07-11 NOTE — Evaluation (Signed)
Physical Therapy Evaluation Patient Details Name: [name] MRN: [id number] DOB: 03/19/1934 Today's Date: 07/11/2018   History of Present Illness  Pt is an 84 y.o. female admitted 07/09/18 with c/o SOB and chest pain. Worked up for asthma exacerbation/bronchitis. Also with c/o acute back pain; xray negative. PMH includes HTN, asthma, obesity.    Clinical Impression  Pt presents with an overall decrease in functional mobility secondary to above. PTA, pt mod indep for household distances using SPC, relies on RW/rollator for community ambulation; lives with grandchildren who work. Today, pt able to amb short distance with RW and supervision. SpO2 >92% on RA throughout, although periodically coughing/wheezing. Pt would benefit from continued acute PT services to maximize functional mobility and independence prior to d/c with HHPT services.     Follow Up Recommendations Home health PT;Supervision - Intermittent    Equipment Recommendations  None recommended by PT    Recommendations for Other Services       Precautions / Restrictions Precautions Precautions: Fall Restrictions Weight Bearing Restrictions: No      Mobility  Bed Mobility               General bed mobility comments: Received sitting EOB  Transfers Overall transfer level: Needs assistance Equipment used: Rolling walker (2 wheeled) Transfers: Sit to/from Stand Sit to Stand: Supervision         General transfer comment: Cues for correct hand placement  Ambulation/Gait Ambulation/Gait assistance: Supervision Gait Distance (Feet): 10 Feet Assistive device: Rolling walker (2 wheeled) Gait Pattern/deviations: Step-through pattern;Decreased stride length;Wide base of support Gait velocity: Decreased Gait velocity interpretation: <1.8 ft/sec, indicate of risk for recurrent falls General Gait Details: Slow, controlled amb with RW and supervision for safety. SpO2 96% on RA  Stairs             Wheelchair Mobility    Modified Rankin (Stroke Patients Only)       Balance Overall balance assessment: Needs assistance   Sitting balance-Leahy Scale: Good Sitting balance - Comments: Indep to don socks sitting EOB     Standing balance-Leahy Scale: Fair Standing balance comment: Can static stand with no UE support; dynamic stability improved with UE support                             Pertinent Vitals/Pain Pain Assessment: No/denies pain    Home Living Family/patient expects to be discharged to:: Private residence Living Arrangements: Other relatives(grandchildren) Available Help at Discharge: Family;Available PRN/intermittently Type of Home: House Home Access: Stairs to enter Entrance Stairs-Rails: Psychiatric nurse of Steps: 10 Home Layout: One level Home Equipment: Walker - 2 wheels;Walker - 4 wheels;Cane - single point;Bedside commode;Shower seat      Prior Function Level of Independence: Independent         Comments: Primarily household distances with SPC. Only leaves house for church or shopping because 10 steps in/out gives her trouble; uses RW/rollator for community ambulation. Daughter drives     Hand Dominance        Extremity/Trunk Assessment   Upper Extremity Assessment Upper Extremity Assessment: Overall WFL for tasks assessed    Lower Extremity Assessment Lower Extremity Assessment: Generalized weakness       Communication   Communication: No difficulties  Cognition Arousal/Alertness: Awake/alert Behavior During Therapy: WFL for tasks assessed/performed Overall Cognitive Status: Within Functional Limits for tasks assessed  General Comments      Exercises     Assessment/Plan    PT Assessment Patient needs continued PT services  PT Problem List Decreased strength;Decreased activity tolerance;Decreased balance;Decreased mobility;Cardiopulmonary  status limiting activity       PT Treatment Interventions DME instruction;Gait training;Stair training;Functional mobility training;Therapeutic activities;Therapeutic exercise;Balance training;Patient/family education    PT Goals (Current goals can be found in the Care Plan section)  Acute Rehab PT Goals Patient Stated Goal: Return home, breathe better PT Goal Formulation: With patient Time For Goal Achievement: 07/25/18 Potential to Achieve Goals: Good    Frequency Min 3X/week   Barriers to discharge        Co-evaluation               AM-PAC PT "6 Clicks" Daily Activity  Outcome Measure Difficulty turning over in bed (including adjusting bedclothes, sheets and blankets)?: None Difficulty moving from lying on back to sitting on the side of the bed? : None Difficulty sitting down on and standing up from a chair with arms (e.g., wheelchair, bedside commode, etc,.)?: A Little Help needed moving to and from a bed to chair (including a wheelchair)?: A Little Help needed walking in hospital room?: A Little Help needed climbing 3-5 steps with a railing? : A Little 6 Click Score: 20    End of Session Equipment Utilized During Treatment: Gait belt Activity Tolerance: Patient tolerated treatment well Patient left: with call bell/phone within reach(seated on BSC for BM (NT aware)) Nurse Communication: Mobility status PT Visit Diagnosis: Other abnormalities of gait and mobility (R26.89)    Time: 4128-7867 PT Time Calculation (min) (ACUTE ONLY): 22 min   Charges:   PT Evaluation $PT Eval Moderate Complexity: 1 Mod     PT G Codes:       Mabeline Caras, PT, DPT Acute Rehab Services  Pager: [place] 07/11/2018, 11:02 AM

## 2018-07-11 NOTE — Progress Notes (Signed)
Progress Note    [name]  ENI:[id number] DOB: 1934-04-03  DOA: 07/09/2018 PCP: Fayrene Helper, MD    Brief Narrative:     Medical records reviewed and are as summarized below:  [name] is an 84 y.o. female with medical history significant of HTN, HLD, asthma, and obesity; who presents with complaints of shortness of breath and chest pain. Patient admits that same as the symptoms are not necessarily new, but worsened overnight. She has had left-sided chest pain that appears to come and go, but only lasts a few seconds and then self resolved.  Appears to be an exacerbation of asthma.    Assessment/Plan:   Principal Problem:   Chest pain Active Problems:   Hyperlipidemia   Back pain   Multiple falls   Bronchitis, asthmatic, mild intermittent, uncomplicated  Chest pain: Acute.   - cardiac enzymes x3 negative - d-dimer negative - echocardiogram:Vigorous LV systolic function; mild diastolic dysfunction;   moderate LVH  - no further cardiac work up if echo ok-- discussed with Dr. Debara Pickett   asthma exacerbation/Bronchitis -continues to wheezing in most lung fields -Given 125 mg of Solu-Medrol IV and DuoNeb breathing treatment in the ED - Chest x-ray otherwise clear. - DuoNeb's QID with albuterol PRN - Prednisone 40 mg - Mucinex -add abx   Back pain: Acute.   - x-rays of the thoracic and lumbar spine negative - Prednisone for suspected bronchitis may help back as well. - Tramadol as needed pain  Multiple falls, question syncopal episodes:  -PT eval- home health  Essential hypertension: -adjust medications for better control  Hyperlipidemia:  - Continue pravastatin -LDL: 122  obesity Body mass index is 42.35 kg/m.  If patient's wheezing has improved in AM, plan for d/c home  Family Communication/Anticipated D/C date and plan/Code Status   DVT prophylaxis: Lovenox ordered. Code Status: Full Code.  Family Communication: at bedside  7/15 Disposition Plan: 24 more hours   Medical Consultants:   cards   Subjective:   Coughing and wheezing worse today  Objective:    Vitals:   07/11/18 0325 07/11/18 0715 07/11/18 0746 07/11/18 1248  BP: (!) 145/74 (!) 145/82  123/74  Pulse:      Resp:      Temp: 98.7 F (37.1 C) 97.6 F (36.4 C)  98.8 F (37.1 C)  TempSrc: Oral Oral  Oral  SpO2: 95%  96%   Weight:      Height:        Intake/Output Summary (Last 24 hours) at 07/11/2018 1348 Last data filed at 07/11/2018 1030 Gross per 24 hour  Intake 942 ml  Output 626 ml  Net 316 ml   Filed Weights   07/09/18 1702  Weight: 111.9 kg (246 lb 11.1 oz)    Exam: In bed, audible wheezing from door wheezing in lung fields rrr Min LE edema No rash  Data Reviewed:   I have personally reviewed following labs and imaging studies:  Labs: Labs show the following:   Basic Metabolic Panel: Recent Labs  Lab 07/09/18 0934 07/10/18 0607 07/11/18 0358  NA 140 141 143  K 4.6 3.9 3.7  CL 105 107 107  CO2 25 22 24   GLUCOSE 111* 131* 113*  BUN 10 13 18   CREATININE 0.75 0.71 0.82  CALCIUM 9.2 9.3 9.1   GFR Estimated Creatinine Clearance: 64.8 mL/min (by C-G formula based on SCr of 0.82 mg/dL). Liver Function Tests: No results for input(s): AST, ALT, ALKPHOS, BILITOT,  PROT, ALBUMIN in the last 168 hours. No results for input(s): LIPASE, AMYLASE in the last 168 hours. No results for input(s): AMMONIA in the last 168 hours. Coagulation profile No results for input(s): INR, PROTIME in the last 168 hours.  CBC: Recent Labs  Lab 07/09/18 0934 07/10/18 0607 07/11/18 0358  WBC 9.3 8.7 10.8*  HGB 13.6 12.7 12.7  HCT 45.8 41.3 41.9  MCV 87.2 84.3 84.0  PLT 368 368 357   Cardiac Enzymes: Recent Labs  Lab 07/09/18 1615 07/09/18 2150 07/10/18 0607  TROPONINI <0.03 <0.03 <0.03   BNP (last 3 results) No results for input(s): PROBNP in the last 8760 hours. CBG: No results for input(s): GLUCAP in the last  168 hours. D-Dimer: Recent Labs    07/09/18 1615  DDIMER <0.27   Hgb A1c: No results for input(s): HGBA1C in the last 72 hours. Lipid Profile: Recent Labs    07/10/18 0607  CHOL 197  HDL 58  LDLCALC 122*  TRIG 85  CHOLHDL 3.4   Thyroid function studies: No results for input(s): TSH, T4TOTAL, T3FREE, THYROIDAB in the last 72 hours.  Invalid input(s): FREET3 Anemia work up: No results for input(s): VITAMINB12, FOLATE, FERRITIN, TIBC, IRON, RETICCTPCT in the last 72 hours. Sepsis Labs: Recent Labs  Lab 07/09/18 0934 07/10/18 0607 07/11/18 0358  WBC 9.3 8.7 10.8*    Microbiology Recent Results (from the past 240 hour(s))  MRSA PCR Screening     Status: None   Collection Time: 07/09/18 11:56 PM  Result Value Ref Range Status   MRSA by PCR NEGATIVE NEGATIVE Final    Comment:        The GeneXpert MRSA Assay (FDA approved for NASAL specimens only), is one component of a comprehensive MRSA colonization surveillance program. It is not intended to diagnose MRSA infection nor to guide or monitor treatment for MRSA infections. Performed at [facility], [place] [address] [place], [place] [address]     Procedures and diagnostic studies:  Dg Thoracic Spine 2 View  Result Date: 07/09/2018 CLINICAL DATA:  Back pain.  Multiple recent falls. EXAM: THORACIC SPINE 2 VIEWS COMPARISON:  Chest x-ray dated September 15, 2015. FINDINGS: Twelve rib-bearing thoracic vertebral bodies. No acute fracture or subluxation. Vertebral body heights are preserved. Alignment is normal. Intervertebral disc spaces are maintained. IMPRESSION: Negative. Electronically Signed   By: Titus Dubin [name]   On: 07/09/2018 15:28   Dg Lumbar Spine 2-3 Views  Result Date: 07/09/2018 CLINICAL DATA:  Back pain.  Multiple recent falls. EXAM: LUMBAR SPINE - 2-3 VIEW COMPARISON:  Lumbar spine MRI dated September 24, 2010. FINDINGS: Unchanged transitional lumbosacral anatomy with partial sacralization of  L5 on the right. No acute fracture or subluxation. Vertebral body heights are preserved. Alignment is normal. Moderate disc height loss at L5-S1 is unchanged. Remaining intervertebral disc spaces are relatively preserved. Mild to moderate facet arthropathy throughout the lumbar spine. The sacroiliac joints are unremarkable. Aortic atherosclerosis. IMPRESSION: 1.  No acute osseous abnormality. Electronically Signed   By: Titus Dubin [name]   On: 07/09/2018 15:26   Dg Chest Port 1 View  Result Date: 07/11/2018 CLINICAL DATA:  Cough, shortness of breath EXAM: PORTABLE CHEST 1 VIEW COMPARISON:  07/09/2018 FINDINGS: Mild bilateral interstitial prominence unchanged from the prior exam. There is no focal parenchymal opacity. There is no pleural effusion or pneumothorax. The heart and mediastinal contours are unremarkable. The osseous structures are unremarkable. IMPRESSION: No active disease. Electronically Signed   By: Elbert Ewings  Patel   On: 07/11/2018 08:40    Medications:   . aspirin EC  81 mg Oral Daily  . budesonide (PULMICORT) nebulizer solution  0.25 mg Nebulization BID  . enoxaparin (LOVENOX) injection  40 mg Subcutaneous Q24H  . fluticasone  2 spray Each Nare Daily  . guaiFENesin  600 mg Oral BID  . ipratropium-albuterol  3 mL Nebulization Q6H  . losartan  25 mg Oral Daily  . pravastatin  10 mg Oral Daily  . predniSONE  40 mg Oral Q breakfast   Continuous Infusions: . doxycycline (VIBRAMYCIN) IV 100 mg (07/11/18 0952)     LOS: 0 days   Geradine Girt  [facility]   *Please refer to [email or web address], password [email or web address] to get updated schedule on who will round on this patient, as hospitalists switch teams weekly. If 7PM-7AM, please contact night-coverage at [email or web address], password [email or web address] for any overnight needs.  07/11/2018, 1:48 PM

## 2018-07-12 MED ORDER — DOXYCYCLINE HYCLATE 100 MG PO TABS
100.0000 mg | ORAL_TABLET | Freq: Two times a day (BID) | ORAL | Status: DC
  Administered 2018-07-12 – 2018-07-13 (×2): 100 mg via ORAL
  Filled 2018-07-12 (×2): qty 1

## 2018-07-12 MED ORDER — METOPROLOL TARTRATE 5 MG/5ML IV SOLN: 5.0000 mg | Freq: Once | INTRAVENOUS | Status: DC

## 2018-07-12 NOTE — Plan of Care (Signed)
Continue with plan of care.  

## 2018-07-12 NOTE — Progress Notes (Signed)
Progress Note    [name]  YIR:[id number] DOB: 09-Jun-1934  DOA: 07/09/2018 PCP: Fayrene Helper, MD    Brief Narrative:     Medical records reviewed and are as summarized below:  [name] is an 84 y.o. female with medical history significant of HTN, HLD, asthma, and obesity; who presents with complaints of shortness of breath and chest pain. Patient admits that same as the symptoms are not necessarily new, but worsened overnight. She has had left-sided chest pain that appears to come and go, but only lasts a few seconds and then self resolved.  Appears to be an exacerbation of asthma.    Assessment/Plan:   Principal Problem:   Chest pain Active Problems:   Hyperlipidemia   Back pain   Multiple falls   Bronchitis, asthmatic, mild intermittent, uncomplicated  Chest pain: Acute.   - cardiac enzymes x3 negative - d-dimer negative - echocardiogram:Vigorous LV systolic function; mild diastolic dysfunction;   moderate LVH  - no further cardiac work up per cardiology   asthma exacerbation/Bronchitis -continues to wheezing in most lung fields -Given 125 mg of Solu-Medrol IV and DuoNeb breathing treatment in the ED - Chest x-ray otherwise clear. - DuoNeb's QID with albuterol PRN - Prednisone 40 mg x 5 days - Mucinex -add abx 7/16 with improvement   Back pain: Acute.   - x-rays of the thoracic and lumbar spine negative - Prednisone for suspected bronchitis may help back as well. - Tramadol as needed pain  Multiple falls, question syncopal episodes:  -PT eval- home health  Essential hypertension: -adjust medications for better control  Hyperlipidemia:  - Continue pravastatin -LDL: 122  obesity Body mass index is 42.35 kg/m.    Family Communication/Anticipated D/C date and plan/Code Status   DVT prophylaxis: Lovenox ordered. Code Status: Full Code.  Family Communication: at bedside 7/15 Disposition Plan: home in AM   Medical  Consultants:   cards   Subjective:   Wheezing improved and overall starting to feel better  Objective:    Vitals:   07/12/18 0320 07/12/18 0716 07/12/18 0720 07/12/18 1046  BP: (!) 153/92 (!) 142/68  128/83  Pulse: (!) 118 96  81  Resp:  20    Temp: 99.8 F (37.7 C) 99.2 F (37.3 C)  98.8 F (37.1 C)  TempSrc: Oral Oral  Oral  SpO2: 94% 91% 91% 94%  Weight:      Height:        Intake/Output Summary (Last 24 hours) at 07/12/2018 1409 Last data filed at 07/12/2018 1400 Gross per 24 hour  Intake 930 ml  Output 850 ml  Net 80 ml   Filed Weights   07/09/18 1702  Weight: 111.9 kg (246 lb 11.1 oz)    Exam: No audible wheezes from door but does have a few on exam rrr Min LE edema +BS, soft A+Ox3  Data Reviewed:   I have personally reviewed following labs and imaging studies:  Labs: Labs show the following:   Basic Metabolic Panel: Recent Labs  Lab 07/09/18 0934 07/10/18 0607 07/11/18 0358  NA 140 141 143  K 4.6 3.9 3.7  CL 105 107 107  CO2 25 22 24   GLUCOSE 111* 131* 113*  BUN 10 13 18   CREATININE 0.75 0.71 0.82  CALCIUM 9.2 9.3 9.1   GFR Estimated Creatinine Clearance: 64.8 mL/min (by C-G formula based on SCr of 0.82 mg/dL). Liver Function Tests: No results for input(s): AST, ALT, ALKPHOS, BILITOT, PROT, ALBUMIN  in the last 168 hours. No results for input(s): LIPASE, AMYLASE in the last 168 hours. No results for input(s): AMMONIA in the last 168 hours. Coagulation profile No results for input(s): INR, PROTIME in the last 168 hours.  CBC: Recent Labs  Lab 07/09/18 0934 07/10/18 0607 07/11/18 0358  WBC 9.3 8.7 10.8*  HGB 13.6 12.7 12.7  HCT 45.8 41.3 41.9  MCV 87.2 84.3 84.0  PLT 368 368 357   Cardiac Enzymes: Recent Labs  Lab 07/09/18 1615 07/09/18 2150 07/10/18 0607  TROPONINI <0.03 <0.03 <0.03   BNP (last 3 results) No results for input(s): PROBNP in the last 8760 hours. CBG: No results for input(s): GLUCAP in the last 168  hours. D-Dimer: Recent Labs    07/09/18 1615  DDIMER <0.27   Hgb A1c: No results for input(s): HGBA1C in the last 72 hours. Lipid Profile: Recent Labs    07/10/18 0607  CHOL 197  HDL 58  LDLCALC 122*  TRIG 85  CHOLHDL 3.4   Thyroid function studies: No results for input(s): TSH, T4TOTAL, T3FREE, THYROIDAB in the last 72 hours.  Invalid input(s): FREET3 Anemia work up: No results for input(s): VITAMINB12, FOLATE, FERRITIN, TIBC, IRON, RETICCTPCT in the last 72 hours. Sepsis Labs: Recent Labs  Lab 07/09/18 0934 07/10/18 0607 07/11/18 0358  WBC 9.3 8.7 10.8*    Microbiology Recent Results (from the past 240 hour(s))  MRSA PCR Screening     Status: None   Collection Time: 07/09/18 11:56 PM  Result Value Ref Range Status   MRSA by PCR NEGATIVE NEGATIVE Final    Comment:        The GeneXpert MRSA Assay (FDA approved for NASAL specimens only), is one component of a comprehensive MRSA colonization surveillance program. It is not intended to diagnose MRSA infection nor to guide or monitor treatment for MRSA infections. Performed at [facility], [place] [address] [place], [place] [address]     Procedures and diagnostic studies:  Dg Chest Port 1 View  Result Date: 07/11/2018 CLINICAL DATA:  Cough, shortness of breath EXAM: PORTABLE CHEST 1 VIEW COMPARISON:  07/09/2018 FINDINGS: Mild bilateral interstitial prominence unchanged from the prior exam. There is no focal parenchymal opacity. There is no pleural effusion or pneumothorax. The heart and mediastinal contours are unremarkable. The osseous structures are unremarkable. IMPRESSION: No active disease. Electronically Signed   By: Kathreen Devoid   On: 07/11/2018 08:40    Medications:   . aspirin EC  81 mg Oral Daily  . budesonide (PULMICORT) nebulizer solution  0.25 mg Nebulization BID  . doxycycline  100 mg Oral Q12H  . enoxaparin (LOVENOX) injection  40 mg Subcutaneous Q24H  . fluticasone  2 spray Each Nare  Daily  . guaiFENesin  600 mg Oral BID  . ipratropium-albuterol  3 mL Nebulization Q6H  . loratadine  10 mg Oral Daily  . losartan  25 mg Oral Daily  . metoprolol tartrate  5 mg Intravenous Once  . pravastatin  10 mg Oral Daily  . predniSONE  40 mg Oral Q breakfast   Continuous Infusions:    LOS: 1 day   Geradine Girt  [facility]   *Please refer to [email or web address], password [email or web address] to get updated schedule on who will round on this patient, as hospitalists switch teams weekly. If 7PM-7AM, please contact night-coverage at [email or web address], password [email or web address] for any overnight needs.  07/12/2018, 2:09 PM

## 2018-07-12 NOTE — Progress Notes (Signed)
Physical Therapy Treatment Patient Details Name: [name] MRN: [id number] DOB: [birth year] Today's Date: 07/12/2018    History of Present Illness Pt is an 84 y.o. female admitted 07/09/18 with c/o SOB and chest pain. Worked up for asthma exacerbation/bronchitis. Also with c/o acute back pain; xray negative. PMH includes HTN, asthma, obesity.    PT Comments    Patient progressing with therapy, ambulating into hallway with rollator 180' on RA. SpO2 WNL throughout session, HR ranged 100-135, no c/o of chest pain during visit. Pt denied stairs citing fatigue, will re-attempt as schedule permits in preparation for safe return home, 10 stairs.     Follow Up Recommendations  Home health PT;Supervision - Intermittent     Equipment Recommendations  None recommended by PT    Recommendations for Other Services       Precautions / Restrictions Restrictions Weight Bearing Restrictions: No    Mobility  Bed Mobility               General bed mobility comments: OOB upon arrival   Transfers Overall transfer level: Needs assistance Equipment used: 4-wheeled walker Transfers: Sit to/from Stand Sit to Stand: Supervision            Ambulation/Gait Ambulation/Gait assistance: Supervision Gait Distance (Feet): 150 Feet Assistive device: 4-wheeled walker Gait Pattern/deviations: Step-through pattern;Decreased stride length;Wide base of support Gait velocity: decreased   General Gait Details: SpO2 WNL on RA, utilizing Rollator. HR max 135, declines stairs today. will re-attempt later today. no rest breaks needed, DOE 2/4   Marine scientist Rankin (Stroke Patients Only)       Balance Overall balance assessment: Needs assistance   Sitting balance-Leahy Scale: Good       Standing balance-Leahy Scale: Fair Standing balance comment: Can static stand with no UE support; dynamic stability improved with UE support                             Cognition Arousal/Alertness: Awake/alert Behavior During Therapy: WFL for tasks assessed/performed Overall Cognitive Status: Within Functional Limits for tasks assessed                                        Exercises      General Comments        Pertinent Vitals/Pain Pain Assessment: Faces Pain Score: 0-No pain    Home Living                      Prior Function            PT Goals (current goals can now be found in the care plan section) Acute Rehab PT Goals Patient Stated Goal: Return home, breathe better PT Goal Formulation: With patient Time For Goal Achievement: 07/25/18 Potential to Achieve Goals: Good Progress towards PT goals: Progressing toward goals    Frequency    Min 3X/week      PT Plan Current plan remains appropriate    Co-evaluation              AM-PAC PT "6 Clicks" Daily Activity  Outcome Measure  Difficulty turning over in bed (including adjusting bedclothes, sheets and blankets)?: None Difficulty moving from lying on back to sitting on the side of the bed? :  None Difficulty sitting down on and standing up from a chair with arms (e.g., wheelchair, bedside commode, etc,.)?: None Help needed moving to and from a bed to chair (including a wheelchair)?: A Little Help needed walking in hospital room?: A Little Help needed climbing 3-5 steps with a railing? : A Little 6 Click Score: 21    End of Session Equipment Utilized During Treatment: Gait belt Activity Tolerance: Patient tolerated treatment well Patient left: with call bell/phone within reach Nurse Communication: Mobility status PT Visit Diagnosis: Other abnormalities of gait and mobility (R26.89)     Time: 4166-0630 PT Time Calculation (min) (ACUTE ONLY): 17 min  Charges:  $Gait Training: 8-22 mins                    G Codes:      Reinaldo Berber, PT, DPT Acute Rehab Services Pager: [place] 07/12/2018, 3:25 PM

## 2018-07-12 NOTE — Progress Notes (Signed)
Physical Therapy Treatment Patient Details Name: [name] MRN: [id number] DOB: Sep 23, 1934 Today's Date: 07/12/2018    History of Present Illness Pt is an 84 y.o. female admitted 07/09/18 with c/o SOB and chest pain. Worked up for asthma exacerbation/bronchitis. Also with c/o acute back pain; xray negative. PMH includes HTN, asthma, obesity.    PT Comments    Patient progressing with therapy this afternoon. Session focused extensively on stair training in preperation for likely d/c home tomorrow. Pt progressed to min guard on stairs, will benefit from another session to reinforce new sequencing and safety. HRmax 138, SpO2 WNL on RA with activity stairs and 100' of gait.     Follow Up Recommendations  Home health PT;Supervision - Intermittent     Equipment Recommendations  None recommended by PT    Recommendations for Other Services       Precautions / Restrictions Precautions Precautions: Fall Restrictions Weight Bearing Restrictions: No    Mobility  Bed Mobility               General bed mobility comments: OOB upon arrival   Transfers Overall transfer level: Needs assistance Equipment used: 4-wheeled walker Transfers: Sit to/from Stand Sit to Stand: Supervision            Ambulation/Gait Ambulation/Gait assistance: Supervision Gait Distance (Feet): 100 Feet Assistive device: 4-wheeled walker Gait Pattern/deviations: Step-through pattern;Decreased stride length;Wide base of support Gait velocity: decreased   General Gait Details: SpO2 WNL on RA, utilizing Rollator. HR max 135, declines stairs today. will re-attempt later today. no rest breaks needed, DOE 2/4   Stairs Stairs: Yes Stairs assistance: Min guard Stair Management: Two rails;Step to pattern;Alternating pattern;Forwards Number of Stairs: 10 General stair comments: pt inititally with alternating steps with unsteadiness, cues for step to step and sequencing with stronger leg, progressed  to min guard. will reinforce stairs one more session, SpO2 WNL, DOE 3/4, HR max 138   Wheelchair Mobility    Modified Rankin (Stroke Patients Only)       Balance Overall balance assessment: Needs assistance   Sitting balance-Leahy Scale: Good       Standing balance-Leahy Scale: Fair Standing balance comment: Can static stand with no UE support; dynamic stability improved with UE support                            Cognition Arousal/Alertness: Awake/alert Behavior During Therapy: WFL for tasks assessed/performed Overall Cognitive Status: Within Functional Limits for tasks assessed                                        Exercises      General Comments        Pertinent Vitals/Pain Pain Assessment: Faces Pain Score: 0-No pain    Home Living                      Prior Function            PT Goals (current goals can now be found in the care plan section) Acute Rehab PT Goals Patient Stated Goal: Return home, breathe better PT Goal Formulation: With patient Time For Goal Achievement: 07/25/18 Potential to Achieve Goals: Good Progress towards PT goals: Progressing toward goals    Frequency    Min 3X/week      PT Plan Current plan remains appropriate  Co-evaluation              AM-PAC PT "6 Clicks" Daily Activity  Outcome Measure  Difficulty turning over in bed (including adjusting bedclothes, sheets and blankets)?: None Difficulty moving from lying on back to sitting on the side of the bed? : None Difficulty sitting down on and standing up from a chair with arms (e.g., wheelchair, bedside commode, etc,.)?: None Help needed moving to and from a bed to chair (including a wheelchair)?: A Little Help needed walking in hospital room?: A Little Help needed climbing 3-5 steps with a railing? : A Little 6 Click Score: 21    End of Session Equipment Utilized During Treatment: Gait belt Activity Tolerance: Patient  tolerated treatment well Patient left: with call bell/phone within reach Nurse Communication: Mobility status PT Visit Diagnosis: Other abnormalities of gait and mobility (R26.89)     Time: 1650-1710 PT Time Calculation (min) (ACUTE ONLY): 20 min  Charges:  $Gait Training: 8-22 mins                    G Codes:       Reinaldo Berber, PT, DPT Acute Rehab Services Pager: [place] 07/12/2018, 5:16 PM

## 2018-07-13 DIAGNOSIS — R05 Cough: Secondary | ICD-10-CM

## 2018-07-13 MED ORDER — GUAIFENESIN ER 600 MG PO TB12: 600.0000 mg | ORAL_TABLET | Freq: Two times a day (BID) | ORAL | 0 refills | Status: DC

## 2018-07-13 MED ORDER — BUDESONIDE 0.25 MG/2ML IN SUSP: 0.2500 mg | Freq: Two times a day (BID) | RESPIRATORY_TRACT | 0 refills | Status: DC

## 2018-07-13 MED ORDER — PREDNISONE 20 MG PO TABS: 40.0000 mg | ORAL_TABLET | Freq: Every day | ORAL | 0 refills | Status: DC

## 2018-07-13 MED ORDER — LORATADINE 10 MG PO TABS: 10.0000 mg | ORAL_TABLET | Freq: Every day | ORAL | 0 refills | Status: DC

## 2018-07-13 MED ORDER — PANTOPRAZOLE SODIUM 40 MG PO TBEC: 40.0000 mg | DELAYED_RELEASE_TABLET | Freq: Every day | ORAL | 0 refills | Status: DC

## 2018-07-13 MED ORDER — IPRATROPIUM-ALBUTEROL 0.5-2.5 (3) MG/3ML IN SOLN: 3.0000 mL | Freq: Four times a day (QID) | RESPIRATORY_TRACT | 0 refills | Status: DC

## 2018-07-13 MED ORDER — FLUTICASONE PROPIONATE 50 MCG/ACT NA SUSP: 2.0000 | Freq: Every day | NASAL | 2 refills | Status: DC

## 2018-07-13 MED ORDER — GUAIFENESIN-DM 100-10 MG/5ML PO SYRP: 15.0000 mL | ORAL_SOLUTION | ORAL | 0 refills | Status: DC | PRN

## 2018-07-13 MED ORDER — DOXYCYCLINE HYCLATE 100 MG PO TABS: 100.0000 mg | ORAL_TABLET | Freq: Two times a day (BID) | ORAL | 0 refills | Status: DC

## 2018-07-13 MED ORDER — LOSARTAN POTASSIUM 25 MG PO TABS: 25.0000 mg | ORAL_TABLET | Freq: Every day | ORAL | 0 refills | Status: DC

## 2018-07-13 MED ORDER — ACETAMINOPHEN 325 MG PO TABS: 650.0000 mg | ORAL_TABLET | ORAL | Status: DC | PRN

## 2018-07-13 NOTE — Progress Notes (Signed)
IV removed; educated on d/c instructions; belongings collected; needs last neb tx; will wheel out once ride here & ready.   [place]  Charlann Wayne, RN

## 2018-07-13 NOTE — Progress Notes (Signed)
Pt wheeled out to vehicle.   [place]  Jeovani Weisenburger, RN

## 2018-07-13 NOTE — Progress Notes (Signed)
Physical Therapy Treatment Patient Details Name: [name] MRN: [id number] DOB: 14-Jul-1934 Today's Date: 07/13/2018    History of Present Illness Pt is an 84 y.o. female admitted 07/09/18 with c/o SOB and chest pain. Worked up for asthma exacerbation/bronchitis. Also with c/o acute back pain; xray negative. PMH includes HTN, asthma, obesity.    PT Comments    Session focused on reinforcing stair training. Cues needed for sequencing initially, pt able to demonstrate safety on flight of stairs. Educated pt on daughters safe guarding posture and body mechanics to ensure safety with stand by assistance. Pt comfortable and eager to return home once mediically stable, no concerns from PT standpoint.   HR Max 142 with activity, resting 95 SpO2 WNL on RA  Follow Up Recommendations  Home health PT;Supervision - Intermittent     Equipment Recommendations  None recommended by PT    Recommendations for Other Services       Precautions / Restrictions Precautions Precautions: Fall Restrictions Weight Bearing Restrictions: No    Mobility  Bed Mobility Overal bed mobility: Needs Assistance Bed Mobility: Supine to Sit              Transfers Overall transfer level: Needs assistance Equipment used: 4-wheeled walker Transfers: Sit to/from Stand Sit to Stand: Supervision         General transfer comment: Cues for correct hand placement  Ambulation/Gait Ambulation/Gait assistance: Supervision Gait Distance (Feet): 150 Feet   Gait Pattern/deviations: Step-through pattern;Decreased stride length;Wide base of support     General Gait Details: SpO2 WNL on RA, utilizing Rollator. HR max 135, declines stairs today. will re-attempt later today. no rest breaks needed, DOE 2/4   Stairs Stairs: Yes Stairs assistance: Supervision Stair Management: Two rails;Step to pattern;Alternating pattern;Forwards Number of Stairs: 10 General stair comments: cues for sequencing again, pt  able to demo by end of session. step to pattern no LOB   Wheelchair Mobility    Modified Rankin (Stroke Patients Only)       Balance Overall balance assessment: Needs assistance   Sitting balance-Leahy Scale: Good       Standing balance-Leahy Scale: Fair Standing balance comment: Can static stand with no UE support; dynamic stability improved with UE support                            Cognition Arousal/Alertness: Awake/alert Behavior During Therapy: WFL for tasks assessed/performed Overall Cognitive Status: Within Functional Limits for tasks assessed                                        Exercises      General Comments        Pertinent Vitals/Pain Pain Assessment: No/denies pain    Home Living                      Prior Function            PT Goals (current goals can now be found in the care plan section) Acute Rehab PT Goals Patient Stated Goal: Return home, breathe better PT Goal Formulation: With patient Time For Goal Achievement: 07/25/18 Potential to Achieve Goals: Good Progress towards PT goals: Progressing toward goals    Frequency    Min 3X/week      PT Plan Current plan remains appropriate    Co-evaluation  AM-PAC PT "6 Clicks" Daily Activity  Outcome Measure  Difficulty turning over in bed (including adjusting bedclothes, sheets and blankets)?: None Difficulty moving from lying on back to sitting on the side of the bed? : None Difficulty sitting down on and standing up from a chair with arms (e.g., wheelchair, bedside commode, etc,.)?: None Help needed moving to and from a bed to chair (including a wheelchair)?: A Little Help needed walking in hospital room?: A Little Help needed climbing 3-5 steps with a railing? : A Little 6 Click Score: 21    End of Session Equipment Utilized During Treatment: Gait belt Activity Tolerance: Patient tolerated treatment well Patient left:  with call bell/phone within reach Nurse Communication: Mobility status PT Visit Diagnosis: Other abnormalities of gait and mobility (R26.89)     Time: 1443-1540 PT Time Calculation (min) (ACUTE ONLY): 30 min  Charges:  $Gait Training: 8-22 mins                    G Codes:       Reinaldo Berber, PT, DPT Acute Rehab Services Pager: [phone]     Reinaldo Berber 07/13/2018, 9:54 AM

## 2018-07-13 NOTE — Discharge Summary (Signed)
Physician Discharge Summary  DONNESHA KARG DTO:[id number] DOB: 1934-07-15 DOA: 07/09/2018  PCP: Fayrene Helper, MD  Admit date: 07/09/2018 Discharge date: 07/13/2018  Admitted From: home Discharge disposition: home   Recommendations for Outpatient Follow-Up:   1. Home health 2. PFTs-- ? Vocal cord dysfunction 3. Treatment of allergies   Discharge Diagnosis:   Principal Problem:   Chest pain Active Problems:   Hyperlipidemia   Back pain   Multiple falls   Bronchitis, asthmatic, mild intermittent, uncomplicated    Discharge Condition: Improved.  Diet recommendation: Low sodium, heart healthy.  Wound care: None.  Code status: Full.   History of Present Illness:   [name] is a 84 y.o. female with medical history significant of HTN, HLD, asthma, and obesity; who presents with complaints of shortness of breath and chest pain. Patient admits that same as the symptoms are not necessarily new, but worsened overnight. She has had left-sided chest pain that appears to come and go, but only lasts a few seconds and then self resolved.  She reports pain will radiate down her left arm with symptoms sometimes pain will radiate down her left arm to her fingers.  She reports having significant shortness of breath with any kind of exertion and has had a productive cough with subjective fever. Coughing worsens left mid to lower back pain which patient describes as a rubbing pain and has been going on for weeks.  She has been taking ibuprofen at night for pain with minimal relief.  Patient also has been receiving physical therapy without any significant improvement.  Denies any recent sick contacts to her knowledge, saddle anesthesia, difficulty urinating, diarrhea, constipation, or leg swelling.  Associated symptoms include difficulty ambulating due to back pain, l question syncopal events, and falls.  Last fall approximately 2 weeks ago.     Hospital Course by  Problem:   Chest pain:Acute.  -cardiac enzymes x3 negative -d-dimer negative -echocardiogram:Vigorous LV systolic function; mild diastolic dysfunction; moderate LVH  -no further cardiac work up per cardiology   asthma exacerbation/Bronchitis -Given 125 mg of Solu-Medrol IV and DuoNeb breathing treatment in the ED -Chest x-ray otherwise clear. -DuoNeb'sQID with albuterol PRN -Prednisone 4 burst -Mucinex -add abx 7/16 with improvement -information given about allergy and flares of breathing issues  Back pain:Acute.  -x-rays of the thoracic and lumbar spine negative -Prednisone for suspected bronchitis may help back as well.  Multiple falls, question syncopal episodes: -PT eval- home health  Essential hypertension: -adjust medications for better control  Hyperlipidemia: -Continue pravastatin -LDL: 122  obesity Body mass index is 42.35 kg/m.      Medical Consultants:   cards   Discharge Exam:   Vitals:   07/13/18 0752 07/13/18 1206  BP:  137/81  Pulse:    Resp:    Temp:  98.7 F (37.1 C)  SpO2: 97%    Vitals:   07/12/18 1924 07/13/18 0719 07/13/18 0752 07/13/18 1206  BP: (!) 151/70 134/69  137/81  Pulse: 79     Resp:      Temp: 98.6 F (37 C) 97.8 F (36.6 C)  98.7 F (37.1 C)  TempSrc: Oral Oral  Oral  SpO2: 97%  97%   Weight:      Height:        General exam: Appears calm and comfortable. - upper airway sounds-- improved with cough   The results of significant diagnostics from this hospitalization (including imaging, microbiology, ancillary and laboratory) are listed  below for reference.     Procedures and Diagnostic Studies:   Dg Chest 2 View  Result Date: 07/09/2018 CLINICAL DATA:  Pt reports left sided chest pain and left arm pain, reports sob and productive cough EXAM: CHEST - 2 VIEW COMPARISON:  09/15/2015 FINDINGS: Shallow lung inflation. Heart size is normal accounting for the AP position. The lungs are  clear. No pulmonary edema. IMPRESSION: Shallow inflation. No evidence for acute cardiopulmonary abnormality. Electronically Signed   By: Nolon Nations [name]   On: 07/09/2018 10:13   Dg Thoracic Spine 2 View  Result Date: 07/09/2018 CLINICAL DATA:  Back pain.  Multiple recent falls. EXAM: THORACIC SPINE 2 VIEWS COMPARISON:  Chest x-ray dated September 15, 2015. FINDINGS: Twelve rib-bearing thoracic vertebral bodies. No acute fracture or subluxation. Vertebral body heights are preserved. Alignment is normal. Intervertebral disc spaces are maintained. IMPRESSION: Negative. Electronically Signed   By: Titus Dubin [name]   On: 07/09/2018 15:28   Dg Lumbar Spine 2-3 Views  Result Date: 07/09/2018 CLINICAL DATA:  Back pain.  Multiple recent falls. EXAM: LUMBAR SPINE - 2-3 VIEW COMPARISON:  Lumbar spine MRI dated September 24, 2010. FINDINGS: Unchanged transitional lumbosacral anatomy with partial sacralization of L5 on the right. No acute fracture or subluxation. Vertebral body heights are preserved. Alignment is normal. Moderate disc height loss at L5-S1 is unchanged. Remaining intervertebral disc spaces are relatively preserved. Mild to moderate facet arthropathy throughout the lumbar spine. The sacroiliac joints are unremarkable. Aortic atherosclerosis. IMPRESSION: 1.  No acute osseous abnormality. Electronically Signed   By: Titus Dubin [name]   On: 07/09/2018 15:26     Labs:   Basic Metabolic Panel: Recent Labs  Lab 07/09/18 0934 07/10/18 0607 07/11/18 0358  NA 140 141 143  K 4.6 3.9 3.7  CL 105 107 107  CO2 25 22 24   GLUCOSE 111* 131* 113*  BUN 10 13 18   CREATININE 0.75 0.71 0.82  CALCIUM 9.2 9.3 9.1   GFR Estimated Creatinine Clearance: 64.8 mL/min (by C-G formula based on SCr of 0.82 mg/dL). Liver Function Tests: No results for input(s): AST, ALT, ALKPHOS, BILITOT, PROT, ALBUMIN in the last 168 hours. No results for input(s): LIPASE, AMYLASE in the last 168 hours. No results  for input(s): AMMONIA in the last 168 hours. Coagulation profile No results for input(s): INR, PROTIME in the last 168 hours.  CBC: Recent Labs  Lab 07/09/18 0934 07/10/18 0607 07/11/18 0358  WBC 9.3 8.7 10.8*  HGB 13.6 12.7 12.7  HCT 45.8 41.3 41.9  MCV 87.2 84.3 84.0  PLT 368 368 357   Cardiac Enzymes: Recent Labs  Lab 07/09/18 1615 07/09/18 2150 07/10/18 0607  TROPONINI <0.03 <0.03 <0.03   BNP: Invalid input(s): POCBNP CBG: No results for input(s): GLUCAP in the last 168 hours. D-Dimer No results for input(s): DDIMER in the last 72 hours. Hgb A1c No results for input(s): HGBA1C in the last 72 hours. Lipid Profile No results for input(s): CHOL, HDL, LDLCALC, TRIG, CHOLHDL, LDLDIRECT in the last 72 hours. Thyroid function studies No results for input(s): TSH, T4TOTAL, T3FREE, THYROIDAB in the last 72 hours.  Invalid input(s): FREET3 Anemia work up No results for input(s): VITAMINB12, FOLATE, FERRITIN, TIBC, IRON, RETICCTPCT in the last 72 hours. Microbiology Recent Results (from the past 240 hour(s))  MRSA PCR Screening     Status: None   Collection Time: 07/09/18 11:56 PM  Result Value Ref Range Status   MRSA by PCR NEGATIVE NEGATIVE Final  Comment:        The GeneXpert MRSA Assay (FDA approved for NASAL specimens only), is one component of a comprehensive MRSA colonization surveillance program. It is not intended to diagnose MRSA infection nor to guide or monitor treatment for MRSA infections. Performed at [facility], [place] [address] [place], [place] [address]      Discharge Instructions:   Discharge Instructions    Diet - low sodium heart healthy   Complete by:  As directed    Increase activity slowly   Complete by:  As directed      Allergies as of 07/13/2018      Reactions   Amlodipine    Headache    Oxycodone Nausea And Vomiting   Penicillins Swelling   Pravastatin Other (See Comments)   Gives pt headaches   Tramadol Other  (See Comments)   Pt "unsure"      Medication List    STOP taking these medications   temazepam 7.5 MG capsule Commonly known as:  RESTORIL     TAKE these medications   acetaminophen 325 MG tablet Commonly known as:  TYLENOL Take 2 tablets (650 mg total) by mouth every 4 (four) hours as needed for headache or mild pain.   ADVIL 200 MG Caps Generic drug:  Ibuprofen Take 2 capsules by mouth daily as needed.   albuterol 108 (90 Base) MCG/ACT inhaler Commonly known as:  PROAIR HFA inhale 2 puffs every 6 hours if needed for wheezing   aspirin EC 81 MG tablet Take 1 tablet (81 mg total) by mouth daily.   budesonide 0.25 MG/2ML nebulizer solution Commonly known as:  PULMICORT Take 2 mLs (0.25 mg total) by nebulization 2 (two) times daily.   CALCIUM 500+D HIGH POTENCY 500-400 MG-UNIT tablet Generic drug:  calcium-vitamin D Take 1 tablet by mouth daily.   doxycycline 100 MG tablet Commonly known as:  VIBRA-TABS Take 1 tablet (100 mg total) by mouth every 12 (twelve) hours.   fluticasone 50 MCG/ACT nasal spray Commonly known as:  FLONASE Place 2 sprays into both nostrils daily. Start taking on:  07/14/2018 What changed:    how much to take  how to take this  when to take this  additional instructions   guaiFENesin 600 MG 12 hr tablet Commonly known as:  MUCINEX Take 1 tablet (600 mg total) by mouth 2 (two) times daily.   guaiFENesin-dextromethorphan 100-10 MG/5ML syrup Commonly known as:  ROBITUSSIN DM Take 15 mLs by mouth every 4 (four) hours as needed for cough.   ipratropium-albuterol 0.5-2.5 (3) MG/3ML Soln Commonly known as:  DUONEB Take 3 mLs by nebulization every 6 (six) hours.   loratadine 10 MG tablet Commonly known as:  CLARITIN Take 1 tablet (10 mg total) by mouth daily. Start taking on:  07/14/2018   losartan 25 MG tablet Commonly known as:  COZAAR Take 1 tablet (25 mg total) by mouth daily. Start taking on:  07/14/2018   pantoprazole 40 MG  tablet Commonly known as:  PROTONIX Take 1 tablet (40 mg total) by mouth daily.   pravastatin 10 MG tablet Commonly known as:  PRAVACHOL Take 1 tablet (10 mg total) by mouth daily.   predniSONE 20 MG tablet Commonly known as:  DELTASONE Take 2 tablets (40 mg total) by mouth daily with breakfast. Start taking on:  07/14/2018            Durable Medical Equipment  (From admission, onward)  Start     Ordered   07/13/18 1219  For home use only DME Nebulizer machine  Once    Question:  Patient needs a nebulizer to treat with the following condition  Answer:  Asthma   07/13/18 1219     Follow-up Information    Erma Heritage, PA-C Follow up on 08/02/2018.   Specialties:  Physician Assistant, Cardiology Why:  Please arrive 15 minutes early for your 2:30pm appointment Contact information: [place] [address] [phone]        Fayrene Helper, MD Follow up.   Specialty:  Family Medicine Why:  for PFTs/adjustment of nebulizer medications to inhalers Contact information: [address], [place] [address] [phone]            Time coordinating discharge: 35 min  Signed:  Geradine Girt  [facility] 07/13/2018, 12:30 PM

## 2018-07-13 NOTE — Discharge Instructions (Signed)

## 2018-07-14 ENCOUNTER — Telehealth: Payer: Self-pay

## 2018-07-14 NOTE — Telephone Encounter (Signed)
This was discontinued at the time she was discharged from the office and she was advised to stop taking it, (restoril) It is best for her NOT to start taking this again, please explain this to her next week, I will not be sending it back in

## 2018-07-14 NOTE — Telephone Encounter (Signed)
Transition Care Management Follow-up Telephone Call   Date discharged?  07/13/18              How have you been since you were released from the hospital? right now not too well. My breathing isn't too well. I'm on an inhaler.    Do you understand why you were in the hospital? Asthma exacerbation/COPD exacerbation   Do you understand the discharge instructions? yes   Where were you discharged to?  home   Items Reviewed:  Medications reviewed: yes  Allergies reviewed: yes  Dietary changes reviewed: yes  Referrals reviewed: yes   Functional Questionnaire:   Activities of Daily Living (ADLs):  yes, and has help if she needs help if she needs it.     Any transportation issues/concerns?: no  Any patient concerns? no  Confirmed importance and date/time of follow-up visits scheduled 07/27/18   Confirmed with patient if condition begins to worsen call PCP or go to the ER.  Patient was given the office number and encouraged to call back with question or concerns.  : yes with verbal understanding

## 2018-07-14 NOTE — Telephone Encounter (Signed)
During patients TOC call, she requested a refill on her sleeping pill. I didn't see one on her list. I told her I would send Dr.Simpson a message with the request. She understands she may not receive and answer until Monday

## 2018-07-20 ENCOUNTER — Other Ambulatory Visit: Payer: Self-pay

## 2018-07-20 MED ORDER — DOXYCYCLINE HYCLATE 100 MG PO TABS: 100.0000 mg | ORAL_TABLET | Freq: Two times a day (BID) | ORAL | 0 refills | Status: DC

## 2018-07-20 MED ORDER — PREDNISONE 20 MG PO TABS: 40.0000 mg | ORAL_TABLET | Freq: Every day | ORAL | 0 refills | Status: DC

## 2018-07-20 NOTE — Telephone Encounter (Signed)
pls refill x 1 , thanks

## 2018-07-20 NOTE — Telephone Encounter (Signed)
Patient has follow up 8/1. Still coughing up mucus and hoarse. She was only given 6 doxycycline and 6 prednisone tabs from the hospital and she is requesting that you refill it once to get her through until her appt since she didn't get a full rx on either

## 2018-07-21 DIAGNOSIS — E785 Hyperlipidemia, unspecified: Secondary | ICD-10-CM | POA: Diagnosis not present

## 2018-07-21 DIAGNOSIS — R531 Weakness: Secondary | ICD-10-CM | POA: Diagnosis not present

## 2018-07-21 DIAGNOSIS — R296 Repeated falls: Secondary | ICD-10-CM | POA: Diagnosis not present

## 2018-07-21 DIAGNOSIS — J4520 Mild intermittent asthma, uncomplicated: Secondary | ICD-10-CM | POA: Diagnosis not present

## 2018-07-21 DIAGNOSIS — R269 Unspecified abnormalities of gait and mobility: Secondary | ICD-10-CM | POA: Diagnosis not present

## 2018-07-21 NOTE — Telephone Encounter (Signed)
Done

## 2018-07-26 DIAGNOSIS — R269 Unspecified abnormalities of gait and mobility: Secondary | ICD-10-CM | POA: Diagnosis not present

## 2018-07-26 DIAGNOSIS — R296 Repeated falls: Secondary | ICD-10-CM | POA: Diagnosis not present

## 2018-07-26 DIAGNOSIS — J4520 Mild intermittent asthma, uncomplicated: Secondary | ICD-10-CM | POA: Diagnosis not present

## 2018-07-26 DIAGNOSIS — E785 Hyperlipidemia, unspecified: Secondary | ICD-10-CM | POA: Diagnosis not present

## 2018-07-26 DIAGNOSIS — R531 Weakness: Secondary | ICD-10-CM | POA: Diagnosis not present

## 2018-07-27 ENCOUNTER — Encounter: Payer: Self-pay | Admitting: Family Medicine

## 2018-07-27 ENCOUNTER — Ambulatory Visit (INDEPENDENT_AMBULATORY_CARE_PROVIDER_SITE_OTHER): Payer: Medicare Other | Admitting: Family Medicine

## 2018-07-27 ENCOUNTER — Other Ambulatory Visit: Payer: Self-pay

## 2018-07-27 VITALS — BP 140/90 | HR 85 | Resp 12 | Ht 64.0 in | Wt 247.1 lb

## 2018-07-27 DIAGNOSIS — J4520 Mild intermittent asthma, uncomplicated: Secondary | ICD-10-CM | POA: Diagnosis not present

## 2018-07-27 DIAGNOSIS — Z09 Encounter for follow-up examination after completed treatment for conditions other than malignant neoplasm: Secondary | ICD-10-CM | POA: Diagnosis not present

## 2018-07-27 DIAGNOSIS — Z1231 Encounter for screening mammogram for malignant neoplasm of breast: Secondary | ICD-10-CM

## 2018-07-27 DIAGNOSIS — R296 Repeated falls: Secondary | ICD-10-CM

## 2018-07-27 MED ORDER — LOSARTAN POTASSIUM 25 MG PO TABS: 25.0000 mg | ORAL_TABLET | Freq: Every day | ORAL | 3 refills | Status: DC

## 2018-07-27 MED ORDER — PANTOPRAZOLE SODIUM 40 MG PO TBEC: 40.0000 mg | DELAYED_RELEASE_TABLET | Freq: Every day | ORAL | 3 refills | Status: DC

## 2018-07-27 MED ORDER — PANTOPRAZOLE SODIUM 40 MG PO TBEC: 40.0000 mg | DELAYED_RELEASE_TABLET | Freq: Every day | ORAL | 6 refills | Status: DC

## 2018-07-27 MED ORDER — LORATADINE 10 MG PO TABS: 10.0000 mg | ORAL_TABLET | Freq: Every day | ORAL | 3 refills | Status: DC

## 2018-07-27 NOTE — Patient Instructions (Addendum)
Patient to sign for record release to new PCP in [place] at checkout today  Please schedule mammogram at [facility]  at checkout today

## 2018-08-01 DIAGNOSIS — J4520 Mild intermittent asthma, uncomplicated: Secondary | ICD-10-CM | POA: Diagnosis not present

## 2018-08-01 DIAGNOSIS — R269 Unspecified abnormalities of gait and mobility: Secondary | ICD-10-CM | POA: Diagnosis not present

## 2018-08-01 DIAGNOSIS — E785 Hyperlipidemia, unspecified: Secondary | ICD-10-CM | POA: Diagnosis not present

## 2018-08-01 DIAGNOSIS — R296 Repeated falls: Secondary | ICD-10-CM | POA: Diagnosis not present

## 2018-08-01 DIAGNOSIS — R531 Weakness: Secondary | ICD-10-CM | POA: Diagnosis not present

## 2018-08-01 NOTE — Progress Notes (Signed)
   [name]     MRN: [id number]      DOB: 18-Sep-1934   HPI [name] is here for follow up of recent hospitalization from 07/14 to 07/13/2018 She presented with chest pain, had negative cardiac workup with no further workup recommended by cardiology. Echocardiogram showed mild diastolic dysfunction D dimer was negative She was treated for exacerbation of asthma / bronchitis, the patient has questions as to her exact diagnosis , whether COPD or asthma, and lung function tests are recommended as well s changing her to hand held inhalers , so I will arrange both With her h/o multiple falls, physical therapy  In the home again to evaluate and work with her  Blood pressure management noted to need some adjustment at the time of discharge, again at the visit today BP above goal, however still holding on lowering BP currently She is encouraged strongly to change food choice to lower weight  ROS Denies current  fever or chills.Still feels weak Denies sinus pressure, nasal congestion, ear pain or sore throat. Denies chest congestion, productive cough does c/o intermittent  wheezing. Denies chest pains, palpitations and leg swelling Denies abdominal pain, nausea, vomiting,diarrhea or constipation.   Denies dysuria, frequency, hesitancy or incontinence. C/o  joint pain, swelling and limitation in mobility. Denies headaches, seizures,c/o lower extremity   tingling. Denies depression, des c/o mild  anxiety and  insomnia. Denies skin break down or rash.   PE  BP 140/90 (BP Location: Left Arm, Patient Position: Sitting, Cuff Size: Large)   Pulse 85   Resp 12   Ht 5\' 4"  (1.626 m)   Wt 247 lb 1.3 oz (112.1 kg)   SpO2 95%   BMI 42.41 kg/m   Patient alert and oriented and in no cardiopulmonary distress.at rest   HEENT: No facial asymmetry, EOMI,   oropharynx pink and moist.  Neck supple no JVD, no mass.  Chest: Clear adequate air entry, no crackles , few bibasilar crackles CVS: S1, S2  no murmurs, no S3.Regular rate.  ABD: Soft non tender. obese  Ext: No edema  Kendra: decreased  ROM spine, shoulders, hips and knees.  Skin: Intact, no ulcerations or rash noted.  Psych: Good eye contact, normal affect. Memory intact not anxious or depressed appearing.  CNS: CN 2-12 intact, power,  normal throughout.no focal deficits noted.   [facility] discharge follow-up Hospital course reviewed with patient and her daughter. They have both finally decided that it is now he time to identify a PCP in [place] where [name] lives . 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

## 2018-08-02 ENCOUNTER — Ambulatory Visit: Payer: Medicare Other | Admitting: Student

## 2018-08-03 ENCOUNTER — Encounter: Payer: Self-pay | Admitting: Family Medicine

## 2018-08-03 DIAGNOSIS — J4520 Mild intermittent asthma, uncomplicated: Secondary | ICD-10-CM | POA: Diagnosis not present

## 2018-08-03 DIAGNOSIS — R296 Repeated falls: Secondary | ICD-10-CM | POA: Diagnosis not present

## 2018-08-03 DIAGNOSIS — Z09 Encounter for follow-up examination after completed treatment for conditions other than malignant neoplasm: Secondary | ICD-10-CM | POA: Insufficient documentation

## 2018-08-03 DIAGNOSIS — R269 Unspecified abnormalities of gait and mobility: Secondary | ICD-10-CM | POA: Diagnosis not present

## 2018-08-03 DIAGNOSIS — E785 Hyperlipidemia, unspecified: Secondary | ICD-10-CM | POA: Diagnosis not present

## 2018-08-03 DIAGNOSIS — R531 Weakness: Secondary | ICD-10-CM | POA: Diagnosis not present

## 2018-08-03 NOTE — Assessment & Plan Note (Signed)
Needs lung function tests per recent d/c note and pulmonary evaluation and follow up for definitive diagnosis and ongoing management , will refer

## 2018-08-03 NOTE — Assessment & Plan Note (Signed)
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

## 2018-08-03 NOTE — Assessment & Plan Note (Signed)
Hospital course reviewed with patient and her daughter. They have both finally decided that it is now he time to identify a PCP in [place] where [name] lives . I fully support this and assure them both that her records are available for transfer as soon as they have signed for release and that I wish the patient well.and it has been a pleasure  I will authorize in home physical therapy and arrnage for lung function testing per recommendation from the d/c note and change her to hand held inhalers

## 2018-08-04 DIAGNOSIS — R079 Chest pain, unspecified: Secondary | ICD-10-CM | POA: Diagnosis not present

## 2018-08-04 DIAGNOSIS — Z6841 Body Mass Index (BMI) 40.0 and over, adult: Secondary | ICD-10-CM | POA: Diagnosis not present

## 2018-08-04 DIAGNOSIS — R269 Unspecified abnormalities of gait and mobility: Secondary | ICD-10-CM | POA: Diagnosis not present

## 2018-08-04 DIAGNOSIS — R296 Repeated falls: Secondary | ICD-10-CM | POA: Diagnosis not present

## 2018-08-04 DIAGNOSIS — J4520 Mild intermittent asthma, uncomplicated: Secondary | ICD-10-CM | POA: Diagnosis not present

## 2018-08-04 DIAGNOSIS — I10 Essential (primary) hypertension: Secondary | ICD-10-CM

## 2018-08-04 DIAGNOSIS — E785 Hyperlipidemia, unspecified: Secondary | ICD-10-CM | POA: Diagnosis not present

## 2018-08-04 DIAGNOSIS — Z9181 History of falling: Secondary | ICD-10-CM

## 2018-08-16 ENCOUNTER — Encounter: Payer: Self-pay | Admitting: Family Medicine

## 2018-08-16 ENCOUNTER — Ambulatory Visit (INDEPENDENT_AMBULATORY_CARE_PROVIDER_SITE_OTHER): Payer: Medicare Other | Admitting: Family Medicine

## 2018-08-16 VITALS — BP 132/80 | HR 93 | Temp 98.4°F | Ht 64.0 in | Wt 245.0 lb

## 2018-08-16 DIAGNOSIS — I10 Essential (primary) hypertension: Secondary | ICD-10-CM

## 2018-08-16 DIAGNOSIS — R296 Repeated falls: Secondary | ICD-10-CM | POA: Diagnosis not present

## 2018-08-16 DIAGNOSIS — E785 Hyperlipidemia, unspecified: Secondary | ICD-10-CM | POA: Diagnosis not present

## 2018-08-16 DIAGNOSIS — Z7689 Persons encountering health services in other specified circumstances: Secondary | ICD-10-CM

## 2018-08-16 NOTE — Progress Notes (Signed)
Patient presents to clinic today to follow-up on chronic issues and establish care.  Pt is accompanied by her daughter.  SUBJECTIVE: PMH: pt is an 84 yo female with pmh sig for HTN, HLD.  Patient was previously seen by Dr. Tula Nakayama.  Last hospitalization 07/09/2018 for COPD exacerbation.  HTN: -Patient currently taking losartan 25 mg daily -In the past she checked her BP at home. -Patient trying to eat better but knows she needs to make more changes. -Patient denies headaches, chest pain, blurred vision.  HLD: -Taking pravastatin 10 mg daily -Per chart review pravastatin listed as an allergy -Last lipid panel 07/10/2018 with LDL 122 -Patient denies myalgias  Fall history: -Patient endorses several falls at home. -Patient uses a cane with a slightly wider base when leaving the house. -Patient states she has a Rollator and walker at home but does not typically use them. -Patient states at times her legs feel like they give out. -Pt denies having drugs or think she can trip on at home. -Pt lives with her granddaughter. -was having PT at home but they recently stopped  Allergies: Norvasc Oxycodone-nausea and vomiting Penicillins-edema Tramadol  Past Medical History:  Diagnosis Date  . Asthma   . Back pain   . Depression   . Family history of arthritis   . Family history of diabetes mellitus   . Family history of ischemic heart disease   . Headache(784.0)   . High blood pressure   . High cholesterol   . Hyperlipidemia   . Hypertension   . Obesity   . Pain from implanted hardware 01/28/2012  . Personal history of unspecified circulatory disease   . Sciatica   . Shortness of breath   . Urinary incontinence     Past Surgical History:  Procedure Laterality Date  . ABDOMINAL HYSTERECTOMY    . BACK SURGERY  2009   Dr. Ronnald Ramp [place] [facility]ur[facility]  . COLONOSCOPY    . HARDWARE REMOVAL  01/28/2012   Procedure: HARDWARE REMOVAL;  Surgeon: Johnny Bridge, MD;   Location: [facility];  Service: Orthopedics;  Laterality: Right;  Right Knee  . right knee surgery after fracture  2006  . SPINE SURGERY      Current Outpatient Medications on File Prior to Visit  Medication Sig Dispense Refill  . albuterol (PROAIR HFA) 108 (90 Base) MCG/ACT inhaler inhale 2 puffs every 6 hours if needed for wheezing 18 g 3  . aspirin EC 81 MG tablet Take 1 tablet (81 mg total) by mouth daily. 100 tablet 2  . budesonide (PULMICORT) 0.25 MG/2ML nebulizer solution Take 2 mLs (0.25 mg total) by nebulization 2 (two) times daily. 120 mL 0  . calcium-vitamin D (CALCIUM 500+D HIGH POTENCY) 500-400 MG-UNIT tablet Take 1 tablet by mouth daily.    . fluticasone (FLONASE) 50 MCG/ACT nasal spray Place 2 sprays into both nostrils daily.  2  . guaiFENesin (MUCINEX) 600 MG 12 hr tablet Take 1 tablet (600 mg total) by mouth 2 (two) times daily. 60 tablet 0  . guaiFENesin-dextromethorphan (ROBITUSSIN DM) 100-10 MG/5ML syrup Take 15 mLs by mouth every 4 (four) hours as needed for cough. 118 mL 0  . ipratropium-albuterol (DUONEB) 0.5-2.5 (3) MG/3ML SOLN Take 3 mLs by nebulization every 6 (six) hours. 360 mL 0  . loratadine (CLARITIN) 10 MG tablet Take 1 tablet (10 mg total) by mouth daily. 30 tablet 3  . losartan (COZAAR) 25 MG tablet Take 1 tablet (25 mg total) by  mouth daily. 90 tablet 3  . naproxen sodium (ALEVE) 220 MG tablet Take 220 mg by mouth daily as needed.    . pantoprazole (PROTONIX) 40 MG tablet Take 1 tablet (40 mg total) by mouth daily. 30 tablet 3  . pravastatin (PRAVACHOL) 10 MG tablet Take 1 tablet (10 mg total) by mouth daily. 30 tablet 5   No current facility-administered medications on file prior to visit.     Allergies  Allergen Reactions  . Amlodipine     Headache   . Oxycodone Nausea And Vomiting  . Penicillins Swelling  . Pravastatin Other (See Comments)    Gives pt headaches  . Tramadol Other (See Comments)    Pt "unsure"    Family History    Problem Relation Age of Onset  . Breast cancer Mother        family history   . Hypertension Mother   . Diabetes Mother   . Hypertension Father   . Stroke Father   . Hypertension Sister   . Hypertension Brother   . Hypertension Brother   . Hypertension Brother   . Diabetes Sister   . Stroke Brother   . Heart defect Unknown        family history   . Colon cancer Neg Hx   . Colon polyps Neg Hx   . Esophageal cancer Neg Hx     Social History   Socioeconomic History  . Marital status: Widowed    Spouse name: Not on file  . Number of children: 1  . Years of education: Not on file  . Highest education level: Not on file  Occupational History  . Occupation: retired   Scientific laboratory technician  . Financial resource strain: Not on file  . Food insecurity:    Worry: Not on file    Inability: Not on file  . Transportation needs:    Medical: Not on file    Non-medical: Not on file  Tobacco Use  . Smoking status: Former Smoker    Packs/day: 0.50    Years: 30.00    Pack years: 15.00    Types: Cigarettes    Last attempt to quit: 12/27/1993    Years since quitting: 24.6  . Smokeless tobacco: Never Used  Substance and Sexual Activity  . Alcohol use: No    Alcohol/week: 0.0 standard drinks  . Drug use: No  . Sexual activity: Not Currently  Lifestyle  . Physical activity:    Days per week: Not on file    Minutes per session: Not on file  . Stress: Not on file  Relationships  . Social connections:    Talks on phone: Not on file    Gets together: Not on file    Attends religious service: Not on file    Active member of club or organization: Not on file    Attends meetings of clubs or organizations: Not on file    Relationship status: Not on file  . Intimate partner violence:    Fear of current or ex partner: Not on file    Emotionally abused: Not on file    Physically abused: Not on file    Forced sexual activity: Not on file  Other Topics Concern  . Not on file  Social History  Narrative  . Not on file    ROS General: Denies fever, chills, night sweats, changes in weight, changes in appetite  +h/o falls HEENT: Denies headaches, ear pain, changes in vision, rhinorrhea, sore throat CV:  Denies CP, palpitations, SOB, orthopnea Pulm: Denies SOB, cough, wheezing GI: Denies abdominal pain, nausea, vomiting, diarrhea, constipation GU: Denies dysuria, hematuria, frequency, vaginal discharge Msk: Denies muscle cramps, joint pains Neuro: Denies weakness, numbness, tingling Skin: Denies rashes, bruising Psych: Denies depression, anxiety, hallucinations  BP 132/80 (BP Location: Left Arm, Patient Position: Sitting, Cuff Size: Large)   Pulse 93   Temp 98.4 F (36.9 C) (Oral)   Ht 5' 4"  (1.626 m)   Wt 245 lb (111.1 kg)   SpO2 92%   BMI 42.05 kg/m   Physical Exam Gen. Pleasant, well developed, well-nourished, in NAD HEENT - [place]/AT, PERRL, no scleral icterus, no nasal drainage, pharynx without erythema or exudate. Lungs: no use of accessory muscles, CTAB, no wheezes, rales or rhonchi Cardiovascular: RRR, No r/g/m, no peripheral edema Abdomen: BS present, soft, nontender, nondistended Neuro:  A&Ox3, CN II-XII intact, ambulates with cane  skin:  Warm, dry, intact, no lesions  Recent Results (from the past 2160 hour(s))  Basic metabolic panel     Status: Abnormal   Collection Time: 07/09/18  9:34 AM  Result Value Ref Range   Sodium 140 135 - 145 mmol/L   Potassium 4.6 3.5 - 5.1 mmol/L   Chloride 105 98 - 111 mmol/L    Comment: Please note change in reference range.   CO2 25 22 - 32 mmol/L   Glucose, Bld 111 (H) 70 - 99 mg/dL    Comment: Please note change in reference range.   BUN 10 8 - 23 mg/dL    Comment: Please note change in reference range.   Creatinine, Ser 0.75 0.44 - 1.00 mg/dL   Calcium 9.2 8.9 - 10.3 mg/dL   GFR calc non Af Amer >60 >60 mL/min   GFR calc Af Amer >60 >60 mL/min    Comment: (NOTE) The eGFR has been calculated using the CKD EPI  equation. This calculation has not been validated in all clinical situations. eGFR's persistently <60 mL/min signify possible Chronic Kidney Disease.    Anion gap 10 5 - 15    Comment: Performed at [facility] [address] [place], [place] [address]  CBC     Status: Abnormal   Collection Time: 07/09/18  9:34 AM  Result Value Ref Range   WBC 9.3 4.0 - 10.5 K/uL   RBC 5.25 (H) 3.87 - 5.11 MIL/uL   Hemoglobin 13.6 12.0 - 15.0 g/dL   HCT 45.8 36.0 - 46.0 %   MCV 87.2 78.0 - 100.0 fL   MCH 25.9 (L) 26.0 - 34.0 pg   MCHC 29.7 (L) 30.0 - 36.0 g/dL   RDW 16.0 (H) 11.5 - 15.5 %   Platelets 368 150 - 400 K/uL    Comment: Performed at [facility], [place] [address], [place], [place] [address]  Brain natriuretic peptide     Status: None   Collection Time: 07/09/18  9:34 AM  Result Value Ref Range   B Natriuretic Peptide 15.9 0.0 - 100.0 pg/mL    Comment: Performed at [facility] [address], [place], [place] [address]  I-stat troponin, ED     Status: None   Collection Time: 07/09/18  9:37 AM  Result Value Ref Range   Troponin i, poc 0.04 0.00 - 0.08 ng/mL   Comment 3            Comment: Due to the release kinetics of cTnI, a negative result within the first hours of the onset of  symptoms does not rule out myocardial infarction with certainty. If myocardial infarction is still suspected, repeat the test at appropriate intervals.   Troponin I-serum (0, 3, 6 hours)     Status: None   Collection Time: 07/09/18  4:15 PM  Result Value Ref Range   Troponin I <0.03 <0.03 ng/mL    Comment: Performed at [facility] [address] [place], [place] [address]  D-dimer, quantitative (not at [facility])     Status: None   Collection Time: 07/09/18  4:15 PM  Result Value Ref Range   D-Dimer, Quant <0.27 0.00 - 0.50 ug/mL-FEU    Comment: (NOTE) At the manufacturer cut-off of 0.50 ug/mL FEU, this assay has been documented to exclude PE with a sensitivity and negative  predictive value of 97 to 99%.  At this time, this assay has not been approved by the FDA to exclude DVT/VTE. Results should be correlated with clinical presentation. Performed at [facility], [place] [address], [place], [place] [address]   Troponin I-serum (0, 3, 6 hours)     Status: None   Collection Time: 07/09/18  9:50 PM  Result Value Ref Range   Troponin I <0.03 <0.03 ng/mL    Comment: Performed at [facility] [address], [place], [place] [address]  MRSA PCR Screening     Status: None   Collection Time: 07/09/18 11:56 PM  Result Value Ref Range   MRSA by PCR NEGATIVE NEGATIVE    Comment:        The GeneXpert MRSA Assay (FDA approved for NASAL specimens only), is one component of a comprehensive MRSA colonization surveillance program. It is not intended to diagnose MRSA infection nor to guide or monitor treatment for MRSA infections. Performed at [facility], [place] [address] [place], [place] [address]   Basic metabolic panel     Status: Abnormal   Collection Time: 07/10/18  6:07 AM  Result Value Ref Range   Sodium 141 135 - 145 mmol/L   Potassium 3.9 3.5 - 5.1 mmol/L   Chloride 107 98 - 111 mmol/L    Comment: Please note change in reference range.   CO2 22 22 - 32 mmol/L   Glucose, Bld 131 (H) 70 - 99 mg/dL    Comment: Please note change in reference range.   BUN 13 8 - 23 mg/dL    Comment: Please note change in reference range.   Creatinine, Ser 0.71 0.44 - 1.00 mg/dL   Calcium 9.3 8.9 - 10.3 mg/dL   GFR calc non Af Amer >60 >60 mL/min   GFR calc Af Amer >60 >60 mL/min    Comment: (NOTE) The eGFR has been calculated using the CKD EPI equation. This calculation has not been validated in all clinical situations. eGFR's persistently <60 mL/min signify possible Chronic Kidney Disease.    Anion gap 12 5 - 15    Comment: Performed at [facility] [address], [place],  [address]  CBC     Status: Abnormal   Collection Time:  07/10/18  6:07 AM  Result Value Ref Range   WBC 8.7 4.0 - 10.5 K/uL   RBC 4.90 3.87 - 5.11 MIL/uL   Hemoglobin 12.7 12.0 - 15.0 g/dL   HCT 41.3 36.0 - 46.0 %   MCV 84.3 78.0 - 100.0 fL   MCH 25.9 (L) 26.0 - 34.0 pg   MCHC 30.8 30.0 - 36.0 g/dL   RDW 15.7 (H)  11.5 - 15.5 %   Platelets 368 150 - 400 K/uL    Comment: Performed at [facility], [place] [address], [place], [place] [address]  Lipid panel     Status: Abnormal   Collection Time: 07/10/18  6:07 AM  Result Value Ref Range   Cholesterol 197 0 - 200 mg/dL   Triglycerides 85 <150 mg/dL   HDL 58 >40 mg/dL   Total CHOL/HDL Ratio 3.4 RATIO   VLDL 17 0 - 40 mg/dL   LDL Cholesterol 122 (H) 0 - 99 mg/dL    Comment:        Total Cholesterol/HDL:CHD Risk Coronary Heart Disease Risk Table                     Men   Women  1/2 Average Risk   3.4   3.3  Average Risk       5.0   4.4  2 X Average Risk   9.6   7.1  3 X Average Risk  23.4   11.0        Use the calculated Patient Ratio above and the CHD Risk Table to determine the patient's CHD Risk.        ATP III CLASSIFICATION (LDL):  <100     mg/dL   Optimal  100-129  mg/dL   Near or Above                    Optimal  130-159  mg/dL   Borderline  160-189  mg/dL   High  >190     mg/dL   Very High Performed at [facility] [address] [place], [place] [address]   Troponin I     Status: None   Collection Time: 07/10/18  6:07 AM  Result Value Ref Range   Troponin I <0.03 <0.03 ng/mL    Comment: Performed at [facility] [address] [place], [place] [address]  ECHOCARDIOGRAM COMPLETE     Status: None   Collection Time: 07/10/18 10:53 AM  Result Value Ref Range   Weight 3,947.12 oz   Height 64 in   BP 127/73 mmHg  CBC     Status: Abnormal   Collection Time: 07/11/18  3:58 AM  Result Value Ref Range   WBC 10.8 (H) 4.0 - 10.5 K/uL   RBC 4.99 3.87 - 5.11 MIL/uL   Hemoglobin 12.7 12.0 - 15.0 g/dL   HCT 41.9 36.0 - 46.0 %   MCV 84.0 78.0 - 100.0 fL    MCH 25.5 (L) 26.0 - 34.0 pg   MCHC 30.3 30.0 - 36.0 g/dL   RDW 15.7 (H) 11.5 - 15.5 %   Platelets 357 150 - 400 K/uL    Comment: Performed at [facility], [place] [address] [place], [place] [address]  Basic metabolic panel     Status: Abnormal   Collection Time: 07/11/18  3:58 AM  Result Value Ref Range   Sodium 143 135 - 145 mmol/L   Potassium 3.7 3.5 - 5.1 mmol/L   Chloride 107 98 - 111 mmol/L    Comment: Please note change in reference range.   CO2 24 22 - 32 mmol/L   Glucose, Bld 113 (H) 70 - 99 mg/dL    Comment: Please note change in reference range.   BUN 18 8 - 23 mg/dL    Comment: Please note change in reference range.   Creatinine, Ser 0.82  0.44 - 1.00 mg/dL   Calcium 9.1 8.9 - 10.3 mg/dL   GFR calc non Af Amer >60 >60 mL/min   GFR calc Af Amer >60 >60 mL/min    Comment: (NOTE) The eGFR has been calculated using the CKD EPI equation. This calculation has not been validated in all clinical situations. eGFR's persistently <60 mL/min signify possible Chronic Kidney Disease.    Anion gap 12 5 - 15    Comment: Performed at [facility] [address], [place], [place] [address]    Assessment/Plan: Essential hypertension -Controlled -Continue losartan 25 mg daily -Lifestyle modifications encouraged including increasing physical activity, decreasing salt, increasing p.o. intake of water.  Hyperlipidemia, unspecified hyperlipidemia type -continue pravastatin 10 mg daily. -discussed lifestyle modifications -given handouts -last lipid panel 07/10/18: HDL 58, LDL 122, total cholesterol 197, triglycerides 85  Multiple falls -discussed using rollator or walker instead of cane. -pt/daughter to call H/H agency to see why PT was stopped. -recommend pt continue PT  Encounter to establish care -We reviewed the PMH, PSH, FH, SH, Meds and Allergies. -We provided refills for any medications we will prescribe as needed. -We addressed current concerns per orders and  patient instructions. -We have asked for records for pertinent exams, studies, vaccines and notes from previous providers. -We have advised patient to follow up per instructions below.  F/u prn in the next few months  Grier Mitts, MD

## 2018-08-16 NOTE — Patient Instructions (Signed)
DASH Eating Plan DASH stands for "Dietary Approaches to Stop Hypertension." The DASH eating plan is a healthy eating plan that has been shown to reduce high blood pressure (hypertension). It may also reduce your risk for type 2 diabetes, heart disease, and stroke. The DASH eating plan may also help with weight loss. What are tips for following this plan? General guidelines  Avoid eating more than 2,300 mg (milligrams) of salt (sodium) a day. If you have hypertension, you may need to reduce your sodium intake to 1,500 mg a day.  Limit alcohol intake to no more than 1 drink a day for nonpregnant women and 2 drinks a day for men. One drink equals 12 oz of beer, 5 oz of wine, or 1 oz of hard liquor.  Work with your health care provider to maintain a healthy body weight or to lose weight. Ask what an ideal weight is for you.  Get at least 30 minutes of exercise that causes your heart to beat faster (aerobic exercise) most days of the week. Activities may include walking, swimming, or biking.  Work with your health care provider or diet and nutrition specialist (dietitian) to adjust your eating plan to your individual calorie needs. Reading food labels  Check food labels for the amount of sodium per serving. Choose foods with less than 5 percent of the Daily Value of sodium. Generally, foods with less than 300 mg of sodium per serving fit into this eating plan.  To find whole grains, look for the word "whole" as the first word in the ingredient list. Shopping  Buy products labeled as "low-sodium" or "no salt added."  Buy fresh foods. Avoid canned foods and premade or frozen meals. Cooking  Avoid adding salt when cooking. Use salt-free seasonings or herbs instead of table salt or sea salt. Check with your health care provider or pharmacist before using salt substitutes.  Do not fry foods. Cook foods using healthy methods such as baking, boiling, grilling, and broiling instead.  Cook with  heart-healthy oils, such as olive, canola, soybean, or sunflower oil. Meal planning   Eat a balanced diet that includes: ? 5 or more servings of fruits and vegetables each day. At each meal, try to fill half of your plate with fruits and vegetables. ? Up to 6-8 servings of whole grains each day. ? Less than 6 oz of lean meat, poultry, or fish each day. A 3-oz serving of meat is about the same size as a deck of cards. One egg equals 1 oz. ? 2 servings of low-fat dairy each day. ? A serving of nuts, seeds, or beans 5 times each week. ? Heart-healthy fats. Healthy fats called Omega-3 fatty acids are found in foods such as flaxseeds and coldwater fish, like sardines, salmon, and mackerel.  Limit how much you eat of the following: ? Canned or prepackaged foods. ? Food that is high in trans fat, such as fried foods. ? Food that is high in saturated fat, such as fatty meat. ? Sweets, desserts, sugary drinks, and other foods with added sugar. ? Full-fat dairy products.  Do not salt foods before eating.  Try to eat at least 2 vegetarian meals each week.  Eat more home-cooked food and less restaurant, buffet, and fast food.  When eating at a restaurant, ask that your food be prepared with less salt or no salt, if possible. What foods are recommended? The items listed may not be a complete list. Talk with your dietitian about what   dietary choices are best for you. Grains Whole-grain or whole-wheat bread. Whole-grain or whole-wheat pasta. Brown rice. Oatmeal. Quinoa. Bulgur. Whole-grain and low-sodium cereals. Pita bread. Low-fat, low-sodium crackers. Whole-wheat flour tortillas. Vegetables Fresh or frozen vegetables (raw, steamed, roasted, or grilled). Low-sodium or reduced-sodium tomato and vegetable juice. Low-sodium or reduced-sodium tomato sauce and tomato paste. Low-sodium or reduced-sodium canned vegetables. Fruits All fresh, dried, or frozen fruit. Canned fruit in natural juice (without  added sugar). Meat and other protein foods Skinless chicken or turkey. Ground chicken or turkey. Pork with fat trimmed off. Fish and seafood. Egg whites. Dried beans, peas, or lentils. Unsalted nuts, nut butters, and seeds. Unsalted canned beans. Lean cuts of beef with fat trimmed off. Low-sodium, lean deli meat. Dairy Low-fat (1%) or fat-free (skim) milk. Fat-free, low-fat, or reduced-fat cheeses. Nonfat, low-sodium ricotta or cottage cheese. Low-fat or nonfat yogurt. Low-fat, low-sodium cheese. Fats and oils Soft margarine without trans fats. Vegetable oil. Low-fat, reduced-fat, or light mayonnaise and salad dressings (reduced-sodium). Canola, safflower, olive, soybean, and sunflower oils. Avocado. Seasoning and other foods Herbs. Spices. Seasoning mixes without salt. Unsalted popcorn and pretzels. Fat-free sweets. What foods are not recommended? The items listed may not be a complete list. Talk with your dietitian about what dietary choices are best for you. Grains Baked goods made with fat, such as croissants, muffins, or some breads. Dry pasta or rice meal packs. Vegetables Creamed or fried vegetables. Vegetables in a cheese sauce. Regular canned vegetables (not low-sodium or reduced-sodium). Regular canned tomato sauce and paste (not low-sodium or reduced-sodium). Regular tomato and vegetable juice (not low-sodium or reduced-sodium). Pickles. Olives. Fruits Canned fruit in a light or heavy syrup. Fried fruit. Fruit in cream or butter sauce. Meat and other protein foods Fatty cuts of meat. Ribs. Fried meat. Bacon. Sausage. Bologna and other processed lunch meats. Salami. Fatback. Hotdogs. Bratwurst. Salted nuts and seeds. Canned beans with added salt. Canned or smoked fish. Whole eggs or egg yolks. Chicken or turkey with skin. Dairy Whole or 2% milk, cream, and half-and-half. Whole or full-fat cream cheese. Whole-fat or sweetened yogurt. Full-fat cheese. Nondairy creamers. Whipped toppings.  Processed cheese and cheese spreads. Fats and oils Butter. Stick margarine. Lard. Shortening. Ghee. Bacon fat. Tropical oils, such as coconut, palm kernel, or palm oil. Seasoning and other foods Salted popcorn and pretzels. Onion salt, garlic salt, seasoned salt, table salt, and sea salt. Worcestershire sauce. Tartar sauce. Barbecue sauce. Teriyaki sauce. Soy sauce, including reduced-sodium. Steak sauce. Canned and packaged gravies. Fish sauce. Oyster sauce. Cocktail sauce. Horseradish that you find on the shelf. Ketchup. Mustard. Meat flavorings and tenderizers. Bouillon cubes. Hot sauce and Tabasco sauce. Premade or packaged marinades. Premade or packaged taco seasonings. Relishes. Regular salad dressings. Where to find more information:  National Heart, Lung, and Blood Institute: www.nhlbi.nih.gov  American Heart Association: www.heart.org Summary  The DASH eating plan is a healthy eating plan that has been shown to reduce high blood pressure (hypertension). It may also reduce your risk for type 2 diabetes, heart disease, and stroke.  With the DASH eating plan, you should limit salt (sodium) intake to 2,300 mg a day. If you have hypertension, you may need to reduce your sodium intake to 1,500 mg a day.  When on the DASH eating plan, aim to eat more fresh fruits and vegetables, whole grains, lean proteins, low-fat dairy, and heart-healthy fats.  Work with your health care provider or diet and nutrition specialist (dietitian) to adjust your eating plan to your individual   calorie needs. This information is not intended to replace advice given to you by your health care provider. Make sure you discuss any questions you have with your health care provider. Document Released: 12/02/2011 Document Revised: 12/06/2016 Document Reviewed: 12/06/2016 Elsevier Interactive Patient Education  2018 [place] can cause injuries and can affect people from all age  groups. There are many simple things that you can do to make your home safe and to help prevent falls. What can I do on the outside of my home?  Regularly repair the edges of walkways and driveways and fix any cracks.  Remove high doorway thresholds.  Trim any shrubbery on the main path into your home.  Use bright outdoor lighting.  Clear walkways of debris and clutter, including tools and rocks.  Regularly check that handrails are securely fastened and in good repair. Both sides of any steps should have handrails.  Install guardrails along the edges of any raised decks or porches.  Have leaves, snow, and ice cleared regularly.  Use sand or salt on walkways during winter months.  In the garage, clean up any spills right away, including grease or oil spills. What can I do in the bathroom?  Use night lights.  Install grab bars by the toilet and in the tub and shower. Do not use towel bars as grab bars.  Use non-skid mats or decals on the floor of the tub or shower.  If you need to sit down while you are in the shower, use a plastic, non-slip stool.  Keep the floor dry. Immediately clean up any water that spills on the floor.  Remove soap buildup in the tub or shower on a regular basis.  Attach bath mats securely with double-sided non-slip rug tape.  Remove throw rugs and other tripping hazards from the floor. What can I do in the bedroom?  Use night lights.  Make sure that a bedside light is easy to reach.  Do not use oversized bedding that drapes onto the floor.  Have a firm chair that has side arms to use for getting dressed.  Remove throw rugs and other tripping hazards from the floor. What can I do in the kitchen?  Clean up any spills right away.  Avoid walking on wet floors.  Place frequently used items in easy-to-reach places.  If you need to reach for something above you, use a sturdy step stool that has a grab bar.  Keep electrical cables out of the  way.  Do not use floor polish or wax that makes floors slippery. If you have to use wax, make sure that it is non-skid floor wax.  Remove throw rugs and other tripping hazards from the floor. What can I do in the stairways?  Do not leave any items on the stairs.  Make sure that there are handrails on both sides of the stairs. Fix handrails that are broken or loose. Make sure that handrails are as long as the stairways.  Check any carpeting to make sure that it is firmly attached to the stairs. Fix any carpet that is loose or worn.  Avoid having throw rugs at the top or bottom of stairways, or secure the rugs with carpet tape to prevent them from moving.  Make sure that you have a light switch at the top of the stairs and the bottom of the stairs. If you do not have them, have them installed. What are some other fall prevention tips?  Wear closed-toe shoes that fit well and support your feet. Wear shoes that have rubber soles or low heels.  When you use a stepladder, make sure that it is completely opened and that the sides are firmly locked. Have someone hold the ladder while you are using it. Do not climb a closed stepladder.  Add color or contrast paint or tape to grab bars and handrails in your home. Place contrasting color strips on the first and last steps.  Use mobility aids as needed, such as canes, walkers, scooters, and crutches.  Turn on lights if it is dark. Replace any light bulbs that burn out.  Set up furniture so that there are clear paths. Keep the furniture in the same spot.  Fix any uneven floor surfaces.  Choose a carpet design that does not hide the edge of steps of a stairway.  Be aware of any and all pets.  Review your medicines with your healthcare provider. Some medicines can cause dizziness or changes in blood pressure, which increase your risk of falling. Talk with your health care provider about other ways that you can decrease your risk of falls. This  may include working with a physical therapist or trainer to improve your strength, balance, and endurance. This information is not intended to replace advice given to you by your health care provider. Make sure you discuss any questions you have with your health care provider. Document Released: 12/03/2002 Document Revised: 05/11/2016 Document Reviewed: 01/17/2015 Elsevier Interactive Patient Education  2018 [employer].  How to Take Your Blood Pressure You can take your blood pressure at home with a machine. You may need to check your blood pressure at home:  To check if you have high blood pressure (hypertension).  To check your blood pressure over time.  To make sure your blood pressure medicine is working.  Supplies needed: You will need a blood pressure machine, or monitor. You can buy one at a drugstore or online. When choosing one:  Choose one with an arm cuff.  Choose one that wraps around your upper arm. Only one finger should fit between your arm and the cuff.  Do not choose one that measures your blood pressure from your wrist or finger.  Your doctor can suggest a monitor. How to prepare Avoid these things for 30 minutes before checking your blood pressure:  Drinking caffeine.  Drinking alcohol.  Eating.  Smoking.  Exercising.  Five minutes before checking your blood pressure:  Pee.  Sit in a dining chair. Avoid sitting in a soft couch or armchair.  Be quiet. Do not talk.  How to take your blood pressure Follow the instructions that came with your machine. If you have a digital blood pressure monitor, these may be the instructions: 1. Sit up straight. 2. Place your feet on the floor. Do not cross your ankles or legs. 3. Rest your left arm at the level of your heart. You may rest it on a table, desk, or chair. 4. Pull up your shirt sleeve. 5. Wrap the blood pressure cuff around the upper part of your left arm. The cuff should be 1 inch (2.5 cm) above  your elbow. It is best to wrap the cuff around bare skin. 6. Fit the cuff snugly around your arm. You should be able to place only one finger between the cuff and your arm. 7. Put the cord inside the groove of your elbow. 8. Press the power button. 9. Sit quietly while the cuff fills  with air and loses air. 10. Write down the numbers on the screen. 11. Wait 2-3 minutes and then repeat steps 1-10.  What do the numbers mean? Two numbers make up your blood pressure. The first number is called systolic pressure. The second is called diastolic pressure. An example of a blood pressure reading is "120 over 80" (or 120/80). If you are an adult and do not have a medical condition, use this guide to find out if your blood pressure is normal: Normal  First number: below 120.  Second number: below 80. Elevated  First number: 120-129.  Second number: below 80. Hypertension stage 1  First number: 130-139.  Second number: 80-89. Hypertension stage 2  First number: 140 or above.  Second number: 64 or above. Your blood pressure is above normal even if only the top or bottom number is above normal. Follow these instructions at home:  Check your blood pressure as often as your doctor tells you to.  Take your monitor to your next doctor's appointment. Your doctor will: ? Make sure you are using it correctly. ? Make sure it is working right.  Make sure you understand what your blood pressure numbers should be.  Tell your doctor if your medicines are causing side effects. Contact a doctor if:  Your blood pressure keeps being high. Get help right away if:  Your first blood pressure number is higher than 180.  Your second blood pressure number is higher than 120. This information is not intended to replace advice given to you by your health care provider. Make sure you discuss any questions you have with your health care provider. Document Released: 11/25/2008 Document Revised: 11/10/2016  Document Reviewed: 05/21/2016 Elsevier Interactive Patient Education  2018 [employer].  Preventing Unhealthy [employer], Adult Staying at a healthy weight is important. When fat builds up in your body, you may become overweight or obese. These conditions put you at greater risk for developing certain health problems, such as heart disease, diabetes, sleeping problems, joint problems, and some cancers. Unhealthy weight gain is often the result of making unhealthy choices in what you eat. It is also a result of not getting enough exercise. You can make changes to your lifestyle to prevent obesity and stay as healthy as possible. What nutrition changes can be made? To maintain a healthy weight and prevent obesity:  Eat only as much as your body needs. To do this: ? Pay attention to signs that you are hungry or full. Stop eating as soon as you feel full. ? If you feel hungry, try drinking water first. Drink enough water so your urine is clear or pale yellow. ? Eat smaller portions. ? Look at serving sizes on food labels. Most foods contain more than one serving per container. ? Eat the recommended amount of calories for your gender and activity level. While most active people should eat around 2,000 calories per day, if you are trying to lose weight or are not very active, you main need to eat less calories. Talk to your health care provider or dietitian about how many calories you should eat each day.  Choose healthy foods, such as: ? Fruits and vegetables. Try to fill at least half of your plate at each meal with fruits and vegetables. ? Whole grains, such as whole wheat bread, brown rice, and quinoa. ? Lean meats, such as chicken or fish. ? Other healthy proteins, such as beans, eggs, or tofu. ? Healthy fats, such as nuts, seeds,  fatty fish, and olive oil. ? Low-fat or fat-free dairy.  Check food labels and avoid food and drinks that: ? Are high in calories. ? Have added sugar. ? Are  high in sodium. ? Have saturated fats or trans fats.  Limit how much you eat of the following foods: ? Prepackaged meals. ? Fast food. ? Fried foods. ? Processed meat, such as bacon, sausage, and deli meats. ? Fatty cuts of red meat and poultry with skin.  Cook foods in healthier ways, such as by baking, broiling, or grilling.  When grocery shopping, try to shop around the outside of the store. This helps you buy mostly fresh foods and avoid canned and prepackaged foods.  What lifestyle changes can be made?  Exercise at least 30 minutes 5 or more days each week. Exercising includes brisk walking, yard work, biking, running, swimming, and team sports like basketball and soccer. Ask your health care provider which exercises are safe for you.  Do not use any products that contain nicotine or tobacco, such as cigarettes and e-cigarettes. If you need help quitting, ask your health care provider.  Limit alcohol intake to no more than 1 drink a day for nonpregnant women and 2 drinks a day for men. One drink equals 12 oz of beer, 5 oz of wine, or 1 oz of hard liquor.  Try to get 7-9 hours of sleep each night. What other changes can be made?  Keep a food and activity journal to keep track of: ? What you ate and how many calories you had. Remember to count sauces, dressings, and side dishes. ? Whether you were active, and what exercises you did. ? Your calorie, weight, and activity goals.  Check your weight regularly. Track any changes. If you notice you have gained weight, make changes to your diet or activity routine.  Avoid taking weight-loss medicines or supplements. Talk to your health care provider before starting any new medicine or supplement.  Talk to your health care provider before trying any new diet or exercise plan. Why are these changes important? Eating healthy, staying active, and having healthy habits not only help prevent obesity, they also:  Help you to manage stress  and emotions.  Help you to connect with friends and family.  Improve your self-esteem.  Improve your sleep.  Prevent long-term health problems.  What can happen if changes are not made? Being obese or overweight can cause you to develop joint or bone problems, which can make it hard for you to stay active or do activities you enjoy. Being obese or overweight also puts stress on your heart and lungs and can lead to health problems like diabetes, heart disease, and some cancers. Where to find more information: Talk with your health care provider or a dietitian about healthy eating and healthy lifestyle choices. You may also find other information through these resources:  [place] Department of Agriculture MyPlate: FormerBoss.no  American Heart Association: www.heart.org  Centers for Disease Control and Prevention: http://www.wolf.info/  Summary  Staying at a healthy weight is important. It helps prevent certain diseases and health problems, such as heart disease, diabetes, joint problems, sleep disorders, and some cancers.  Being obese or overweight can cause you to develop joint or bone problems, which can make it hard for you to stay active or do activities you enjoy.  You can prevent unhealthy weight gain by eating a healthy diet, exercising regularly, not smoking, limiting alcohol, and getting enough sleep.  Talk with your health  care provider or a dietitian for guidance about healthy eating and healthy lifestyle choices. This information is not intended to replace advice given to you by your health care provider. Make sure you discuss any questions you have with your health care provider. Document Released: 12/14/2016 Document Revised: 01/19/2017 Document Reviewed: 01/19/2017 Elsevier Interactive Patient Education  [employer].

## 2018-08-17 ENCOUNTER — Encounter: Payer: Self-pay | Admitting: Family Medicine

## 2018-08-17 DIAGNOSIS — I10 Essential (primary) hypertension: Secondary | ICD-10-CM | POA: Insufficient documentation

## 2018-09-07 ENCOUNTER — Encounter: Payer: Self-pay | Admitting: Pulmonary Disease

## 2018-09-07 ENCOUNTER — Other Ambulatory Visit (INDEPENDENT_AMBULATORY_CARE_PROVIDER_SITE_OTHER): Payer: Medicare Other

## 2018-09-07 ENCOUNTER — Ambulatory Visit (INDEPENDENT_AMBULATORY_CARE_PROVIDER_SITE_OTHER): Payer: Medicare Other | Admitting: Pulmonary Disease

## 2018-09-07 DIAGNOSIS — J4520 Mild intermittent asthma, uncomplicated: Secondary | ICD-10-CM

## 2018-09-07 LAB — CBC WITH DIFFERENTIAL/PLATELET
BASOS ABS: 0.1 10*3/uL (ref 0.0–0.1)
Basophils Relative: 1.4 % (ref 0.0–3.0)
EOS ABS: 0.2 10*3/uL (ref 0.0–0.7)
Eosinophils Relative: 1.8 % (ref 0.0–5.0)
HCT: 43.7 % (ref 36.0–46.0)
Hemoglobin: 14 g/dL (ref 12.0–15.0)
LYMPHS ABS: 3.2 10*3/uL (ref 0.7–4.0)
Lymphocytes Relative: 31.9 % (ref 12.0–46.0)
MCHC: 32 g/dL (ref 30.0–36.0)
MCV: 82.3 fl (ref 78.0–100.0)
MONO ABS: 1.4 10*3/uL — AB (ref 0.1–1.0)
Monocytes Relative: 13.8 % — ABNORMAL HIGH (ref 3.0–12.0)
NEUTROS PCT: 51.1 % (ref 43.0–77.0)
Neutro Abs: 5.1 10*3/uL (ref 1.4–7.7)
Platelets: 360 10*3/uL (ref 150.0–400.0)
RBC: 5.3 Mil/uL — AB (ref 3.87–5.11)
RDW: 15.8 % — ABNORMAL HIGH (ref 11.5–15.5)
WBC: 10.1 10*3/uL (ref 4.0–10.5)

## 2018-09-07 MED ORDER — ALBUTEROL SULFATE HFA 108 (90 BASE) MCG/ACT IN AERS: INHALATION_SPRAY | RESPIRATORY_TRACT | 3 refills | Status: DC

## 2018-09-07 MED ORDER — BUDESONIDE-FORMOTEROL FUMARATE 160-4.5 MCG/ACT IN AERO: 2.0000 | INHALATION_SPRAY | Freq: Two times a day (BID) | RESPIRATORY_TRACT | 0 refills | Status: DC

## 2018-09-07 MED ORDER — BUDESONIDE-FORMOTEROL FUMARATE 160-4.5 MCG/ACT IN AERO: 2.0000 | INHALATION_SPRAY | Freq: Two times a day (BID) | RESPIRATORY_TRACT | 3 refills | Status: DC

## 2018-09-07 NOTE — Patient Instructions (Signed)
We will schedule you for pulmonary function tests, check CBC differential, IgE level Will start on Symbicort 160 twice daily and albuterol rescue inhaler Follow-up in 1 to 2 months.

## 2018-09-07 NOTE — Progress Notes (Signed)
[name]    [id number]    July 02, 1934  Primary Care Physician:Banks, Langley Adie, MD  Referring Physician: Fayrene Helper, MD [address], [place], [place] [address]  Chief complaint: Consult for asthma, dyspnea  HPI: 84 year old with past medical history of asthma, obesity Hospitalized from 7/4-7/18/2019 with dyspnea, chest pain she had negative cardiac enzymes, d-dimer.  Treated for asthma exacerbation with Solu-Medrol.  Referred to pulmonary for evaluation Complains of dyspnea with exertion, nonproductive cough, wheezing, occasional chest tightness.  She notices symptoms with wheezing Denies seasonal allergies.  She has heartburn symptoms.  Pets: No pets, Occupation: Exposures: Smoking history: Travel history: Relevant family history:  Interim History:  Physical Exam: Blood pressure 128/78, pulse 91, height 5\' 3"  (1.6 m), weight 239 lb (108.4 kg), SpO2 95 %. Gen:      No acute distress HEENT:  EOMI, sclera anicteric Neck:     No masses; no thyromegaly Lungs:    Clear to auscultation bilaterally; normal respiratory effort CV:         Regular rate and rhythm; no murmurs Abd:      + bowel sounds; soft, non-tender; no palpable masses, no distension Ext:    No edema; adequate peripheral perfusion Skin:      Warm and dry; no rash Neuro: alert and oriented x 3 Psych: normal mood and affect  Data Reviewed: Imaging: CT chest 07/25/2012- no acute cardiopulmonary disease, mild cylindrical bronchiectasis in the medial aspect of right lower lobe. Chest x-ray 07/11/2018-clear lungs, no acute abnormality.  I have reviewed the images personally  PFTs:  Labs: CBC 09/07/2018-WBC 10.1, eos 1.8%, absolute eosinophil count 181  Assessment:  Consult for asthma She had a recent exacerbation and hospitalization. Currently only on Pulmicort nebulizer We will stop this and start Symbicort and albuterol rescue inhaler Check pulmonary function test, CBC  differential, IgE levels  Previous CT from 2013 noted for very mild bronchiectasis in the right lower lobe which may be sequelae of prior infection.  We will continue to monitor this.  Plan/Recommendations: - PFTs, CBC, IgE - Start Symbicort, albuterol rescue inhaler.  Outpatient Encounter Medications as of 09/07/2018  Medication Sig  . albuterol (PROAIR HFA) 108 (90 Base) MCG/ACT inhaler inhale 2 puffs every 6 hours if needed for wheezing  . aspirin EC 81 MG tablet Take 1 tablet (81 mg total) by mouth daily.  . budesonide (PULMICORT) 0.25 MG/2ML nebulizer solution Take 2 mLs (0.25 mg total) by nebulization 2 (two) times daily.  . calcium-vitamin D (CALCIUM 500+D HIGH POTENCY) 500-400 MG-UNIT tablet Take 1 tablet by mouth daily.  . fluticasone (FLONASE) 50 MCG/ACT nasal spray Place 2 sprays into both nostrils daily.  Marland Kitchen guaiFENesin (MUCINEX) 600 MG 12 hr tablet Take 1 tablet (600 mg total) by mouth 2 (two) times daily.  Marland Kitchen guaiFENesin-dextromethorphan (ROBITUSSIN DM) 100-10 MG/5ML syrup Take 15 mLs by mouth every 4 (four) hours as needed for cough.  Marland Kitchen ipratropium-albuterol (DUONEB) 0.5-2.5 (3) MG/3ML SOLN Take 3 mLs by nebulization every 6 (six) hours.  Marland Kitchen loratadine (CLARITIN) 10 MG tablet Take 1 tablet (10 mg total) by mouth daily.  Marland Kitchen losartan (COZAAR) 25 MG tablet Take 1 tablet (25 mg total) by mouth daily.  . naproxen sodium (ALEVE) 220 MG tablet Take 220 mg by mouth daily as needed.  . pantoprazole (PROTONIX) 40 MG tablet Take 1 tablet (40 mg total) by mouth daily.  . pravastatin (PRAVACHOL) 10 MG tablet Take 1  tablet (10 mg total) by mouth daily.   No facility-administered encounter medications on file as of 09/07/2018.     Allergies as of 09/07/2018 - Review Complete 09/07/2018  Allergen Reaction Noted  . Amlodipine  06/08/2017  . Oxycodone Nausea And Vomiting 03/23/2011  . Penicillins Swelling   . Pravastatin Other (See Comments) 09/30/2014  . Tramadol Other (See Comments)  04/28/2010    Past Medical History:  Diagnosis Date  . Asthma   . Back pain   . Depression   . Family history of arthritis   . Family history of diabetes mellitus   . Family history of ischemic heart disease   . Headache(784.0)   . High blood pressure   . High cholesterol   . Hyperlipidemia   . Hypertension   . Obesity   . Pain from implanted hardware 01/28/2012  . Personal history of unspecified circulatory disease   . Sciatica   . Shortness of breath   . Urinary incontinence     Past Surgical History:  Procedure Laterality Date  . ABDOMINAL HYSTERECTOMY    . BACK SURGERY  2009   Dr. Ronnald Ramp [place] [facility]  . COLONOSCOPY    . HARDWARE REMOVAL  01/28/2012   Procedure: HARDWARE REMOVAL;  Surgeon: Johnny Bridge, MD;  Location: [facility];  Service: Orthopedics;  Laterality: Right;  Right Knee  . right knee surgery after fracture  2006  . SPINE SURGERY      Family History  Problem Relation Age of Onset  . Breast cancer Mother        family history   . Hypertension Mother   . Diabetes Mother   . Hypertension Father   . Stroke Father   . Hypertension Sister   . Hypertension Brother   . Hypertension Brother   . Hypertension Brother   . Diabetes Sister   . Stroke Brother   . Heart defect Unknown        family history   . Colon cancer Neg Hx   . Colon polyps Neg Hx   . Esophageal cancer Neg Hx     Social History   Socioeconomic History  . Marital status: Widowed    Spouse name: Not on file  . Number of children: 1  . Years of education: Not on file  . Highest education level: Not on file  Occupational History  . Occupation: retired   Scientific laboratory technician  . Financial resource strain: Not on file  . Food insecurity:    Worry: Not on file    Inability: Not on file  . Transportation needs:    Medical: Not on file    Non-medical: Not on file  Tobacco Use  . Smoking status: Former Smoker    Packs/day: 0.50    Years: 30.00    Pack years:  15.00    Types: Cigarettes    Last attempt to quit: 12/27/1993    Years since quitting: 24.7  . Smokeless tobacco: Never Used  Substance and Sexual Activity  . Alcohol use: No    Alcohol/week: 0.0 standard drinks  . Drug use: No  . Sexual activity: Not Currently  Lifestyle  . Physical activity:    Days per week: Not on file    Minutes per session: Not on file  . Stress: Not on file  Relationships  . Social connections:    Talks on phone: Not on file    Gets together: Not on file    Attends religious service:  Not on file    Active member of club or organization: Not on file    Attends meetings of clubs or organizations: Not on file    Relationship status: Not on file  . Intimate partner violence:    Fear of current or ex partner: Not on file    Emotionally abused: Not on file    Physically abused: Not on file    Forced sexual activity: Not on file  Other Topics Concern  . Not on file  Social History Narrative  . Not on file   Review of systems: Review of Systems  Constitutional: Negative for fever and chills.  HENT: Negative.   Eyes: Negative for blurred vision.  Respiratory: as per HPI  Cardiovascular: Negative for chest pain and palpitations.  Gastrointestinal: Negative for vomiting, diarrhea, blood per rectum. Genitourinary: Negative for dysuria, urgency, frequency and hematuria.  Musculoskeletal: Negative for myalgias, back pain and joint pain.  Skin: Negative for itching and rash.  Neurological: Negative for dizziness, tremors, focal weakness, seizures and loss of consciousness.  Endo/Heme/Allergies: Negative for environmental allergies.  Psychiatric/Behavioral: Negative for depression, suicidal ideas and hallucinations.  All other systems reviewed and are negative.  Marshell Garfinkel MD [facility] 09/07/2018, 10:33 AM  CC: Fayrene Helper, MD

## 2018-09-14 ENCOUNTER — Ambulatory Visit
Admission: RE | Admit: 2018-09-14 | Discharge: 2018-09-14 | Disposition: A | Discharge status: Home / Self Care | Payer: Medicare Other | Source: Ambulatory Visit | Attending: Family Medicine | Admitting: Family Medicine

## 2018-09-14 DIAGNOSIS — Z1231 Encounter for screening mammogram for malignant neoplasm of breast: Secondary | ICD-10-CM

## 2018-09-27 ENCOUNTER — Telehealth: Payer: Self-pay | Admitting: Pulmonary Disease

## 2018-09-27 NOTE — Telephone Encounter (Signed)
IgE was ordered the same day as CBC; but not collected.  The labs were not able to provide an answer at this time, they are researching and will call back. Meantime, placed another order for IgE today Pt will come by this week or next to complete again.  Routing message to [facility] for review.

## 2018-09-28 ENCOUNTER — Other Ambulatory Visit: Payer: Medicare Other

## 2018-09-28 ENCOUNTER — Other Ambulatory Visit: Payer: Self-pay | Admitting: Pulmonary Disease

## 2018-09-28 DIAGNOSIS — J449 Chronic obstructive pulmonary disease, unspecified: Secondary | ICD-10-CM

## 2018-09-29 ENCOUNTER — Telehealth: Payer: Self-pay | Admitting: Pulmonary Disease

## 2018-09-29 LAB — IGE: IgE (Immunoglobulin E), Serum: 33 kU/L (ref <=114)

## 2018-09-29 MED ORDER — FLUTICASONE-SALMETEROL 500-50 MCG/DOSE IN AEPB: 1.0000 | INHALATION_SPRAY | Freq: Two times a day (BID) | RESPIRATORY_TRACT | 4 refills | Status: DC

## 2018-09-29 NOTE — Telephone Encounter (Signed)
Received alternative list from [facility] for Symbicort.  Per Dr. Vaughan Browner- switch to Advair 500. Spoke to pt, who states that she is only taking albuterol once daily prn.  Per our conversation, it does not appear that pt has been taking symbicort.  Pt is aware that Rx for Advair 500 has been sent to preferred pharmacy. Nothing further is needed.

## 2018-10-03 NOTE — Telephone Encounter (Signed)
Per pt's chart, pt had repeat IgE on 09/28/18. Will close encounter, as nothing further is needed.

## 2018-10-04 ENCOUNTER — Ambulatory Visit (INDEPENDENT_AMBULATORY_CARE_PROVIDER_SITE_OTHER): Payer: Medicare Other | Admitting: Family Medicine

## 2018-10-04 ENCOUNTER — Encounter: Payer: Self-pay | Admitting: Family Medicine

## 2018-10-04 VITALS — BP 118/74 | HR 84 | Temp 98.8°F | Wt 248.0 lb

## 2018-10-04 DIAGNOSIS — R600 Localized edema: Secondary | ICD-10-CM | POA: Diagnosis not present

## 2018-10-04 DIAGNOSIS — J302 Other seasonal allergic rhinitis: Secondary | ICD-10-CM

## 2018-10-04 DIAGNOSIS — R351 Nocturia: Secondary | ICD-10-CM | POA: Diagnosis not present

## 2018-10-04 DIAGNOSIS — Z23 Encounter for immunization: Secondary | ICD-10-CM

## 2018-10-04 DIAGNOSIS — I10 Essential (primary) hypertension: Secondary | ICD-10-CM | POA: Diagnosis not present

## 2018-10-04 DIAGNOSIS — E782 Mixed hyperlipidemia: Secondary | ICD-10-CM | POA: Diagnosis not present

## 2018-10-04 LAB — BASIC METABOLIC PANEL
BUN: 14 mg/dL (ref 6–23)
CHLORIDE: 103 meq/L (ref 96–112)
CO2: 31 mEq/L (ref 19–32)
CREATININE: 0.66 mg/dL (ref 0.40–1.20)
Calcium: 9.9 mg/dL (ref 8.4–10.5)
GFR: 110.17 mL/min (ref >60.00)
Glucose, Bld: 89 mg/dL (ref 70–99)
Potassium: 4.5 mEq/L (ref 3.5–5.1)
Sodium: 141 mEq/L (ref 135–145)

## 2018-10-04 LAB — BRAIN NATRIURETIC PEPTIDE: PRO B NATRI PEPTIDE: 23 pg/mL (ref 0.0–100.0)

## 2018-10-04 LAB — HEMOGLOBIN A1C: HEMOGLOBIN A1C: 6.4 % (ref 4.6–6.5)

## 2018-10-04 MED ORDER — FEXOFENADINE HCL 180 MG PO TABS: 180.0000 mg | ORAL_TABLET | Freq: Every day | ORAL | 3 refills | Status: DC

## 2018-10-04 MED ORDER — PRAVASTATIN SODIUM 10 MG PO TABS: 10.0000 mg | ORAL_TABLET | Freq: Every day | ORAL | 1 refills | Status: DC

## 2018-10-04 MED ORDER — FLUTICASONE PROPIONATE 50 MCG/ACT NA SUSP: 2.0000 | Freq: Every day | NASAL | 5 refills | Status: DC

## 2018-10-04 NOTE — Progress Notes (Signed)
Subjective:    Patient ID: [name], female    DOB: 1934-11-18, 84 y.o.   MRN: [id number]  No chief complaint on file.   HPI Patient was seen today for acute concern.  Pt endorses LE edema x a few days.  Pt also notes a general unwell feeling.  Pt states she does a lot of sitting during the day.  She also notes increased appetite and some SOB.  Pt has been sleeping in a recliner, but notes her new bed is too high for her to get into.  Pt also notes nocturia, may get up every 2 hours or so.  Pt has a h/o allergies, taking claritin and flonase.  Pt not sure Claritin helping her symptoms.  Pt continues to have watery/itchy eyes.  Pt requesting refill Flonase and pravastatin.  Pravastatin is listed as 1 of patient's allergies causing headache.  Pt states she is unsure that it actually caused her to have a headache as she is on some new medications.  Past Medical History:  Diagnosis Date  . Asthma   . Back pain   . Depression   . Family history of arthritis   . Family history of diabetes mellitus   . Family history of ischemic heart disease   . Headache(784.0)   . High blood pressure   . High cholesterol   . Hyperlipidemia   . Hypertension   . Obesity   . Pain from implanted hardware 01/28/2012  . Personal history of unspecified circulatory disease   . Sciatica   . Shortness of breath   . Urinary incontinence     Allergies  Allergen Reactions  . Amlodipine     Headache   . Oxycodone Nausea And Vomiting  . Penicillins Swelling  . Pravastatin Other (See Comments)    Gives pt headaches  . Tramadol Other (See Comments)    Pt "unsure"    ROS General: Denies fever, chills, night sweats, changes in weight, changes in appetite HEENT: Denies headaches, ear pain, changes in vision, rhinorrhea, sore throat  +itchy eyes, watery eyes CV: Denies CP, palpitations, SOB, orthopnea  +LE edema b/l Pulm: Denies SOB, cough, wheezing GI: Denies abdominal pain, nausea, vomiting, diarrhea,  constipation  GU: Denies dysuria, hematuria, frequency, vaginal discharge  +nocturia Msk: Denies muscle cramps  +back pain, knee pain Neuro: Denies weakness, numbness, tingling Skin: Denies rashes, bruising Psych: Denies depression, anxiety, hallucinations     Objective:    Blood pressure 118/74, pulse 84, temperature 98.8 F (37.1 C), temperature source Oral, weight 248 lb (112.5 kg), SpO2 94 %.   Gen. Pleasant, well-nourished, in no distress, normal affect   HEENT: [place]/AT, face symmetric, periorbital edema, no scleral icterus, PERRLA, nares patent without drainage Lungs: no accessory muscle use, CTAB, no wheezes or rales Cardiovascular: RRR, no m/r/g, 1+ pitting edema at ankles b/l Neuro:  A&Ox3, CN II-XII intact, using walker   Wt Readings from Last 3 Encounters:  10/04/18 248 lb (112.5 kg)  09/07/18 239 lb (108.4 kg)  08/16/18 245 lb (111.1 kg)    Lab Results  Component Value Date   WBC 10.1 09/07/2018   HGB 14.0 09/07/2018   HCT 43.7 09/07/2018   PLT 360.0 09/07/2018   GLUCOSE 113 (H) 07/11/2018   CHOL 197 07/10/2018   TRIG 85 07/10/2018   HDL 58 07/10/2018   LDLCALC 122 (H) 07/10/2018   ALT 21 02/01/2018   AST 18 02/01/2018   NA 143 07/11/2018   K 3.7 07/11/2018  CL 107 07/11/2018   CREATININE 0.82 07/11/2018   BUN 18 07/11/2018   CO2 24 07/11/2018   TSH 1.56 06/01/2017   INR 0.9 04/04/2008   HGBA1C 6.2 (H) 06/01/2017    Assessment/Plan:  Bilateral lower extremity edema  -Discussed possible causes including prolonged sitting or standing and increased sodium intake as well as CHF. -Discussed elevating lower extremities when sitting. -Given handout - Plan: Brain Natriuretic Peptide, Basic metabolic panel  Essential hypertension  -Controlled -Continue losartan 25 mg daily. - Plan: Basic metabolic panel  Seasonal allergies -We will discontinue Claritin. - Plan: fexofenadine (ALLEGRA) 180 MG tablet, fluticasone (FLONASE) 50 MCG/ACT nasal  spray  Nocturia  - Plan: Hemoglobin A1c  Mixed hyperlipidemia  -discussed lifestyle modifications. -Discussed pravastatin listed allergy causing headache, however patient wishes to restart medication. - Plan: pravastatin (PRAVACHOL) 10 MG tablet  Influenza vaccine administered -Flu shot given this visit  Follow-up PRN for continued symptoms  Grier Mitts, MD

## 2018-10-04 NOTE — Patient Instructions (Signed)
Peripheral Edema Peripheral edema is swelling that is caused by a buildup of fluid. Peripheral edema most often affects the lower legs, ankles, and feet. It can also develop in the arms, hands, and face. The area of the body that has peripheral edema will look swollen. It may also feel heavy or warm. Your clothes may start to feel tight. Pressing on the area may make a temporary dent in your skin. You may not be able to move your arm or leg as much as usual. There are many causes of peripheral edema. It can be a complication of other diseases, such as congestive heart failure, kidney disease, or a problem with your blood circulation. It also can be a side effect of certain medicines. It often happens to women during pregnancy. Sometimes, the cause is not known. Treating the underlying condition is often the only treatment for peripheral edema. Follow these instructions at home: Pay attention to any changes in your symptoms. Take these actions to help with your discomfort:  Raise (elevate) your legs while you are sitting or lying down.  Move around often to prevent stiffness and to lessen swelling. Do not sit or stand for long periods of time.  Wear support stockings as told by your health care provider.  Follow instructions from your health care provider about limiting salt (sodium) in your diet. Sometimes eating less salt can reduce swelling.  Take over-the-counter and prescription medicines only as told by your health care provider. Your health care provider may prescribe medicine to help your body get rid of excess water (diuretic).  Keep all follow-up visits as told by your health care provider. This is important.  Contact a health care provider if:  You have a fever.  Your edema starts suddenly or is getting worse, especially if you are pregnant or have a medical condition.  You have swelling in only one leg.  You have increased swelling and pain in your legs. Get help right away  if:  You develop shortness of breath, especially when you are lying down.  You have pain in your chest or abdomen.  You feel weak.  You faint. This information is not intended to replace advice given to you by your health care provider. Make sure you discuss any questions you have with your health care provider. Document Released: 01/20/2005 Document Revised: 05/17/2016 Document Reviewed: 06/25/2015 Elsevier Interactive Patient Education  2018 [place], Adult An allergy is when your body's defense system (immune system) overreacts to an otherwise harmless substance (allergen) that you breathe in or eat or something that touches your skin. When you come into contact with something that you are allergic to, your immune system produces certain proteins (antibodies). These proteins cause cells to release chemicals (histamines) that trigger the symptoms of an allergic reaction. Allergies often affect the nasal passages (allergic rhinitis), eyes (allergic conjunctivitis), skin (atopic dermatitis), and stomach. Allergies can be mild or severe. Allergies cannot spread from person to person (are not contagious). They can develop at any age and may be outgrown. What increases the risk? You may be at greater risk of allergies if other people in your family have allergies. What are the signs or symptoms? Symptoms depend on what type of allergy you have. They may include:  Runny, stuffy nose.  Sneezing.  Itchy mouth, ears, or throat.  Postnasal drip.  Sore throat.  Itchy, red, watery, or puffy eyes.  Skin rash or hives.  Stomach pain.  Vomiting.  Diarrhea.  Bloating.  Wheezing or coughing.  People with a severe allergy to food, medicine, or an insect bite may have a life-threatening allergic reaction (anaphylaxis). Symptoms of anaphylaxis include:  Hives.  Itching.  Flushed face.  Swollen lips, tongue, or mouth.  Tight or swollen throat.  Chest pain or  tightness in the chest.  Trouble breathing or shortness of breath.  Rapid heartbeat.  Dizziness or fainting.  Vomiting.  Diarrhea.  Pain in the abdomen.  How is this diagnosed? This condition is diagnosed based on:  Your symptoms.  Your family and medical history.  A physical exam.  You may need to see a health care provider who specializes in treating allergies (allergist). You may also have tests, including:  Skin tests to see which allergens are causing your symptoms, such as: ? Skin prick test. In this test, your skin is pricked with a tiny needle and exposed to small amounts of possible allergens to see if your skin reacts. ? Intradermal skin test. In this test, a small amount of allergen is injected under your skin to see if your skin reacts. ? Patch test. In this test, a small amount of allergen is placed on your skin and then your skin is covered with a bandage. Your health care provider will check your skin after a couple of days to see if a rash has developed.  Blood tests.  Challenges tests. In this test, you inhale a small amount of allergen by mouth to see if you have an allergic reaction.  You may also be asked to:  Keep a food diary. A food diary is a record of all the foods and drinks you have in a day and any symptoms you experience.  Practice an elimination diet. An elimination diet involves eliminating specific foods from your diet and then adding them back in one by one to find out if a certain food causes an allergic reaction.  How is this treated? Treatment for allergies depends on your symptoms. Treatment may include:  Cold compresses to soothe itching and swelling.  Eye drops.  Nasal sprays.  Using a saline spray or container (neti pot) to flush out the nose (nasal irrigation). These methods can help clear away mucus and keep the nasal passages moist.  Using a humidifier.  Oral antihistamines or other medicines to block allergic reaction  and inflammation.  Skin creams to treat rashes or itching.  Diet changes to eliminate food allergy triggers.  Repeated exposure to tiny amounts of allergens to build up a tolerance and prevent future allergic reactions (immunotherapy). These include: ? Allergy shots. ? Oral treatment. This involves taking small doses of an allergen under the tongue (sublingual immunotherapy).  Emergency epinephrine injection (auto-injector) in case of an allergic emergency. This is a self-injectable, pre-measured medicine that must be given within the first few minutes of a serious allergic reaction.  Follow these instructions at home:  Avoid known allergens whenever possible.  If you suffer from airborne allergens, wash out your nose daily. You can do this with a saline spray or a neti pot to flush out your nose (nasal irrigation).  Take over-the-counter and prescription medicines only as told by your health care provider.  Keep all follow-up visits as told by your health care provider. This is important.  If you are at risk of a severe allergic reaction (anaphylaxis), keep your auto-injector with you at all times.  If you have ever had anaphylaxis, wear a medical alert bracelet or necklace that states you have  a severe allergy. Contact a health care provider if:  Your symptoms do not improve with treatment. Get help right away if:  You have symptoms of anaphylaxis, such as: ? Swollen mouth, tongue, or throat. ? Pain or tightness in your chest. ? Trouble breathing or shortness of breath. ? Dizziness or fainting. ? Severe abdominal pain, vomiting, or diarrhea. This information is not intended to replace advice given to you by your health care provider. Make sure you discuss any questions you have with your health care provider. Document Released: 03/08/2003 Document Revised: 04/13/2017 Document Reviewed: 06/30/2016 Elsevier Interactive Patient Education  [employer].

## 2018-10-04 NOTE — Addendum Note (Signed)
Addended by: Wyvonne Lenz on: 10/04/2018 04:30 PM   Modules accepted: Orders

## 2018-10-05 ENCOUNTER — Other Ambulatory Visit: Payer: Medicare Other

## 2018-10-05 NOTE — Telephone Encounter (Signed)
Spoke to [place] in lab for update on igE that was drawn on 09/07/18. Kenney Houseman is unsure as to why lab was not resulted, but ensured me that pt would not be charged for this. igE has been redrawn.  Nothing further is needed.

## 2018-10-25 ENCOUNTER — Ambulatory Visit: Payer: Medicare Other

## 2018-10-25 ENCOUNTER — Ambulatory Visit: Payer: Medicare Other | Admitting: Pulmonary Disease

## 2018-11-10 ENCOUNTER — Ambulatory Visit: Payer: Medicare Other | Admitting: Pulmonary Disease

## 2018-12-13 ENCOUNTER — Ambulatory Visit: Payer: Medicare Other | Admitting: Pulmonary Disease

## 2019-01-18 ENCOUNTER — Encounter: Payer: Self-pay | Admitting: Pulmonary Disease

## 2019-01-18 ENCOUNTER — Ambulatory Visit (INDEPENDENT_AMBULATORY_CARE_PROVIDER_SITE_OTHER): Payer: Medicare Other | Admitting: Pulmonary Disease

## 2019-01-18 VITALS — BP 142/62 | HR 95 | Ht 63.0 in | Wt 249.0 lb

## 2019-01-18 DIAGNOSIS — J4520 Mild intermittent asthma, uncomplicated: Secondary | ICD-10-CM

## 2019-01-18 DIAGNOSIS — E669 Obesity, unspecified: Secondary | ICD-10-CM

## 2019-01-18 LAB — PULMONARY FUNCTION TEST
FEF 25-75 POST: 1.81 L/s
FEF 25-75 PRE: 1.87 L/s
FEF2575-%Change-Post: -2 %
FEF2575-%PRED-PRE: 160 %
FEF2575-%Pred-Post: 155 %
FEV1-%CHANGE-POST: -2 %
FEV1-%PRED-POST: 88 %
FEV1-%PRED-PRE: 90 %
FEV1-POST: 1.25 L
FEV1-Pre: 1.28 L
FEV1FVC-%Change-Post: -4 %
FEV1FVC-%PRED-PRE: 119 %
FEV6-%CHANGE-POST: 3 %
FEV6-%PRED-POST: 83 %
FEV6-%Pred-Pre: 80 %
FEV6-Post: 1.46 L
FEV6-Pre: 1.41 L
FEV6FVC-%CHANGE-POST: 1 %
FEV6FVC-%PRED-POST: 104 %
FEV6FVC-%Pred-Pre: 103 %
FVC-%Change-Post: 2 %
FVC-%Pred-Post: 79 %
FVC-%Pred-Pre: 77 %
FVC-Post: 1.47 L
FVC-Pre: 1.43 L
PRE FEV1/FVC RATIO: 89 %
Post FEV1/FVC ratio: 85 %
Post FEV6/FVC ratio: 100 %
Pre FEV6/FVC Ratio: 98 %

## 2019-01-18 MED ORDER — BUDESONIDE-FORMOTEROL FUMARATE 80-4.5 MCG/ACT IN AERO: 2.0000 | INHALATION_SPRAY | Freq: Two times a day (BID) | RESPIRATORY_TRACT | 12 refills | Status: DC

## 2019-01-18 NOTE — Patient Instructions (Addendum)
I am glad you are doing well with your breathing Continue the Symbicort.  We will reduce the dose to 80/4.5 Current weight loss with diet and exercise We will make a referral to weight loss clinic to help with this  Follow-up in 6 months.

## 2019-01-18 NOTE — Progress Notes (Signed)
Spirometry pre and post done today. 

## 2019-01-18 NOTE — Progress Notes (Signed)
[name]    [id number]    30-Nov-1934  Primary Care Physician:Banks, Langley Adie, MD  Referring Physician: Billie Ruddy, [place], [place] [address]  Chief complaint: Consult for asthma, dyspnea  HPI: 85 year old with past medical history of asthma, obesity Hospitalized from 7/4-7/18/2019 with dyspnea, chest pain she had negative cardiac enzymes, d-dimer.  Treated for asthma exacerbation with Solu-Medrol.  Referred to pulmonary for evaluation Complains of dyspnea with exertion, nonproductive cough, wheezing, occasional chest tightness.  She notices symptoms with wheezing Denies seasonal allergies.  She has heartburn symptoms.  Pets: No pets, Occupation: Exposures: Smoking history: Travel history: Relevant family history:  Interim History: Complains of stable symptoms.  She has nonproductive cough at night, occasional wheeze.  No chest pain, dyspnea, sputum production.  Outpatient Encounter Medications as of 01/18/2019  Medication Sig  . albuterol (PROAIR HFA) 108 (90 Base) MCG/ACT inhaler inhale 2 puffs every 6 hours if needed for wheezing  . aspirin EC 81 MG tablet Take 1 tablet (81 mg total) by mouth daily.  . budesonide (PULMICORT) 0.25 MG/2ML nebulizer solution Take 2 mLs (0.25 mg total) by nebulization 2 (two) times daily.  . calcium-vitamin D (CALCIUM 500+D HIGH POTENCY) 500-400 MG-UNIT tablet Take 1 tablet by mouth daily.  . fexofenadine (ALLEGRA) 180 MG tablet Take 1 tablet (180 mg total) by mouth daily.  . fluticasone (FLONASE) 50 MCG/ACT nasal spray Place 2 sprays into both nostrils daily.  . Fluticasone-Salmeterol (ADVAIR DISKUS) 500-50 MCG/DOSE AEPB Inhale 1 puff into the lungs 2 (two) times daily.  Marland Kitchen guaiFENesin (MUCINEX) 600 MG 12 hr tablet Take 1 tablet (600 mg total) by mouth 2 (two) times daily.  Marland Kitchen guaiFENesin-dextromethorphan (ROBITUSSIN DM) 100-10 MG/5ML syrup Take 15 mLs by mouth every 4 (four) hours as needed for  cough.  Marland Kitchen ipratropium-albuterol (DUONEB) 0.5-2.5 (3) MG/3ML SOLN Take 3 mLs by nebulization every 6 (six) hours.  Marland Kitchen losartan (COZAAR) 25 MG tablet Take 1 tablet (25 mg total) by mouth daily.  . naproxen sodium (ALEVE) 220 MG tablet Take 220 mg by mouth daily as needed.  . pantoprazole (PROTONIX) 40 MG tablet Take 1 tablet (40 mg total) by mouth daily.  . pravastatin (PRAVACHOL) 10 MG tablet Take 1 tablet (10 mg total) by mouth daily.   No facility-administered encounter medications on file as of 01/18/2019.    Physical Exam: Blood pressure (!) 142/62, pulse 95, height 5\' 3"  (1.6 m), weight 249 lb (112.9 kg), SpO2 93 %. Gen:      No acute distress HEENT:  EOMI, sclera anicteric Neck:     No masses; no thyromegaly Lungs:    Clear to auscultation bilaterally; normal respiratory effort CV:         Regular rate and rhythm; no murmurs Abd:      + bowel sounds; soft, non-tender; no palpable masses, no distension Ext:    No edema; adequate peripheral perfusion Skin:      Warm and dry; no rash Neuro: alert and oriented x 3 Psych: normal mood and affect  Data Reviewed: Imaging: CT chest 07/25/2012- no acute cardiopulmonary disease, mild cylindrical bronchiectasis in the medial aspect of right lower lobe. Chest x-ray 07/11/2018-clear lungs, no acute abnormality.  I have reviewed the images personally  PFTs: 01/18/2019 FVC 1.47 [79%], FEV1 1.25 [88%], F/F 85 Normal spirometry.  Could not complete diffusion testing and lung volume testing.  Labs: CBC 09/07/2018-WBC 10.1, eos 1.8%, absolute eosinophil count 181  IgE 09/28/2018-33  Assessment:  Consult for asthma Symptoms are stable on Symbicort PFTs reviewed with no significant obstruction I am not convinced that asthma is a significant issue here Suspect that most of her symptoms are due to body habitus and obesity  Reduce the Symbicort dose to 80/4.5 and reassess at next visit if we can take it off altogether Discussed weight loss with diet  and exercise.  We will make a referral to weight loss clinic.  Bronchiectasis Previous CT from 2013 noted for very mild bronchiectasis in the right lower lobe which may be sequelae of prior infection.  We will continue to monitor this.  Plan/Recommendations: - Referral to weight loss clinic - Reduce Symbicort 80/4.5  Marshell Garfinkel MD [facility] 01/18/2019, 10:08 AM  CC: Billie Ruddy, MD

## 2019-03-05 ENCOUNTER — Other Ambulatory Visit: Payer: Self-pay | Admitting: Family Medicine

## 2019-03-05 DIAGNOSIS — E782 Mixed hyperlipidemia: Secondary | ICD-10-CM

## 2019-04-13 ENCOUNTER — Other Ambulatory Visit: Payer: Self-pay | Admitting: Family Medicine

## 2019-04-13 NOTE — Telephone Encounter (Signed)
Not on active medication list please advise

## 2019-05-31 DIAGNOSIS — Z961 Presence of intraocular lens: Secondary | ICD-10-CM | POA: Diagnosis not present

## 2019-05-31 DIAGNOSIS — H04123 Dry eye syndrome of bilateral lacrimal glands: Secondary | ICD-10-CM | POA: Diagnosis not present

## 2019-05-31 DIAGNOSIS — H02831 Dermatochalasis of right upper eyelid: Secondary | ICD-10-CM | POA: Diagnosis not present

## 2019-05-31 DIAGNOSIS — H52203 Unspecified astigmatism, bilateral: Secondary | ICD-10-CM | POA: Diagnosis not present

## 2019-05-31 DIAGNOSIS — H524 Presbyopia: Secondary | ICD-10-CM | POA: Diagnosis not present

## 2019-05-31 DIAGNOSIS — H5213 Myopia, bilateral: Secondary | ICD-10-CM | POA: Diagnosis not present

## 2019-05-31 DIAGNOSIS — H02834 Dermatochalasis of left upper eyelid: Secondary | ICD-10-CM | POA: Diagnosis not present

## 2019-07-27 ENCOUNTER — Other Ambulatory Visit: Payer: Self-pay | Admitting: Family Medicine

## 2019-08-24 ENCOUNTER — Other Ambulatory Visit: Payer: Self-pay | Admitting: Family Medicine

## 2019-08-24 DIAGNOSIS — J4520 Mild intermittent asthma, uncomplicated: Secondary | ICD-10-CM

## 2019-08-24 MED ORDER — LOSARTAN POTASSIUM 25 MG PO TABS: 25.0000 mg | ORAL_TABLET | Freq: Every day | ORAL | 3 refills | Status: DC

## 2019-08-24 NOTE — Telephone Encounter (Signed)
Pt requesting refill on (PROAIR HFA) 180, please advise

## 2019-08-24 NOTE — Telephone Encounter (Signed)
Requested medication (s) are due for refill today: yes  Requested medication (s) are on the active medication list: yes  Last refill:  08/2018  Future visit scheduled: no  Notes to clinic:  Review for refill  Requested Prescriptions  Pending Prescriptions Disp Refills   albuterol (PROAIR HFA) 108 (90 Base) MCG/ACT inhaler      Sig: inhale 2 puffs every 6 hours if needed for wheezing     Pulmonology:  Beta Agonists Failed - 08/24/2019  1:27 PM      Failed - One inhaler should last at least one month. If the patient is requesting refills earlier, contact the patient to check for uncontrolled symptoms.      Passed - Valid encounter within last 12 months    Recent Outpatient Visits          10 months ago Bilateral lower extremity edema   Therapist, music at [place], MD   1 year ago Essential hypertension   Therapist, music at [facility], Langley Adie, MD              losartan (COZAAR) 25 MG tablet 90 tablet 3    Sig: Take 1 tablet (25 mg total) by mouth daily.     Cardiovascular:  Angiotensin Receptor Blockers Failed - 08/24/2019  1:27 PM      Failed - Cr in normal range and within 180 days    Creat  Date Value Ref Range Status  02/01/2018 0.67 0.60 - 0.88 mg/dL Final    Comment:    For patients >2 years of age, the reference limit for Creatinine is approximately 13% higher for people identified as African-American. .    Creatinine, Ser  Date Value Ref Range Status  10/04/2018 0.66 0.40 - 1.20 mg/dL Final         Failed - K in normal range and within 180 days    Potassium  Date Value Ref Range Status  10/04/2018 4.5 3.5 - 5.1 mEq/L Final         Failed - Last BP in normal range    BP Readings from Last 1 Encounters:  01/18/19 (!) 142/62         Failed - Valid encounter within last 6 months    Recent Outpatient Visits          10 months ago Bilateral lower extremity edema   Therapist, music at [place], MD   1 year ago Essential hypertension   Therapist, music at [place], MD             Passed - Patient is not pregnant

## 2019-08-24 NOTE — Telephone Encounter (Signed)
Medication Refill - Medication: fluticasone (FLONASE) 50 MCG/ACT nasal spray, albuterol (PROAIR HFA) 108 (90 Base) MCG/ACT inhaler, losartan (COZAAR) 25 MG tablet      Has the patient contacted their pharmacy? No. (Agent: If no, request that the patient contact the pharmacy for the refill.) (Agent: If yes, when and what did the pharmacy advise?)  Preferred Pharmacy (with phone number or street name):  [facility] [phone] - [address], [address]  [place] [address]  Phone: [phone] Fax: [phone]  Not a 24 hour pharmacy; exact hours not known.     Agent: Please be advised that RX refills may take up to 3 business days. We ask that you follow-up with your pharmacy.

## 2019-08-24 NOTE — Telephone Encounter (Signed)
Pt needs an appointment for further refills  

## 2019-09-01 ENCOUNTER — Other Ambulatory Visit: Payer: Self-pay | Admitting: Family Medicine

## 2019-09-01 DIAGNOSIS — E782 Mixed hyperlipidemia: Secondary | ICD-10-CM

## 2019-09-17 ENCOUNTER — Emergency Department (HOSPITAL_COMMUNITY): Payer: Medicare Other

## 2019-09-17 ENCOUNTER — Inpatient Hospital Stay (HOSPITAL_COMMUNITY)
Admission: EM | Admit: 2019-09-17 | Discharge: 2019-09-29 | DRG: 064 | Disposition: A | Discharge status: Hospice | Payer: Medicare Other | Attending: Neurology | Admitting: Neurology

## 2019-09-17 ENCOUNTER — Inpatient Hospital Stay (HOSPITAL_COMMUNITY): Payer: Medicare Other

## 2019-09-17 DIAGNOSIS — Z8261 Family history of arthritis: Secondary | ICD-10-CM

## 2019-09-17 DIAGNOSIS — R29810 Facial weakness: Secondary | ICD-10-CM | POA: Diagnosis present

## 2019-09-17 DIAGNOSIS — G8194 Hemiplegia, unspecified affecting left nondominant side: Secondary | ICD-10-CM | POA: Diagnosis present

## 2019-09-17 DIAGNOSIS — I610 Nontraumatic intracerebral hemorrhage in hemisphere, subcortical: Secondary | ICD-10-CM | POA: Diagnosis not present

## 2019-09-17 DIAGNOSIS — G936 Cerebral edema: Secondary | ICD-10-CM | POA: Diagnosis present

## 2019-09-17 DIAGNOSIS — R404 Transient alteration of awareness: Secondary | ICD-10-CM | POA: Diagnosis not present

## 2019-09-17 DIAGNOSIS — E46 Unspecified protein-calorie malnutrition: Secondary | ICD-10-CM | POA: Diagnosis present

## 2019-09-17 DIAGNOSIS — R509 Fever, unspecified: Secondary | ICD-10-CM | POA: Diagnosis not present

## 2019-09-17 DIAGNOSIS — Z79899 Other long term (current) drug therapy: Secondary | ICD-10-CM | POA: Diagnosis not present

## 2019-09-17 DIAGNOSIS — Z888 Allergy status to other drugs, medicaments and biological substances status: Secondary | ICD-10-CM

## 2019-09-17 DIAGNOSIS — Z931 Gastrostomy status: Secondary | ICD-10-CM | POA: Diagnosis not present

## 2019-09-17 DIAGNOSIS — Z515 Encounter for palliative care: Secondary | ICD-10-CM | POA: Diagnosis not present

## 2019-09-17 DIAGNOSIS — E785 Hyperlipidemia, unspecified: Secondary | ICD-10-CM | POA: Diagnosis present

## 2019-09-17 DIAGNOSIS — I10 Essential (primary) hypertension: Secondary | ICD-10-CM | POA: Diagnosis present

## 2019-09-17 DIAGNOSIS — Z880 Allergy status to penicillin: Secondary | ICD-10-CM

## 2019-09-17 DIAGNOSIS — R4781 Slurred speech: Secondary | ICD-10-CM | POA: Diagnosis not present

## 2019-09-17 DIAGNOSIS — Z66 Do not resuscitate: Secondary | ICD-10-CM | POA: Diagnosis not present

## 2019-09-17 DIAGNOSIS — Z823 Family history of stroke: Secondary | ICD-10-CM

## 2019-09-17 DIAGNOSIS — I609 Nontraumatic subarachnoid hemorrhage, unspecified: Secondary | ICD-10-CM | POA: Diagnosis present

## 2019-09-17 DIAGNOSIS — F329 Major depressive disorder, single episode, unspecified: Secondary | ICD-10-CM | POA: Diagnosis present

## 2019-09-17 DIAGNOSIS — Z885 Allergy status to narcotic agent status: Secondary | ICD-10-CM

## 2019-09-17 DIAGNOSIS — J45909 Unspecified asthma, uncomplicated: Secondary | ICD-10-CM | POA: Diagnosis present

## 2019-09-17 DIAGNOSIS — I619 Nontraumatic intracerebral hemorrhage, unspecified: Secondary | ICD-10-CM | POA: Diagnosis present

## 2019-09-17 DIAGNOSIS — E7800 Pure hypercholesterolemia, unspecified: Secondary | ICD-10-CM | POA: Diagnosis present

## 2019-09-17 DIAGNOSIS — M79651 Pain in right thigh: Secondary | ICD-10-CM | POA: Diagnosis not present

## 2019-09-17 DIAGNOSIS — R1310 Dysphagia, unspecified: Secondary | ICD-10-CM

## 2019-09-17 DIAGNOSIS — I615 Nontraumatic intracerebral hemorrhage, intraventricular: Secondary | ICD-10-CM | POA: Diagnosis not present

## 2019-09-17 DIAGNOSIS — R7303 Prediabetes: Secondary | ICD-10-CM | POA: Diagnosis present

## 2019-09-17 DIAGNOSIS — I629 Nontraumatic intracranial hemorrhage, unspecified: Secondary | ICD-10-CM | POA: Diagnosis not present

## 2019-09-17 DIAGNOSIS — Z8679 Personal history of other diseases of the circulatory system: Secondary | ICD-10-CM

## 2019-09-17 DIAGNOSIS — I161 Hypertensive emergency: Secondary | ICD-10-CM | POA: Diagnosis not present

## 2019-09-17 DIAGNOSIS — Z20828 Contact with and (suspected) exposure to other viral communicable diseases: Secondary | ICD-10-CM | POA: Diagnosis present

## 2019-09-17 DIAGNOSIS — Z4659 Encounter for fitting and adjustment of other gastrointestinal appliance and device: Secondary | ICD-10-CM

## 2019-09-17 DIAGNOSIS — Z7951 Long term (current) use of inhaled steroids: Secondary | ICD-10-CM

## 2019-09-17 DIAGNOSIS — R1312 Dysphagia, oropharyngeal phase: Secondary | ICD-10-CM | POA: Diagnosis not present

## 2019-09-17 DIAGNOSIS — Z87891 Personal history of nicotine dependence: Secondary | ICD-10-CM

## 2019-09-17 DIAGNOSIS — Z7982 Long term (current) use of aspirin: Secondary | ICD-10-CM

## 2019-09-17 DIAGNOSIS — Z6841 Body Mass Index (BMI) 40.0 and over, adult: Secondary | ICD-10-CM

## 2019-09-17 DIAGNOSIS — G935 Compression of brain: Secondary | ICD-10-CM | POA: Diagnosis present

## 2019-09-17 DIAGNOSIS — Z7189 Other specified counseling: Secondary | ICD-10-CM

## 2019-09-17 DIAGNOSIS — R471 Dysarthria and anarthria: Secondary | ICD-10-CM | POA: Diagnosis present

## 2019-09-17 DIAGNOSIS — R29727 NIHSS score 27: Secondary | ICD-10-CM | POA: Diagnosis present

## 2019-09-17 DIAGNOSIS — Z833 Family history of diabetes mellitus: Secondary | ICD-10-CM

## 2019-09-17 DIAGNOSIS — E876 Hypokalemia: Secondary | ICD-10-CM | POA: Diagnosis not present

## 2019-09-17 DIAGNOSIS — R531 Weakness: Secondary | ICD-10-CM | POA: Diagnosis not present

## 2019-09-17 DIAGNOSIS — J4520 Mild intermittent asthma, uncomplicated: Secondary | ICD-10-CM

## 2019-09-17 DIAGNOSIS — I6389 Other cerebral infarction: Secondary | ICD-10-CM | POA: Diagnosis not present

## 2019-09-17 DIAGNOSIS — R4182 Altered mental status, unspecified: Secondary | ICD-10-CM | POA: Diagnosis not present

## 2019-09-17 DIAGNOSIS — J9811 Atelectasis: Secondary | ICD-10-CM | POA: Diagnosis not present

## 2019-09-17 DIAGNOSIS — I69354 Hemiplegia and hemiparesis following cerebral infarction affecting left non-dominant side: Secondary | ICD-10-CM | POA: Diagnosis not present

## 2019-09-17 DIAGNOSIS — Z791 Long term (current) use of non-steroidal anti-inflammatories (NSAID): Secondary | ICD-10-CM

## 2019-09-17 DIAGNOSIS — R918 Other nonspecific abnormal finding of lung field: Secondary | ICD-10-CM | POA: Diagnosis not present

## 2019-09-17 DIAGNOSIS — Z4682 Encounter for fitting and adjustment of non-vascular catheter: Secondary | ICD-10-CM | POA: Diagnosis not present

## 2019-09-17 DIAGNOSIS — I618 Other nontraumatic intracerebral hemorrhage: Secondary | ICD-10-CM | POA: Diagnosis present

## 2019-09-17 DIAGNOSIS — I611 Nontraumatic intracerebral hemorrhage in hemisphere, cortical: Secondary | ICD-10-CM | POA: Diagnosis not present

## 2019-09-17 DIAGNOSIS — Z803 Family history of malignant neoplasm of breast: Secondary | ICD-10-CM

## 2019-09-17 DIAGNOSIS — Z8249 Family history of ischemic heart disease and other diseases of the circulatory system: Secondary | ICD-10-CM

## 2019-09-17 DIAGNOSIS — Z741 Need for assistance with personal care: Secondary | ICD-10-CM | POA: Diagnosis not present

## 2019-09-17 DIAGNOSIS — Z90710 Acquired absence of both cervix and uterus: Secondary | ICD-10-CM

## 2019-09-17 DIAGNOSIS — Z03818 Encounter for observation for suspected exposure to other biological agents ruled out: Secondary | ICD-10-CM | POA: Diagnosis not present

## 2019-09-17 DIAGNOSIS — R197 Diarrhea, unspecified: Secondary | ICD-10-CM | POA: Diagnosis present

## 2019-09-17 LAB — CBC
HCT: 48.3 % — ABNORMAL HIGH (ref 36.0–46.0)
Hemoglobin: 14.4 g/dL (ref 12.0–15.0)
MCH: 26.1 pg (ref 26.0–34.0)
MCHC: 29.8 g/dL — ABNORMAL LOW (ref 30.0–36.0)
MCV: 87.7 fL (ref 80.0–100.0)
Platelets: 363 10*3/uL (ref 150–400)
RBC: 5.51 MIL/uL — ABNORMAL HIGH (ref 3.87–5.11)
RDW: 15.3 % (ref 11.5–15.5)
WBC: 10.4 10*3/uL (ref 4.0–10.5)
nRBC: 0 % (ref 0.0–0.2)

## 2019-09-17 LAB — DIFFERENTIAL
Abs Immature Granulocytes: 0.03 10*3/uL (ref 0.00–0.07)
Basophils Absolute: 0.1 10*3/uL (ref 0.0–0.1)
Basophils Relative: 1 %
Eosinophils Absolute: 0.2 10*3/uL (ref 0.0–0.5)
Eosinophils Relative: 1 %
Immature Granulocytes: 0 %
Lymphocytes Relative: 36 %
Lymphs Abs: 3.7 10*3/uL (ref 0.7–4.0)
Monocytes Absolute: 1 10*3/uL (ref 0.1–1.0)
Monocytes Relative: 10 %
Neutro Abs: 5.4 10*3/uL (ref 1.7–7.7)
Neutrophils Relative %: 52 %

## 2019-09-17 LAB — COMPREHENSIVE METABOLIC PANEL
ALT: 20 U/L (ref 0–44)
AST: 27 U/L (ref 15–41)
Albumin: 4 g/dL (ref 3.5–5.0)
Alkaline Phosphatase: 56 U/L (ref 38–126)
Anion gap: 12 (ref 5–15)
BUN: 11 mg/dL (ref 8–23)
CO2: 21 mmol/L — ABNORMAL LOW (ref 22–32)
Calcium: 9.4 mg/dL (ref 8.9–10.3)
Chloride: 109 mmol/L (ref 98–111)
Creatinine, Ser: 0.78 mg/dL (ref 0.44–1.00)
GFR calc Af Amer: >60 mL/min (ref >60)
GFR calc non Af Amer: >60 mL/min (ref >60)
Glucose, Bld: 110 mg/dL — ABNORMAL HIGH (ref 70–99)
Potassium: 4.5 mmol/L (ref 3.5–5.1)
Sodium: 142 mmol/L (ref 135–145)
Total Bilirubin: 1 mg/dL (ref 0.3–1.2)
Total Protein: 7.5 g/dL (ref 6.5–8.1)

## 2019-09-17 LAB — I-STAT CHEM 8, ED
BUN: 12 mg/dL (ref 8–23)
Calcium, Ion: 1.08 mmol/L — ABNORMAL LOW (ref 1.15–1.40)
Chloride: 108 mmol/L (ref 98–111)
Creatinine, Ser: 0.6 mg/dL (ref 0.44–1.00)
Glucose, Bld: 107 mg/dL — ABNORMAL HIGH (ref 70–99)
HCT: 45 % (ref 36.0–46.0)
Hemoglobin: 15.3 g/dL — ABNORMAL HIGH (ref 12.0–15.0)
Potassium: 4 mmol/L (ref 3.5–5.1)
Sodium: 142 mmol/L (ref 135–145)
TCO2: 21 mmol/L — ABNORMAL LOW (ref 22–32)

## 2019-09-17 LAB — SARS CORONAVIRUS 2 BY RT PCR (HOSPITAL ORDER, PERFORMED IN ~~LOC~~ HOSPITAL LAB): SARS Coronavirus 2: NEGATIVE

## 2019-09-17 LAB — SODIUM: Sodium: 142 mmol/L (ref 135–145)

## 2019-09-17 LAB — CBG MONITORING, ED: Glucose-Capillary: 109 mg/dL — ABNORMAL HIGH (ref 70–99)

## 2019-09-17 LAB — PROTIME-INR
INR: 1 (ref 0.8–1.2)
Prothrombin Time: 13.2 seconds (ref 11.4–15.2)

## 2019-09-17 LAB — MRSA PCR SCREENING: MRSA by PCR: NEGATIVE

## 2019-09-17 LAB — APTT: aPTT: 29 seconds (ref 24–36)

## 2019-09-17 MED ORDER — CLEVIDIPINE BUTYRATE 0.5 MG/ML IV EMUL
0.0000 mg/h | INTRAVENOUS | Status: DC
  Administered 2019-09-17: 21 mg/h via INTRAVENOUS
  Administered 2019-09-17: 1 mg/h via INTRAVENOUS
  Administered 2019-09-17 (×3): 21 mg/h via INTRAVENOUS
  Administered 2019-09-18: 12 mg/h via INTRAVENOUS
  Administered 2019-09-18: 16 mg/h via INTRAVENOUS
  Administered 2019-09-18: 14 mg/h via INTRAVENOUS
  Administered 2019-09-18: 12 mg/h via INTRAVENOUS
  Administered 2019-09-18: 10 mg/h via INTRAVENOUS
  Administered 2019-09-19: 17 mg/h via INTRAVENOUS
  Administered 2019-09-19 (×2): 21 mg/h via INTRAVENOUS
  Administered 2019-09-19: 18:00:00 14 mg/h via INTRAVENOUS
  Administered 2019-09-19: 06:00:00 21 mg/h via INTRAVENOUS
  Administered 2019-09-19: 09:00:00 18 mg/h via INTRAVENOUS
  Filled 2019-09-17 (×5): qty 50
  Filled 2019-09-17: qty 100
  Filled 2019-09-17 (×12): qty 50

## 2019-09-17 MED ORDER — SENNOSIDES-DOCUSATE SODIUM 8.6-50 MG PO TABS
1.0000 | ORAL_TABLET | Freq: Two times a day (BID) | ORAL | Status: DC
  Administered 2019-09-27: 10:00:00 1 via ORAL
  Filled 2019-09-17 (×2): qty 1

## 2019-09-17 MED ORDER — SODIUM CHLORIDE 0.9% FLUSH: 3.0000 mL | Freq: Once | INTRAVENOUS | Status: DC

## 2019-09-17 MED ORDER — IOHEXOL 350 MG/ML SOLN
75.0000 mL | Freq: Once | INTRAVENOUS | Status: AC | PRN
  Administered 2019-09-17: 75 mL via INTRAVENOUS

## 2019-09-17 MED ORDER — ORAL CARE MOUTH RINSE
15.0000 mL | Freq: Two times a day (BID) | OROMUCOSAL | Status: DC
  Administered 2019-09-18 – 2019-09-29 (×21): 15 mL via OROMUCOSAL

## 2019-09-17 MED ORDER — ACETAMINOPHEN 650 MG RE SUPP
650.0000 mg | RECTAL | Status: DC | PRN
  Administered 2019-09-17 – 2019-09-20 (×4): 650 mg via RECTAL
  Filled 2019-09-17 (×4): qty 1

## 2019-09-17 MED ORDER — CHLORHEXIDINE GLUCONATE CLOTH 2 % EX PADS
6.0000 | MEDICATED_PAD | Freq: Every day | CUTANEOUS | Status: DC
  Administered 2019-09-17 – 2019-09-24 (×8): 6 via TOPICAL

## 2019-09-17 MED ORDER — ACETAMINOPHEN 160 MG/5ML PO SOLN
650.0000 mg | ORAL | Status: DC | PRN
  Administered 2019-09-21 – 2019-09-23 (×3): 650 mg
  Filled 2019-09-17 (×4): qty 20.3

## 2019-09-17 MED ORDER — STROKE: EARLY STAGES OF RECOVERY BOOK
Freq: Once | Status: AC
  Administered 2019-09-17: 14:00:00

## 2019-09-17 MED ORDER — CHLORHEXIDINE GLUCONATE 0.12 % MT SOLN
15.0000 mL | Freq: Two times a day (BID) | OROMUCOSAL | Status: DC
  Administered 2019-09-17 – 2019-09-29 (×23): 15 mL via OROMUCOSAL
  Filled 2019-09-17 (×14): qty 15

## 2019-09-17 MED ORDER — METOPROLOL TARTRATE 5 MG/5ML IV SOLN
5.0000 mg | Freq: Once | INTRAVENOUS | Status: AC
  Administered 2019-09-17: 5 mg via INTRAVENOUS
  Filled 2019-09-17: qty 5

## 2019-09-17 MED ORDER — ACETAMINOPHEN 325 MG PO TABS
650.0000 mg | ORAL_TABLET | ORAL | Status: DC | PRN
  Administered 2019-09-22: 650 mg via ORAL
  Filled 2019-09-17: qty 2

## 2019-09-17 MED ORDER — SODIUM CHLORIDE 3 % IV SOLN
INTRAVENOUS | Status: AC
  Administered 2019-09-17: 50 mL/h via INTRAVENOUS
  Filled 2019-09-17 (×5): qty 500

## 2019-09-17 MED ORDER — DIPHENHYDRAMINE HCL 50 MG/ML IJ SOLN
50.0000 mg | Freq: Once | INTRAMUSCULAR | Status: AC
  Administered 2019-09-17: 50 mg via INTRAVENOUS
  Filled 2019-09-17: qty 1

## 2019-09-17 MED ORDER — PANTOPRAZOLE SODIUM 40 MG IV SOLR
40.0000 mg | Freq: Every day | INTRAVENOUS | Status: DC
  Administered 2019-09-17 – 2019-09-23 (×7): 40 mg via INTRAVENOUS
  Filled 2019-09-17 (×7): qty 40

## 2019-09-17 NOTE — ED Notes (Signed)
Daughter at bedside.

## 2019-09-17 NOTE — ED Notes (Signed)
Pt having large amounts of secretions and appears to be having difficulty clearing secretions with less response.  Suctioned and MD called, pt improved rapidly and had appropriate verbal responses.

## 2019-09-17 NOTE — ED Notes (Signed)
BP continues to be elevated at this time with Cliviprex at max dose.  Pt does not appear in pain, Dr. Max Fickle.

## 2019-09-17 NOTE — ED Notes (Addendum)
Placed on oxygen @ 3L/[place] slightly agitated at devices yet calms.

## 2019-09-17 NOTE — ED Notes (Addendum)
Pt requiring suctioning d/t inability to clear her secretions at this time.  Can communicate her needs through slurred speech.  Daughter at beside, teaching done.  BP and HR spikes during episode.

## 2019-09-17 NOTE — ED Provider Notes (Signed)
[facility] EMERGENCY DEPARTMENT Provider Note   CSN: [id number] Arrival date & time: 09/17/19  1305    LEVEL 5 CAVEAT - ALTERED MENTAL STATUS   History   Chief Complaint No chief complaint on file.   HPI [name] is a 85 y.o. female.     HPI  85 year old female presents with sudden onset left-sided weakness.  Within the code stroke window.  Apparently was seen by daughter when this occurred.  Further history is extremely limited.  Past Medical History:  Diagnosis Date  . Asthma   . Back pain   . Depression   . Family history of arthritis   . Family history of diabetes mellitus   . Family history of ischemic heart disease   . Headache(784.0)   . High blood pressure   . High cholesterol   . Hyperlipidemia   . Hypertension   . Obesity   . Pain from implanted hardware 01/28/2012  . Personal history of unspecified circulatory disease   . Sciatica   . Shortness of breath   . Urinary incontinence     Patient Active Problem List   Diagnosis Date Noted  . ICH (intracerebral hemorrhage) ([place]) 09/17/2019  . Essential hypertension 08/17/2018  . Hospital discharge follow-up 08/03/2018  . Bronchitis, asthmatic, mild intermittent, uncomplicated 0000000  . Primary osteoarthritis of both knees 05/16/2016  . Sleep disorder 08/25/2015  . Reduced vision 03/19/2015  . Seasonal allergies 11/18/2014  . Unspecified vitamin D deficiency 05/13/2014  . Asthma, chronic 08/07/2013  . Vitamin D deficiency 04/10/2012  . Right leg weakness 11/10/2011  . Multiple falls 10/31/2011  . DEGENERATIVE DISC DISEASE, LUMBOSACRAL SPINE W/RADICULOPATHY 10/07/2010  . IGT (impaired glucose tolerance) 09/07/2010  . OTHER OSTEOPOROSIS 08/22/2008  . Hyperlipidemia 02/07/2008  . Morbid obesity ([place]) 02/07/2008  . SCIATICA 01/04/2008  . Back pain 01/04/2008    Past Surgical History:  Procedure Laterality Date  . ABDOMINAL HYSTERECTOMY    . BACK SURGERY  2009   Dr.  Ronnald Ramp [place] [facility]  . COLONOSCOPY    . HARDWARE REMOVAL  01/28/2012   Procedure: HARDWARE REMOVAL;  Surgeon: Johnny Bridge, MD;  Location: [facility];  Service: Orthopedics;  Laterality: Right;  Right Knee  . right knee surgery after fracture  2006  . SPINE SURGERY       OB History   No obstetric history on file.      Home Medications    Prior to Admission medications   Medication Sig Start Date End Date Taking? Authorizing Provider  albuterol (PROAIR HFA) 108 (90 Base) MCG/ACT inhaler inhale 2 puffs every 6 hours if needed for wheezing 09/07/18  Yes Mannam, Praveen, MD  losartan (COZAAR) 25 MG tablet Take 1 tablet (25 mg total) by mouth daily. 08/24/19  Yes Billie Ruddy, MD  pravastatin (PRAVACHOL) 10 MG tablet TAKE 1 TABLET(10 MG) BY MOUTH DAILY Patient taking differently: Take 10 mg by mouth daily.  09/04/19  Yes Billie Ruddy, MD    Family History Family History  Problem Relation Age of Onset  . Breast cancer Mother        family history   . Hypertension Mother   . Diabetes Mother   . Hypertension Father   . Stroke Father   . Hypertension Sister   . Hypertension Brother   . Hypertension Brother   . Hypertension Brother   . Diabetes Sister   . Stroke Brother   . Heart defect Other  family history   . Colon cancer Neg Hx   . Colon polyps Neg Hx   . Esophageal cancer Neg Hx     Social History Social History   Tobacco Use  . Smoking status: Former Smoker    Packs/day: 0.50    Years: 30.00    Pack years: 15.00    Types: Cigarettes    Quit date: 12/27/1993    Years since quitting: 25.7  . Smokeless tobacco: Never Used  Substance Use Topics  . Alcohol use: No    Alcohol/week: 0.0 standard drinks  . Drug use: No     Allergies   Amlodipine, Oxycodone, Penicillins, Pravastatin, and Tramadol   Review of Systems Review of Systems  Unable to perform ROS: Mental status change     Physical Exam Updated Vital Signs BP  127/63 (BP Location: Right Arm)   Pulse 96   Temp 98.5 F (36.9 C) (Oral)   Resp (!) 31   Wt 111.3 kg   SpO2 93%   BMI 43.47 kg/m   Physical Exam Vitals signs and nursing note reviewed.  Constitutional:      Appearance: She is well-developed.  HENT:     Head: Normocephalic and atraumatic.     Right Ear: External ear normal.     Left Ear: External ear normal.     Nose: Nose normal.  Eyes:     General:        Right eye: No discharge.        Left eye: No discharge.  Cardiovascular:     Rate and Rhythm: Normal rate and regular rhythm.     Heart sounds: Normal heart sounds.  Pulmonary:     Effort: Pulmonary effort is normal.     Breath sounds: Normal breath sounds.  Abdominal:     Palpations: Abdomen is soft.     Tenderness: There is no abdominal tenderness.  Skin:    General: Skin is warm and dry.  Neurological:     Mental Status: She is alert.     Comments: Awake, alert, follows some commands. RUE, RLE with good strength, flaccid on LUE, LLE  Psychiatric:        Mood and Affect: Mood is not anxious.      ED Treatments / Results  Labs (all labs ordered are listed, but only abnormal results are displayed) Labs Reviewed  CBC - Abnormal; Notable for the following components:      Result Value   RBC 5.51 (*)    HCT 48.3 (*)    MCHC 29.8 (*)    All other components within normal limits  COMPREHENSIVE METABOLIC PANEL - Abnormal; Notable for the following components:   CO2 21 (*)    Glucose, Bld 110 (*)    All other components within normal limits  I-STAT CHEM 8, ED - Abnormal; Notable for the following components:   Glucose, Bld 107 (*)    Calcium, Ion 1.08 (*)    TCO2 21 (*)    Hemoglobin 15.3 (*)    All other components within normal limits  CBG MONITORING, ED - Abnormal; Notable for the following components:   Glucose-Capillary 109 (*)    All other components within normal limits  SARS CORONAVIRUS 2 (HOSPITAL ORDER, [facility])   PROTIME-INR  APTT  DIFFERENTIAL  SODIUM  SODIUM    EKG None  Radiology Ct Angio Head W Or Wo Contrast  Result Date: 09/17/2019 CLINICAL DATA:  Left facial droop. Follow-up  intracranial hemorrhage EXAM: CT ANGIOGRAPHY HEAD TECHNIQUE: Multidetector CT imaging of the head was performed using the standard protocol during bolus administration of intravenous contrast. Multiplanar CT image reconstructions and MIPs were obtained to evaluate the vascular anatomy. CONTRAST:  65mL OMNIPAQUE IOHEXOL 350 MG/ML SOLN COMPARISON:  Written head CT earlier same day FINDINGS: CTA HEAD Anterior circulation: Both internal carotid arteries are patent through the skull base and siphon regions. No siphon stenosis. The anterior and middle cerebral vessels are patent without proximal stenosis, aneurysm or vascular malformation. No evidence of a high flow vascular lesion in the region of the right basal ganglia hemorrhage. No sign of visible active extravasation. No evidence of hematoma enlargement since the previous study, maximal dimension 4.9 cm front to back. Posterior circulation: Both vertebral arteries are widely patent through the foramen magnum to the basilar. No basilar stenosis. Posterior circulation branch vessels are patent and normal. Venous sinuses: Patent and normal. Anatomic variants: None significant. IMPRESSION: No vascular lesion seen associated with the right basal ganglia hemorrhage. No intracranial large or medium vessel occlusion. No evidence of increased size of the right basal ganglia hemorrhage or of visible active extravasation. Electronically Signed   By: Nelson Chimes [name]   On: 09/17/2019 13:40   Ct Head Code Stroke Wo Contrast  Result Date: 09/17/2019 CLINICAL DATA:  Code stroke. Left facial droop. Measured on Dr. Camille Bal radiology EXAM: CT HEAD WITHOUT CONTRAST TECHNIQUE: Contiguous axial images were obtained from the base of the skull through the vertex without intravenous contrast.  COMPARISON:  None. FINDINGS: Brain: An acute hyperdense hemorrhage in the right basal ganglia measures 4.1 x 2.7 x 4.7 cm. Additional scattered hemorrhage extends more medially in the basal ganglia. There is intraventricular extension. Subarachnoid blood is also noted adjacent to the right temporal tip. There is no hydrocephalus. There is mass effect with 4 mm midline shift. A remote lacunar infarct is present in the left lentiform nucleus. Moderate diffuse white matter disease is again seen. The brainstem and cerebellum are normal. Vascular: Atherosclerotic calcifications are present within the cavernous internal carotid arteries. There is no hyperdense vessel. Skull: Calvarium is intact. No focal lytic or blastic lesions are present. Sinuses/Orbits: The paranasal sinuses and mastoid air cells are clear. Bilateral lens replacements are noted. Globes and orbits are otherwise unremarkable. IMPRESSION: 1. Large right basal ganglia hemorrhage with intraventricular and subarachnoid extension and 4 mm of right-to-left midline shift. Critical Value/emergent results were called by telephone at the time of interpretation on 09/17/2019 at 1:27 pm to providerSCOTT Briceida Rasberry's PA , who verbally acknowledged these results. The above was relayed via text pager to Dr. Cheral Marker on 09/17/2019 at 13:28 . Electronically Signed   By: San Morelle [name]   On: 09/17/2019 13:33    Procedures Procedures (including critical care time)  Medications Ordered in ED Medications  sodium chloride flush (NS) 0.9 % injection 3 mL (3 mLs Intravenous Not Given 09/17/19 1351)  acetaminophen (TYLENOL) tablet 650 mg (has no administration in time range)    Or  acetaminophen (TYLENOL) solution 650 mg (has no administration in time range)    Or  acetaminophen (TYLENOL) suppository 650 mg (has no administration in time range)  senna-docusate (Senokot-S) tablet 1 tablet (1 tablet Oral Not Given 09/17/19 1405)  pantoprazole (PROTONIX)  injection 40 mg (has no administration in time range)  clevidipine (CLEVIPREX) infusion 0.5 mg/mL (21 mg/hr Intravenous Rate/Dose Change 09/17/19 1533)  sodium chloride (hypertonic) 3 % solution (50 mL/hr Intravenous New Bag/Given 09/17/19 1407)  iohexol (OMNIPAQUE) 350 MG/ML injection 75 mL (75 mLs Intravenous Contrast Given 09/17/19 1333)   stroke: mapping our early stages of recovery book ( Does not apply Given 09/17/19 1406)     Initial Impression / Assessment and Plan / ED Course  I have reviewed the triage vital signs and the nursing notes.  Pertinent labs & imaging results that were available during my care of the patient were reviewed by me and considered in my medical decision making (see chart for details).        Patient's mental status has slightly improved and she seems to be a little more awake.  Has always been protecting her airway.  At this point, she is found to have significant intracerebral hemorrhage.  Placed on Cleviprex.  She does have an amlodipine allergy but chart indicates this is more of a side effect.  Otherwise, blood pressure is improved and she will need neuro ICU for management.  [name] was evaluated in Emergency Department on 09/17/2019 for the symptoms described in the history of present illness. She was evaluated in the context of the global COVID-19 pandemic, which necessitated consideration that the patient might be at risk for infection with the SARS-CoV-2 virus that causes COVID-19. Institutional protocols and algorithms that pertain to the evaluation of patients at risk for COVID-19 are in a state of rapid change based on information released by regulatory bodies including the CDC and federal and state organizations. These policies and algorithms were followed during the patient's care in the ED.   Final Clinical Impressions(s) / ED Diagnoses   Final diagnoses:  Right-sided nontraumatic intracerebral hemorrhage, unspecified cerebral location [facility])   Hypertensive emergency    ED Discharge Orders    None       Sherwood Gambler, MD 09/17/19 1539

## 2019-09-17 NOTE — ED Notes (Signed)
All jewelry home with daughter.

## 2019-09-17 NOTE — H&P (Addendum)
Neurology history and physical   History is obtained from: EMS  HPI: [name] is an 85 y.o. female with a history of sciatica, obesity, hypertension, hyperlipidemia and headache.  Patient was last seen normal at 12:00 today.  Per EMS, patient was out shopping with daughter.  They arrived home and as she was going up the stairs she suddenly had left-sided flaccidity along with left facial droop and dysarthria.  EMS was called and patient was immediately brought to [facility].  On arrival patient continued to have left facial droop, right eye gaze deviation, left-sided flaccidity along with no sensation on the left side.  CT was obtained and patient was noted to have an intracranial hemorrhage.  Chart review- patient has not been seen by neurology for any medical issues  LKW: 12 PM on 09/17/2019 tpa given?: no, intracranial hemorrhage Premorbid modified Rankin scale (mRS): 1 NIH stroke scale 27   ROS:  Unable to obtain due to altered mental status.   Past Medical History:  Diagnosis Date  . Asthma   . Back pain   . Depression   . Family history of arthritis   . Family history of diabetes mellitus   . Family history of ischemic heart disease   . Headache(784.0)   . High blood pressure   . High cholesterol   . Hyperlipidemia   . Hypertension   . Obesity   . Pain from implanted hardware 01/28/2012  . Personal history of unspecified circulatory disease   . Sciatica   . Shortness of breath   . Urinary incontinence      Family History  Problem Relation Age of Onset  . Breast cancer Mother        family history   . Hypertension Mother   . Diabetes Mother   . Hypertension Father   . Stroke Father   . Hypertension Sister   . Hypertension Brother   . Hypertension Brother   . Hypertension Brother   . Diabetes Sister   . Stroke Brother   . Heart defect Other        family history   . Colon cancer Neg Hx   . Colon polyps Neg Hx   . Esophageal cancer Neg Hx     Social History:   reports that she quit smoking about 25 years ago. Her smoking use included cigarettes. She has a 15.00 pack-year smoking history. She has never used smokeless tobacco. She reports that she does not drink alcohol or use drugs.  Medications  Current Facility-Administered Medications:  .  sodium chloride flush (NS) 0.9 % injection 3 mL, 3 mL, Intravenous, Once, Sherwood Gambler, MD  Current Outpatient Medications:  .  albuterol (PROAIR HFA) 108 (90 Base) MCG/ACT inhaler, inhale 2 puffs every 6 hours if needed for wheezing, Disp: 1 Inhaler, Rfl: 3 .  aspirin EC 81 MG tablet, Take 1 tablet (81 mg total) by mouth daily., Disp: 100 tablet, Rfl: 2 .  budesonide-formoterol (SYMBICORT) 80-4.5 MCG/ACT inhaler, Inhale 2 puffs into the lungs 2 (two) times daily., Disp: 1 Inhaler, Rfl: 12 .  calcium-vitamin D (CALCIUM 500+D HIGH POTENCY) 500-400 MG-UNIT tablet, Take 1 tablet by mouth daily., Disp: , Rfl:  .  fexofenadine (ALLEGRA) 180 MG tablet, Take 1 tablet (180 mg total) by mouth daily., Disp: 30 tablet, Rfl: 3 .  fluticasone (FLONASE) 50 MCG/ACT nasal spray, Place 2 sprays into both nostrils daily., Disp: 11.1 g, Rfl: 5 .  guaiFENesin (MUCINEX) 600 MG 12 hr tablet,  Take 1 tablet (600 mg total) by mouth 2 (two) times daily., Disp: 60 tablet, Rfl: 0 .  guaiFENesin-dextromethorphan (ROBITUSSIN DM) 100-10 MG/5ML syrup, Take 15 mLs by mouth every 4 (four) hours as needed for cough., Disp: 118 mL, Rfl: 0 .  ipratropium-albuterol (DUONEB) 0.5-2.5 (3) MG/3ML SOLN, Take 3 mLs by nebulization every 6 (six) hours., Disp: 360 mL, Rfl: 0 .  losartan (COZAAR) 25 MG tablet, Take 1 tablet (25 mg total) by mouth daily., Disp: 90 tablet, Rfl: 3 .  naproxen sodium (ALEVE) 220 MG tablet, Take 220 mg by mouth daily as needed., Disp: , Rfl:  .  pantoprazole (PROTONIX) 40 MG tablet, Take 1 tablet (40 mg total) by mouth daily., Disp: 30 tablet, Rfl: 3 .  pravastatin (PRAVACHOL) 10 MG tablet, TAKE 1 TABLET(10  MG) BY MOUTH DAILY, Disp: 90 tablet, Rfl: 1   Exam: Current vital signs: Wt 111.3 kg   BMI 43.47 kg/m  Vital signs in last 24 hours: Weight:  [111.3 kg] 111.3 kg (09/21 1300)  Physical Exam  Constitutional: Appears well-developed and well-nourished.  Psych: Affect appropriate to situation Eyes: No scleral injection HENT: No OP obstrucion Head: Normocephalic.  Cardiovascular: Normal rate and regular rhythm.  Respiratory: Effort normal, non-labored breathing GI: Soft.  No distension. There is no tenderness.  Skin: WDI  Neuro: Mental Status: Patient is awake, able to close her fist when asked.  Able to open and close her eyes when asked.  Able to follow simple commands such as raising her arm and legs. Cranial Nerves: II: Left homonymous hemianopsia III,IV, VI: Right forced gaze deviation. Pupils equal, round and reactive to light V: Decreased sensation on the left side VII: Left facial droop VIII: hearing is intact to voice XI: Head tends to rotate to right XII: tongue is midline without atrophy or fasciculations.  Motor: Right arm and leg able to raise antigravity. LUE and LLE flaccid Sensory: Sensation intact on the right and severely impairedt on the left Deep Tendon Reflexes: 1+ in the upper extremities with no knee jerk Plantars: Mute Cerebellar: No dysmetria with right finger-to-nose. Unable to perform on the left. Unable to do heel to shin secondary to body habitus Gait: Unable to assess  Labs I have reviewed labs in epic and the results pertinent to this consultation are:   CBC    Component Value Date/Time   WBC 10.1 09/07/2018 1127   RBC 5.30 (H) 09/07/2018 1127   HGB 14.0 09/07/2018 1127   HCT 43.7 09/07/2018 1127   PLT 360.0 09/07/2018 1127   MCV 82.3 09/07/2018 1127   MCH 25.5 (L) 07/11/2018 0358   MCHC 32.0 09/07/2018 1127   RDW 15.8 (H) 09/07/2018 1127   LYMPHSABS 3.2 09/07/2018 1127   MONOABS 1.4 (H) 09/07/2018 1127   EOSABS 0.2 09/07/2018  1127   BASOSABS 0.1 09/07/2018 1127    CMP     Component Value Date/Time   NA 141 10/04/2018 1507   K 4.5 10/04/2018 1507   CL 103 10/04/2018 1507   CO2 31 10/04/2018 1507   GLUCOSE 89 10/04/2018 1507   BUN 14 10/04/2018 1507   CREATININE 0.66 10/04/2018 1507   CREATININE 0.67 02/01/2018 1043   CALCIUM 9.9 10/04/2018 1507   PROT 7.4 02/01/2018 1043   ALBUMIN 4.3 06/01/2017 1551   AST 18 02/01/2018 1043   ALT 21 02/01/2018 1043   ALKPHOS 74 06/01/2017 1551   BILITOT 0.7 02/01/2018 1043   GFRNONAA >60 07/11/2018 0358   GFRNONAA 82 02/01/2018  [place] 07/11/2018 0358   GFRAA 96 02/01/2018 1043    Lipid Panel     Component Value Date/Time   CHOL 197 07/10/2018 0607   TRIG 85 07/10/2018 0607   HDL 58 07/10/2018 0607   CHOLHDL 3.4 07/10/2018 0607   VLDL 17 07/10/2018 0607   LDLCALC 122 (H) 07/10/2018 0607   LDLCALC 117 (H) 02/01/2018 1043     Imaging I have reviewed the images obtained:  CT-scan of the brain- Large right basal ganglia hemorrhage with intraventricular and subarachnoid extension and 4 mm of right-to-left midline shift.  CTA head: No vascular lesion seen associated with the right basal ganglia Hemorrhage. No intracranial large or medium vessel occlusion. No evidence of increased size of the right basal ganglia hemorrhage or of visible active extravasation.  Etta Quill PA-C [facility] [phone] 09/17/2019, 1:22 PM   Assessment: 85 year old female presenting to the hospital with sudden onset of left facial droop, left-hemiplegia  and left sided sensory impairment.  1. CT of head was revealed a right parenchymal/thalamic hemorrhage with intraventricular and subarachnoid extension, as well as 4 mm right to left midline shift.  2. Etiology of ICH most likely hypertensive based on the location and overall appearance on CT   Plan: 1. Admitting to the ICU under the Neurology service.  2. BP control goal SYS<140 3. PT/OT/ST  4.  Frequent neuro checks 5. NPO until cleared by speech 6. Prophylaxis: DVT: SCDs. GI: Protonix. Bowel: Senokot 7. No antiplatelet medications or anticoagulants. DVT prophylaxis with SCDs 8. Repeat CT head in 12 hours to assess for stability of the hemorrhage and mass effect 9. Hypertonic saline 10. HOB 30-45 degrees 11. MRI brain when able to tolerate 12. The patient's daughter can be contacted at [phone]  I have seen and examined the patient. I have formulated the assessment and plan. My examination was observed and documented by Etta Quill, PA. 85 year old female with large right basal ganglia hypertensive hemorrhage. Exam and assessment/plan as above.  Electronically signed: Dr. Kerney Elbe

## 2019-09-17 NOTE — Progress Notes (Signed)
Pt noted to have slight bilateral eye swelling. MD Lindzen made aware, verbal order received to give 50 mg benadryl IV once and 650 mg tylenol

## 2019-09-18 ENCOUNTER — Inpatient Hospital Stay (HOSPITAL_COMMUNITY): Payer: Medicare Other

## 2019-09-18 DIAGNOSIS — R1312 Dysphagia, oropharyngeal phase: Secondary | ICD-10-CM

## 2019-09-18 DIAGNOSIS — I615 Nontraumatic intracerebral hemorrhage, intraventricular: Principal | ICD-10-CM

## 2019-09-18 DIAGNOSIS — I6389 Other cerebral infarction: Secondary | ICD-10-CM

## 2019-09-18 DIAGNOSIS — I611 Nontraumatic intracerebral hemorrhage in hemisphere, cortical: Secondary | ICD-10-CM

## 2019-09-18 DIAGNOSIS — Z515 Encounter for palliative care: Secondary | ICD-10-CM

## 2019-09-18 DIAGNOSIS — Z7189 Other specified counseling: Secondary | ICD-10-CM

## 2019-09-18 LAB — BASIC METABOLIC PANEL
Anion gap: 9 (ref 5–15)
BUN: 14 mg/dL (ref 8–23)
CO2: 21 mmol/L — ABNORMAL LOW (ref 22–32)
Calcium: 8.9 mg/dL (ref 8.9–10.3)
Chloride: 116 mmol/L — ABNORMAL HIGH (ref 98–111)
Creatinine, Ser: 0.77 mg/dL (ref 0.44–1.00)
GFR calc Af Amer: >60 mL/min (ref >60)
GFR calc non Af Amer: >60 mL/min (ref >60)
Glucose, Bld: 148 mg/dL — ABNORMAL HIGH (ref 70–99)
Potassium: 4.2 mmol/L (ref 3.5–5.1)
Sodium: 146 mmol/L — ABNORMAL HIGH (ref 135–145)

## 2019-09-18 LAB — CBC
HCT: 46 % (ref 36.0–46.0)
Hemoglobin: 14.3 g/dL (ref 12.0–15.0)
MCH: 26.7 pg (ref 26.0–34.0)
MCHC: 31.1 g/dL (ref 30.0–36.0)
MCV: 86 fL (ref 80.0–100.0)
Platelets: 380 10*3/uL (ref 150–400)
RBC: 5.35 MIL/uL — ABNORMAL HIGH (ref 3.87–5.11)
RDW: 15.6 % — ABNORMAL HIGH (ref 11.5–15.5)
WBC: 14.5 10*3/uL — ABNORMAL HIGH (ref 4.0–10.5)
nRBC: 0 % (ref 0.0–0.2)

## 2019-09-18 LAB — LIPID PANEL
Cholesterol: 188 mg/dL (ref 0–200)
HDL: 47 mg/dL (ref >40)
LDL Cholesterol: UNDETERMINED mg/dL (ref 0–99)
Total CHOL/HDL Ratio: 4 RATIO
Triglycerides: 479 mg/dL — ABNORMAL HIGH (ref <150)
VLDL: UNDETERMINED mg/dL (ref 0–40)

## 2019-09-18 LAB — SODIUM
Sodium: 144 mmol/L (ref 135–145)
Sodium: 154 mmol/L — ABNORMAL HIGH (ref 135–145)
Sodium: 154 mmol/L — ABNORMAL HIGH (ref 135–145)

## 2019-09-18 LAB — ECHOCARDIOGRAM COMPLETE
Height: 63 in
Weight: 3894.21 oz

## 2019-09-18 LAB — HEMOGLOBIN A1C
Hgb A1c MFr Bld: 6.3 % — ABNORMAL HIGH (ref 4.8–5.6)
Mean Plasma Glucose: 134.11 mg/dL

## 2019-09-18 LAB — OSMOLALITY: Osmolality: 313 mOsm/kg — ABNORMAL HIGH (ref 275–295)

## 2019-09-18 LAB — LDL CHOLESTEROL, DIRECT: Direct LDL: 95.2 mg/dL (ref 0–99)

## 2019-09-18 MED ORDER — PERFLUTREN LIPID MICROSPHERE
1.0000 mL | INTRAVENOUS | Status: AC | PRN
  Administered 2019-09-18: 2 mL via INTRAVENOUS
  Filled 2019-09-18: qty 10

## 2019-09-18 MED ORDER — SODIUM CHLORIDE 0.9 % IV SOLN
INTRAVENOUS | Status: DC
  Administered 2019-09-18: 17:00:00 via INTRAVENOUS

## 2019-09-18 MED ORDER — SODIUM CHLORIDE 3 % IV SOLN
INTRAVENOUS | Status: AC
  Administered 2019-09-19: 03:00:00 40 mL/h via INTRAVENOUS
  Filled 2019-09-18 (×2): qty 500

## 2019-09-18 MED ORDER — SODIUM CHLORIDE 3 % IV SOLN
INTRAVENOUS | Status: DC
  Administered 2019-09-18: 75 mL/h via INTRAVENOUS
  Filled 2019-09-18 (×3): qty 500

## 2019-09-18 NOTE — Evaluation (Signed)
Speech Language Pathology Evaluation Patient Details Name: [name] MRN: [id number] DOB: 01/07/1934 Today's Date: 09/18/2019 Time: [id number] SLP Time Calculation (min) (ACUTE ONLY): 8 min  Problem List:  Patient Active Problem List   Diagnosis Date Noted  . Goals of care, counseling/discussion   . Palliative care by specialist   . DNR (do not resuscitate) discussion   . ICH (intracerebral hemorrhage) ([place]) 09/17/2019  . Essential hypertension 08/17/2018  . Hospital discharge follow-up 08/03/2018  . Bronchitis, asthmatic, mild intermittent, uncomplicated 0000000  . Primary osteoarthritis of both knees 05/16/2016  . Sleep disorder 08/25/2015  . Reduced vision 03/19/2015  . Seasonal allergies 11/18/2014  . Unspecified vitamin D deficiency 05/13/2014  . Asthma, chronic 08/07/2013  . Vitamin D deficiency 04/10/2012  . Right leg weakness 11/10/2011  . Multiple falls 10/31/2011  . DEGENERATIVE DISC DISEASE, LUMBOSACRAL SPINE W/RADICULOPATHY 10/07/2010  . IGT (impaired glucose tolerance) 09/07/2010  . OTHER OSTEOPOROSIS 08/22/2008  . Hyperlipidemia 02/07/2008  . Morbid obesity ([place]) 02/07/2008  . SCIATICA 01/04/2008  . Back pain 01/04/2008   Past Medical History:  Past Medical History:  Diagnosis Date  . Asthma   . Back pain   . Depression   . Family history of arthritis   . Family history of diabetes mellitus   . Family history of ischemic heart disease   . Headache(784.0)   . High blood pressure   . High cholesterol   . Hyperlipidemia   . Hypertension   . Obesity   . Pain from implanted hardware 01/28/2012  . Personal history of unspecified circulatory disease   . Sciatica   . Shortness of breath   . Urinary incontinence    Past Surgical History:  Past Surgical History:  Procedure Laterality Date  . ABDOMINAL HYSTERECTOMY    . BACK SURGERY  2009   Dr. Ronnald Ramp [place] Nuerosurgeon  . COLONOSCOPY    . HARDWARE REMOVAL  01/28/2012   Procedure: HARDWARE  REMOVAL;  Surgeon: Johnny Bridge, MD;  Location: [facility];  Service: Orthopedics;  Laterality: Right;  Right Knee  . right knee surgery after fracture  2006  . SPINE SURGERY     HPI:  [name] is an 85 y.o. F with a history of sciatica, obesity, hypertension, hyperlipidemia and headache. Pt was with daughter and had sudden onset L side weakness, L facial droop, and slurred speech. Upon arrival L neglect was noted with no sensation retained on L side. 9/21 head CT showed large right basal ganglia hemorrhage with intraventricular and subarachnoid extension and 4 mm of right-to-left midline shift. Following 9/22 CT demonstrated significant increase in size of R basal ganglia hemorrhage, increase in intraventricular extension of blood, possible ventricular outflow obstruction, and increase in mass effect of midline shift from 56mm to 104mm. Neuro consulted. Pt placed on nasal cannula oxygen 9/21, CXR was unremarkable. She is currently requiring suction to clear secretions, diet is NPO at this time.    Assessment / Plan / Recommendation Clinical Impression  Moderate dysarthria and challenges in cognitive function describe pt's current speech/communication/cognition abilities. Vocalization noted for harsh and strained quality and varied prosody resulting in decreased intelligibility beyond the single word and 1-3 word level. She was unaware of being in the hospital and stroke diagnosis but recalled both with 10-12 minute delay. Daughter and grandson were there and pt asking about grandaughter repeatedly (who she raised). Pt's heart rate became elevated and head of bed lowered with RN present. Pt would benefit from continued therapy  to address dysarthria and cogntive deficits.        SLP Assessment  SLP Recommendation/Assessment: Patient needs continued Speech Lanaguage Pathology Services SLP Visit Diagnosis: Dysarthria and anarthria (R47.1);Cognitive communication deficit (R41.841)     Follow Up Recommendations  Skilled Nursing facility    Frequency and Duration min 2x/week  2 weeks      SLP Evaluation Cognition  Overall Cognitive Status: Impaired/Different from baseline Arousal/Alertness: Awake/alert Orientation Level: Oriented to person;Disoriented to place;Disoriented to time;Disoriented to situation Attention: Sustained Sustained Attention: Appears intact Sustained Attention Impairment: Verbal basic Memory: (TBA ) Awareness: Impaired Awareness Impairment: Intellectual impairment;Emergent impairment Problem Solving: Impaired Problem Solving Impairment: Functional basic Safety/Judgment: Impaired       Comprehension  Auditory Comprehension Overall Auditory Comprehension: Appears within functional limits for tasks assessed Visual Recognition/Discrimination Discrimination: Not tested Reading Comprehension Reading Status: (TBA)    Expression Expression Primary Mode of Expression: Verbal Verbal Expression Overall Verbal Expression: Appears within functional limits for tasks assessed Initiation: No impairment Level of Generative/Spontaneous Verbalization: Sentence Repetition: (NT) Naming: Not tested Pragmatics: Impairment Impairments: Topic maintenance Written Expression Written Expression: Not tested   Oral / Motor  Oral Motor/Sensory Function Overall Oral Motor/Sensory Function: Severe impairment Facial ROM: Reduced left;Suspected CN VII (facial) dysfunction Facial Symmetry: Abnormal symmetry left;Suspected CN VII (facial) dysfunction Facial Strength: Reduced left;Suspected CN VII (facial) dysfunction Facial Sensation: (will assess) Motor Speech Overall Motor Speech: Impaired Respiration: Impaired Level of Impairment: Sentence Phonation: Other (comment)(harsh/strained) Resonance: (will assess further) Articulation: Impaired Level of Impairment: Sentence Intelligibility: Intelligibility reduced Word: 50-74% accurate Phrase: 25-49%  accurate Motor Planning: Witnin functional limits   GO                    Houston Siren 09/18/2019, 3:23 PM [name] Norine Reddington M.Ed Risk analyst [phone] Office [phone]

## 2019-09-18 NOTE — Progress Notes (Signed)
Received consult for spiritual care. Met Nurse, she said family will be in today to talk about treatment for [name]. I attempted to connect with [name] and she was being tended to by staff. Pt was sleeping. I will follow up later in the day. Chaplain available as needed.   Chaplain resident Fidel Levy  [phone]

## 2019-09-18 NOTE — Progress Notes (Signed)
Carotid duplex has been completed.   Preliminary results in CV Proc.   Abram Sander 09/18/2019 3:28 PM

## 2019-09-18 NOTE — Progress Notes (Signed)
STROKE TEAM PROGRESS NOTE   INTERVAL HISTORY Pt RN at bedside. I also called pt daughter for worsening ICH on CT. Pt lying in bed, eyes not open but orientated with severe dysarthria. Right hemiplegia.  Talk with daughter and grandson at bedside, reviewed neuro imaging, updated them on treatment plan and answer other questions.  They requested pelvic care consult.  Vitals:   09/18/19 0630 09/18/19 0645 09/18/19 0700 09/18/19 0715  BP: 119/63 135/67 125/70 135/67  Pulse: 97 (!) 115 (!) 112 (!) 110  Resp: (!) 28 (!) 28 (!) 30 (!) 35  Temp:      TempSrc:      SpO2: 91% 95% 90% 91%  Weight:      Height:        CBC:  Recent Labs  Lab 09/17/19 1309 09/17/19 1320 09/18/19 0720  WBC 10.4  --  14.5*  NEUTROABS 5.4  --   --   HGB 14.4 15.3* 14.3  HCT 48.3* 45.0 46.0  MCV 87.7  --  86.0  PLT 363  --  [id number]    Basic Metabolic Panel:  Recent Labs  Lab 09/17/19 1309 09/17/19 1320 09/18/19 0327 09/18/19 0720  NA 142  142 142 144 146*  K 4.5 4.0  --  4.2  CL 109 108  --  116*  CO2 21*  --   --  21*  GLUCOSE 110* 107*  --  148*  BUN 11 12  --  14  CREATININE 0.78 0.60  --  0.77  CALCIUM 9.4  --   --  8.9   Lipid Panel:     Component Value Date/Time   CHOL 188 09/18/2019 0327   TRIG 479 (H) 09/18/2019 0327   HDL 47 09/18/2019 0327   CHOLHDL 4.0 09/18/2019 0327   VLDL UNABLE TO CALCULATE IF TRIGLYCERIDE OVER 400 mg/dL 09/18/2019 0327   LDLCALC UNABLE TO CALCULATE IF TRIGLYCERIDE OVER 400 mg/dL 09/18/2019 0327   LDLCALC 117 (H) 02/01/2018 1043   HgbA1c:  Lab Results  Component Value Date   HGBA1C 6.3 (H) 09/18/2019   Urine Drug Screen: No results found for: LABOPIA, COCAINSCRNUR, LABBENZ, AMPHETMU, THCU, LABBARB  Alcohol Level No results found for: ETH  IMAGING Ct Angio Head W Or Wo Contrast  Result Date: 09/17/2019 CLINICAL DATA:  Left facial droop. Follow-up intracranial hemorrhage EXAM: CT ANGIOGRAPHY HEAD TECHNIQUE: Multidetector CT imaging of the head was  performed using the standard protocol during bolus administration of intravenous contrast. Multiplanar CT image reconstructions and MIPs were obtained to evaluate the vascular anatomy. CONTRAST:  12mL OMNIPAQUE IOHEXOL 350 MG/ML SOLN COMPARISON:  Written head CT earlier same day FINDINGS: CTA HEAD Anterior circulation: Both internal carotid arteries are patent through the skull base and siphon regions. No siphon stenosis. The anterior and middle cerebral vessels are patent without proximal stenosis, aneurysm or vascular malformation. No evidence of a high flow vascular lesion in the region of the right basal ganglia hemorrhage. No sign of visible active extravasation. No evidence of hematoma enlargement since the previous study, maximal dimension 4.9 cm front to back. Posterior circulation: Both vertebral arteries are widely patent through the foramen magnum to the basilar. No basilar stenosis. Posterior circulation branch vessels are patent and normal. Venous sinuses: Patent and normal. Anatomic variants: None significant. IMPRESSION: No vascular lesion seen associated with the right basal ganglia hemorrhage. No intracranial large or medium vessel occlusion. No evidence of increased size of the right basal ganglia hemorrhage or of visible active extravasation. Electronically  Signed   By: Nelson Chimes [name]   On: 09/17/2019 13:40   Ct Head Wo Contrast  Result Date: 09/18/2019 CLINICAL DATA:  84 year old female with follow-up intracranial hemorrhage. EXAM: CT HEAD WITHOUT CONTRAST TECHNIQUE: Contiguous axial images were obtained from the base of the skull through the vertex without intravenous contrast. COMPARISON:  Head CT dated 09/17/2019. FINDINGS: Brain: Significant interval increase in the size of the right basal ganglia intraparenchymal hemorrhage now measuring approximately 7.8 x 5.3 cm in greatest axial dimensions (previously approximately 5.0 x 2.7 cm). There is extension of blood into the right lateral  ventricle, third ventricle, and the fourth ventricle. Small amount of layering blood is noted in the occipital horn of the left lateral ventricle. There is associated mass effect and compression of the right lateral ventricle and increase in the left lateral ventricular size since the prior CT indicative of a degree of occlude obstruction and hydrocephalus. There is approximately 15 mm right to left midline shift, measured at the level of foramen Monro (previously 4 mm). There is effacement of the right quadrigeminal plate cistern which may represent a degree uncal herniation. Vascular: No hyperdense vessel or unexpected calcification. Skull: Normal. Negative for fracture or focal lesion. Sinuses/Orbits: No acute finding. Other: None IMPRESSION: 1. Significant interval increase in the size of the right basal ganglia intraparenchymal hemorrhage as well as increase in the intraventricular extension of blood. There is associated mild interval dilatation of the left lateral ventricle indicative of a degree of ventricular outflow obstruction. Neurosurgery consult is advised. 2. Increase in mass effect and 15 mm right to left midline shift (previously 4 mm). These results were called by telephone at the time of interpretation on 09/18/2019 at 2:05 am to Dr. Leonel Ramsay, who verbally acknowledged these results. Electronically Signed   By: Anner Crete [name]   On: 09/18/2019 02:11   Dg Chest Port 1 View  Result Date: 09/18/2019 CLINICAL DATA:  Intra cerebral hemorrhage. EXAM: PORTABLE CHEST 1 VIEW COMPARISON:  09/17/2019 and 07/11/2018 FINDINGS: The heart size and pulmonary vascularity are normal. Interstitial markings are slightly accentuated due to a shallow inspiration. No consolidative infiltrates or effusions. No acute bone abnormality. IMPRESSION: No acute abnormalities. No change since the prior exam. Electronically Signed   By: Lorriane Shire [name]   On: 09/18/2019 08:13   Dg Chest Port 1 View  Result Date:  09/17/2019 CLINICAL DATA:  Acute onset left-sided weakness today. EXAM: PORTABLE CHEST 1 VIEW COMPARISON:  Single-view of the chest 07/11/2018 and PA and lateral chest 09/15/2015. FINDINGS: The lungs are clear. Heart size is normal. No pneumothorax or pleural fluid. Atherosclerosis is noted. IMPRESSION: No acute disease. Atherosclerosis. Electronically Signed   By: Inge Rise [name]   On: 09/17/2019 16:27   Ct Head Code Stroke Wo Contrast  Result Date: 09/17/2019 CLINICAL DATA:  Code stroke. Left facial droop. Measured on Dr. Camille Bal radiology EXAM: CT HEAD WITHOUT CONTRAST TECHNIQUE: Contiguous axial images were obtained from the base of the skull through the vertex without intravenous contrast. COMPARISON:  None. FINDINGS: Brain: An acute hyperdense hemorrhage in the right basal ganglia measures 4.1 x 2.7 x 4.7 cm. Additional scattered hemorrhage extends more medially in the basal ganglia. There is intraventricular extension. Subarachnoid blood is also noted adjacent to the right temporal tip. There is no hydrocephalus. There is mass effect with 4 mm midline shift. A remote lacunar infarct is present in the left lentiform nucleus. Moderate diffuse white matter disease is again seen. The brainstem and  cerebellum are normal. Vascular: Atherosclerotic calcifications are present within the cavernous internal carotid arteries. There is no hyperdense vessel. Skull: Calvarium is intact. No focal lytic or blastic lesions are present. Sinuses/Orbits: The paranasal sinuses and mastoid air cells are clear. Bilateral lens replacements are noted. Globes and orbits are otherwise unremarkable. IMPRESSION: 1. Large right basal ganglia hemorrhage with intraventricular and subarachnoid extension and 4 mm of right-to-left midline shift. Critical Value/emergent results were called by telephone at the time of interpretation on 09/17/2019 at 1:27 pm to providerSCOTT GOLDSTON's PA , who verbally acknowledged these results. The  above was relayed via text pager to Dr. Cheral Marker on 09/17/2019 at 13:28 . Electronically Signed   By: San Morelle [name]   On: 09/17/2019 13:33    PHYSICAL EXAM  Temp:  [97.9 F (36.6 C)-99.6 F (37.6 C)] 99.2 F (37.3 C) (09/22 0400) Pulse Rate:  [67-127] 105 (09/22 0900) Resp:  [17-37] 29 (09/22 0900) BP: (106-200)/(56-110) 127/62 (09/22 0900) SpO2:  [90 %-98 %] 96 % (09/22 0900) Weight:  [110.4 kg-111.3 kg] 110.4 kg (09/21 1742)  General - Well nourished, well developed, very lethargic.  Ophthalmologic - fundi not visualized due to noncooperation.  Cardiovascular - Regular rhythm and rate.  Neuro - very lethargic, eyes close, not open to voice or pain, however, able to answer orientated questions but in a severe dysarthic voice. Orientated to place, name, age and month. Right forced gaze, left pupil 63mm and right pupil 1.56mm, not reactive to light, positive corneal. Severe left facial droop, tongue midline. Left hemiplegia with slight toe withdraw on pain stimulation. Right UE and LE spontaneous movement against gravity. Sensation subjective symmetric. Coordination not cooperative. Gait not tested.   ASSESSMENT/PLAN [name] is a 85 y.o. female with history of sciatica, obesity, hypertension, hyperlipidemia and headache presenting with left facial droop, right eye gaze deviation, left-sided flaccidity and no sensation on the left side.   ICH: R large basal ganglia hemorrhage w/ IVH and SAH with cerebral edema and transfalcine herniation likely due to hypertensive etiology  Code Stroke CT head large R basal ganglia hemorrhage w/ IVH and SAH, 4 mm midline shift.     CTA head no vascular lesion seen. No ELVO.  Repeat CT head 9/22 significant increase in size of R basal ganglia ICH and IVH. Mild dilatation L lateral ventricle. Increased midline shift to 15 mm.  Carotid Doppler unremarkable  2D Echo EF 65 to 70%  LDL UTC d/t TG > 400. Direct LDL 95.2  HgbA1c  6.3  SCDs for VTE prophylaxis  aspirin 81 mg daily prior to admission, now on No antithrombotic given hemorrhage   Therapy recommendations:  pending   Disposition:  Pending  Discussed with daughter, she requested to speak with palliative care service.  Code status - DNR  Cerebral Edema and Transfalcine Herniation  Increase of hemorrhage size since admission on repeat CT  Started on 3% protocol via PIV  Na 146-> 154  Goal Na 150-155  Hypertensive Emergency  BP as high as 200/110  Home meds:  Cozaar 25  On Cleviprex drip . SBP goal < 140  Hyperlipidemia  Home meds:  Pravachol 10  LDL UTC as TG > 400, goal < 70. Direct LDL pending   Statin on hold given ICH  Continue statin at discharge  Pre - Diabetes  No hx diabetes  HgbA1c 6.3, at goal < 7.0  SSI  CBG monitoring  Dysphagia . Secondary to stroke . NPO . Speech on  board . On IV fluid   Other Stroke Risk Factors  Advanced age  Former Cigarette smoker, quit 25 yrs ago  Morbid Obesity, Body mass index is 43.11 kg/m., recommend weight loss, diet and exercise as appropriate   Family hx stroke (father, brother)  Other Active Problems  Leukocytosis 10.4-14.5, likely reactive.  CXR ok.    Hospital day # 1  This patient is critically ill due to large right ICH, IVH, hypertensive urgency, cerebral edema, dysphagia and at significant risk of neurological worsening, death form hematoma expansion, brain herniation, seizure. This patient's care requires constant monitoring of vital signs, hemodynamics, respiratory and cardiac monitoring, review of multiple databases, neurological assessment, discussion with family, other specialists and medical decision making of high complexity. I spent 45 minutes of neurocritical care time in the care of this patient. I had long discussion with daughter and grandson at bedside, reviewed neuro images, updated pt current condition, treatment plan and potential prognosis,  and answered all the questions.  They expressed understanding and appreciation.   Rosalin Hawking, MD PhD Stroke Neurology 09/18/2019 5:19 PM   To contact Stroke Continuity provider, please refer to [email or web address] After hours, contact General Neurology

## 2019-09-18 NOTE — Progress Notes (Signed)
OT Cancellation Note  Patient Details Name: [name] MRN: [id number] DOB: [birth year]   Cancelled Treatment:    Reason Eval/Treat Not Completed: Patient not medically ready(Per RN, hold because of respiratory status. Will return as schedule allows.)  Lequita Halt, OT Student  09/18/2019, 9:14 AM

## 2019-09-18 NOTE — Evaluation (Signed)
Clinical/Bedside Swallow Evaluation Patient Details  Name: [name] MRN: [id number] Date of Birth: 05/05/1934  Today's Date: 09/18/2019 Time: SLP Start Time (ACUTE ONLY): 1033 SLP Stop Time (ACUTE ONLY): 1041 SLP Time Calculation (min) (ACUTE ONLY): 8 min  Past Medical History:  Past Medical History:  Diagnosis Date  . Asthma   . Back pain   . Depression   . Family history of arthritis   . Family history of diabetes mellitus   . Family history of ischemic heart disease   . Headache(784.0)   . High blood pressure   . High cholesterol   . Hyperlipidemia   . Hypertension   . Obesity   . Pain from implanted hardware 01/28/2012  . Personal history of unspecified circulatory disease   . Sciatica   . Shortness of breath   . Urinary incontinence    Past Surgical History:  Past Surgical History:  Procedure Laterality Date  . ABDOMINAL HYSTERECTOMY    . BACK SURGERY  2009   Dr. Ronnald Ramp [facility]  . COLONOSCOPY    . HARDWARE REMOVAL  01/28/2012   Procedure: HARDWARE REMOVAL;  Surgeon: Johnny Bridge, MD;  Location: [facility];  Service: Orthopedics;  Laterality: Right;  Right Knee  . right knee surgery after fracture  2006  . SPINE SURGERY     HPI:  [name] is an 85 y.o. F with a history of sciatica, obesity, hypertension, hyperlipidemia and headache. Pt was with daughter and had sudden onset L side weakness, L facial droop, and slurred speech. Upon arrival L neglect was noted with no sensation retained on L side. 9/21 head CT showed large right basal ganglia hemorrhage with intraventricular and subarachnoid extension and 4 mm of right-to-left midline shift. Following 9/22 CT demonstrated significant increase in size of R basal ganglia hemorrhage, increase in intraventricular extension of blood, possible ventricular outflow obstruction, and increase in mass effect of midline shift from 48mm to 70mm. Neuro consulted. Pt placed on nasal cannula oxygen  9/21, CXR was unremarkable. She is currently requiring suction to clear secretions, diet is NPO at this time.    Assessment / Plan / Recommendation Clinical Impression  Pt exhibits high aspiration risk presently given clinical observations with po's at bedside and mild respiratory concerns. At baseline she demonstrated increased work of breathing and RR in upper 20's. Severe CN VII impairment led to poor control and labial spill with water. Immediate and delayed coughing following teaspoon sips water indicative of decreased airway protection. Pt became tachycardic to 130's, RN arrived and SLP lowered head of bed. Recommend she continue NPO status with alternative means for medication. Therapy will continue efforts working toward po's if/when safe.     SLP Visit Diagnosis: Dysphagia, unspecified (R13.10)    Aspiration Risk  Moderate aspiration risk    Diet Recommendation NPO   Medication Administration: Via alternative means    Other  Recommendations Oral Care Recommendations: Oral care QID   Follow up Recommendations Skilled Nursing facility      Frequency and Duration min 2x/week  2 weeks       Prognosis Prognosis for Safe Diet Advancement: (fair-good) Barriers to Reach Goals: Severity of deficits      Swallow Study   General HPI: [name] is an 85 y.o. F with a history of sciatica, obesity, hypertension, hyperlipidemia and headache. Pt was with daughter and had sudden onset L side weakness, L facial droop, and slurred speech. Upon arrival L neglect was noted with  no sensation retained on L side. 9/21 head CT showed large right basal ganglia hemorrhage with intraventricular and subarachnoid extension and 4 mm of right-to-left midline shift. Following 9/22 CT demonstrated significant increase in size of R basal ganglia hemorrhage, increase in intraventricular extension of blood, possible ventricular outflow obstruction, and increase in mass effect of midline shift from 57mm to  43mm. Neuro consulted. Pt placed on nasal cannula oxygen 9/21, CXR was unremarkable. She is currently requiring suction to clear secretions, diet is NPO at this time.  Type of Study: Bedside Swallow Evaluation Previous Swallow Assessment: none Diet Prior to this Study: NPO Temperature Spikes Noted: Yes Respiratory Status: Room air History of Recent Intubation: No Behavior/Cognition: Alert;Requires cueing;Distractible;Cooperative Oral Cavity Assessment: (difficult to fully view) Oral Care Completed by SLP: No Oral Cavity - Dentition: (has a partial, not present) Vision: (will determine) Self-Feeding Abilities: Needs assist Patient Positioning: Upright in bed Baseline Vocal Quality: (harsh, strained with dysarthria) Volitional Cough: Weak    Oral/Motor/Sensory Function Overall Oral Motor/Sensory Function: Severe impairment Facial ROM: Reduced left;Suspected CN VII (facial) dysfunction Facial Symmetry: Abnormal symmetry left;Suspected CN VII (facial) dysfunction Facial Strength: Reduced left;Suspected CN VII (facial) dysfunction Facial Sensation: (will assess)   Ice Chips Ice chips: Impaired Presentation: Spoon Oral Phase Impairments: Reduced lingual movement/coordination;Reduced labial seal Oral Phase Functional Implications: Prolonged oral transit   Thin Liquid Thin Liquid: Impaired Presentation: Spoon Oral Phase Impairments: Reduced labial seal;Reduced lingual movement/coordination Oral Phase Functional Implications: Left anterior spillage Pharyngeal  Phase Impairments: Cough - Immediate;Cough - Delayed    Nectar Thick Nectar Thick Liquid: Not tested   Honey Thick Honey Thick Liquid: Not tested   Puree     Solid     Solid: Not tested      Houston Siren 09/18/2019,1:11 PM  [name] [place] Risk analyst [phone] Office [phone]

## 2019-09-18 NOTE — Consult Note (Signed)
Consultation Note Date: 09/18/2019   Patient Name: [name]  DOB: [birth year]  MRN: [id number]  Age / Sex: 85 y.o., female  PCP: Billie Ruddy, MD Referring Physician: Rosalin Hawking, MD  Reason for Consultation: Establishing goals of care  HPI/Patient Profile: 85 y.o. female  with past medical history of obesity, HTN, and HLD admitted on 09/17/2019 with L facial droop, dysarthria, and L side flaccidity. CT obtained and patient found to have intracranial hemorrhage. PMT consulted for Sylvester per daughter's request.   Clinical Assessment and Goals of Care: I have reviewed medical records including EPIC notes, labs and imaging, and received report from RN - RN tells me that patient appears comfortable. She is drowsy but knows her family.   I then spoke with patient's daughter [name]  to discuss diagnosis prognosis, Harvey, EOL wishes, disposition and options.  [name] shares that she is the only child - she is making decisions with her son, the patient's grandson. [name] is a Chartered certified accountant here at the hospital.   I introduced Palliative Medicine as specialized medical care for people living with serious illness. It focuses on providing relief from the symptoms and stress of a serious illness. The goal is to improve quality of life for both the patient and the family.  [name] shares about what a good day she had with her mother prior to stroke - her mother had cooked for her which she had not done in years. They also went grocery shopping together. Per [name], the patient was doing great prior to stroke.    We discussed her current illness and what it means in the larger context of her on-going co-morbidities.  Natural disease trajectory and expectations at EOL were discussed. [name] shares her conversation with Dr. Erlinda Hong with me. She has a good understanding of the situation and understands the poor prognosis.   I attempted to elicit values and goals  of care important to the patient.  [name] tells me that their main concern is the patient not suffer and her comfort be prioritized.   We discussed her code status and [name] agrees they would not want CPR or intubation - requested code status change to DNR.  We discussed her ongoing care - [name] is okay with continuing current interventions and seeing how the patient responds. She does add that she is "hoping for a miracle". We planned to follow up daily and see how the patient does throughout hospitalization.   Questions and concerns were addressed.The family was encouraged to call with questions or concerns.   Primary Decision Maker NEXT OF KIN - daughter Paula Libra    SUMMARY OF RECOMMENDATIONS   - continue current care for now but if patient declined, shift to comfort - code status changed to DNR - main priority of family is that patient not suffer - will follow up with family again tomorrow  Code Status/Advance Care Planning:  DNR   Symptom Management:   Patient currently comfortable - will address as needed   Additional Recommendations (Limitations, Scope, Preferences):  No Tracheostomy  Prognosis:   Unable to determine  Discharge Planning: To Be Determined      Primary Diagnoses: Present on Admission: . ICH (intracerebral hemorrhage) ([place])   I have reviewed the medical record, interviewed the patient and family, and examined the patient. The following aspects are pertinent.  Past Medical History:  Diagnosis Date  . Asthma   . Back pain   . Depression   . Family history of arthritis   .  Family history of diabetes mellitus   . Family history of ischemic heart disease   . Headache(784.0)   . High blood pressure   . High cholesterol   . Hyperlipidemia   . Hypertension   . Obesity   . Pain from implanted hardware 01/28/2012  . Personal history of unspecified circulatory disease   . Sciatica   . Shortness of breath   . Urinary incontinence     Social History   Socioeconomic History  . Marital status: Widowed    Spouse name: Not on file  . Number of children: 1  . Years of education: Not on file  . Highest education level: Not on file  Occupational History  . Occupation: retired   Scientific laboratory technician  . Financial resource strain: Not on file  . Food insecurity    Worry: Not on file    Inability: Not on file  . Transportation needs    Medical: Not on file    Non-medical: Not on file  Tobacco Use  . Smoking status: Former Smoker    Packs/day: 0.50    Years: 30.00    Pack years: 15.00    Types: Cigarettes    Quit date: 12/27/1993    Years since quitting: 25.7  . Smokeless tobacco: Never Used  Substance and Sexual Activity  . Alcohol use: No    Alcohol/week: 0.0 standard drinks  . Drug use: No  . Sexual activity: Not Currently  Lifestyle  . Physical activity    Days per week: Not on file    Minutes per session: Not on file  . Stress: Not on file  Relationships  . Social Herbalist on phone: Not on file    Gets together: Not on file    Attends religious service: Not on file    Active member of club or organization: Not on file    Attends meetings of clubs or organizations: Not on file    Relationship status: Not on file  Other Topics Concern  . Not on file  Social History Narrative  . Not on file   Family History  Problem Relation Age of Onset  . Breast cancer Mother        family history   . Hypertension Mother   . Diabetes Mother   . Hypertension Father   . Stroke Father   . Hypertension Sister   . Hypertension Brother   . Hypertension Brother   . Hypertension Brother   . Diabetes Sister   . Stroke Brother   . Heart defect Other        family history   . Colon cancer Neg Hx   . Colon polyps Neg Hx   . Esophageal cancer Neg Hx    Scheduled Meds: . chlorhexidine  15 mL Mouth Rinse BID  . Chlorhexidine Gluconate Cloth  6 each Topical Daily  . mouth rinse  15 mL Mouth Rinse q12n4p  .  pantoprazole (PROTONIX) IV  40 mg Intravenous QHS  . senna-docusate  1 tablet Oral BID   Continuous Infusions: . clevidipine 12 mg/hr (09/18/19 1042)  . sodium chloride (hypertonic) 75 mL/hr at 09/18/19 0700   PRN Meds:.acetaminophen **OR** acetaminophen (TYLENOL) oral liquid 160 mg/5 mL **OR** acetaminophen Allergies  Allergen Reactions  . Amlodipine     Headache   . Oxycodone Nausea And Vomiting  . Penicillins Swelling  . Pravastatin Other (See Comments)    Gives pt headaches  . Tramadol Other (See Comments)  Pt "unsure"    Vital Signs: BP 127/62   Pulse (!) 105   Temp 99.2 F (37.3 C) (Oral)   Resp (!) 29   Ht 5\' 3"  (1.6 m)   Wt 110.4 kg   SpO2 96%   BMI 43.11 kg/m  Pain Scale: PAINAD   Pain Score: 0-No pain   SpO2: SpO2: 96 % O2 Device:SpO2: 96 % O2 Flow Rate: .O2 Flow Rate (L/min): 3 L/min  IO: Intake/output summary:   Intake/Output Summary (Last 24 hours) at 09/18/2019 1225 Last data filed at 09/18/2019 0700 Gross per 24 hour  Intake 1446.29 ml  Output 200 ml  Net 1246.29 ml    LBM: Last BM Date: (PTA) Baseline Weight: Weight: 111.3 kg Most recent weight: Weight: 110.4 kg     Palliative Assessment/Data:  PPS 10%    The above conversation was completed via telephone due to the visitor restrictions during the COVID-19 pandemic. Thorough chart review and discussion with necessary members of the care team was completed as part of assessment. All issues were discussed and addressed but no physical exam was performed.  Time Total: 50 minutes Greater than 50%  of this time was spent counseling and coordinating care related to the above assessment and plan.  Juel Burrow, DNP, AGNP-C Palliative Medicine Team [phone] Pager: [phone]

## 2019-09-18 NOTE — Progress Notes (Signed)
  Echocardiogram 2D Echocardiogram has been performed.  [name] 09/18/2019, 9:57 AM

## 2019-09-18 NOTE — Progress Notes (Signed)
PT Cancellation Note  Patient Details Name: [name] MRN: [id number] DOB: [birth year]   Cancelled Treatment:    Reason Eval/Treat Not Completed: Patient not medically ready; RN reports tenuous respiratory status.  Will attempt another day.   Reginia Naas 09/18/2019, 9:25 AM  Magda Kiel, [place] [phone] 09/18/2019

## 2019-09-19 DIAGNOSIS — G936 Cerebral edema: Secondary | ICD-10-CM

## 2019-09-19 LAB — BASIC METABOLIC PANEL
Anion gap: 6 (ref 5–15)
BUN: 9 mg/dL (ref 8–23)
CO2: 24 mmol/L (ref 22–32)
Calcium: 8.6 mg/dL — ABNORMAL LOW (ref 8.9–10.3)
Chloride: 123 mmol/L — ABNORMAL HIGH (ref 98–111)
Creatinine, Ser: 0.77 mg/dL (ref 0.44–1.00)
GFR calc Af Amer: >60 mL/min (ref >60)
GFR calc non Af Amer: >60 mL/min (ref >60)
Glucose, Bld: 146 mg/dL — ABNORMAL HIGH (ref 70–99)
Potassium: 3.8 mmol/L (ref 3.5–5.1)
Sodium: 153 mmol/L — ABNORMAL HIGH (ref 135–145)

## 2019-09-19 LAB — SODIUM
Sodium: 155 mmol/L — ABNORMAL HIGH (ref 135–145)
Sodium: 157 mmol/L — ABNORMAL HIGH (ref 135–145)
Sodium: 159 mmol/L — ABNORMAL HIGH (ref 135–145)

## 2019-09-19 LAB — CBC
HCT: 45.6 % (ref 36.0–46.0)
Hemoglobin: 14.5 g/dL (ref 12.0–15.0)
MCH: 27.7 pg (ref 26.0–34.0)
MCHC: 31.8 g/dL (ref 30.0–36.0)
MCV: 87 fL (ref 80.0–100.0)
Platelets: 312 10*3/uL (ref 150–400)
RBC: 5.24 MIL/uL — ABNORMAL HIGH (ref 3.87–5.11)
RDW: 15.9 % — ABNORMAL HIGH (ref 11.5–15.5)
WBC: 17.9 10*3/uL — ABNORMAL HIGH (ref 4.0–10.5)
nRBC: 0 % (ref 0.0–0.2)

## 2019-09-19 MED ORDER — SODIUM CHLORIDE 0.9 % IV SOLN
INTRAVENOUS | Status: DC
  Administered 2019-09-19: 800 mL via INTRAVENOUS

## 2019-09-19 MED ORDER — MORPHINE SULFATE (PF) 2 MG/ML IV SOLN
1.0000 mg | INTRAVENOUS | Status: DC | PRN
  Administered 2019-09-19: 1 mg via INTRAVENOUS
  Filled 2019-09-19: qty 1

## 2019-09-19 MED ORDER — SODIUM CHLORIDE 3 % IV SOLN
INTRAVENOUS | Status: DC
  Filled 2019-09-19 (×2): qty 500

## 2019-09-19 MED ORDER — LIP MEDEX EX OINT
TOPICAL_OINTMENT | CUTANEOUS | Status: DC | PRN
  Administered 2019-09-22: 1 via TOPICAL
  Filled 2019-09-19: qty 7

## 2019-09-19 MED ORDER — SODIUM CHLORIDE 0.9 % IV SOLN
0.5000 mg/min | INTRAVENOUS | Status: DC
  Administered 2019-09-19: 0.5 mg/min via INTRAVENOUS
  Filled 2019-09-19 (×2): qty 100

## 2019-09-19 NOTE — Progress Notes (Signed)
STROKE TEAM PROGRESS NOTE   INTERVAL HISTORY Patient still lethargic, however still orientated.  Orientated to self, age and time.  Did not pass swallow.  BP still high, on Cleviprex maximized dose.  Will put on labetalol drip. Sodium 153.  Decrease 3% infusion.  Vitals:   09/19/19 0500 09/19/19 0600 09/19/19 0700 09/19/19 0800  BP: (!) 125/56 (!) 124/55 (!) 126/48 (!) 138/54  Pulse: (!) 105 (!) 113 (!) 105 (!) 114  Resp: (!) 32 (!) 28 (!) 28 (!) 27  Temp:    98.9 F (37.2 C)  TempSrc:    Axillary  SpO2: 98% 97% 98% 98%  Weight:      Height:        CBC:  Recent Labs  Lab 09/17/19 1309  09/18/19 0720 09/19/19 0421  WBC 10.4  --  14.5* 17.9*  NEUTROABS 5.4  --   --   --   HGB 14.4   < > 14.3 14.5  HCT 48.3*   < > 46.0 45.6  MCV 87.7  --  86.0 87.0  PLT 363  --  380 312   < > = values in this interval not displayed.    Basic Metabolic Panel:  Recent Labs  Lab 09/18/19 0720  09/18/19 2208 09/19/19 0421  NA 146*   < > 154* 153*  K 4.2  --   --  3.8  CL 116*  --   --  123*  CO2 21*  --   --  24  GLUCOSE 148*  --   --  146*  BUN 14  --   --  9  CREATININE 0.77  --   --  0.77  CALCIUM 8.9  --   --  8.6*   < > = values in this interval not displayed.   Lipid Panel:     Component Value Date/Time   CHOL 188 09/18/2019 0327   TRIG 479 (H) 09/18/2019 0327   HDL 47 09/18/2019 0327   CHOLHDL 4.0 09/18/2019 0327   VLDL UNABLE TO CALCULATE IF TRIGLYCERIDE OVER 400 mg/dL 09/18/2019 0327   LDLCALC UNABLE TO CALCULATE IF TRIGLYCERIDE OVER 400 mg/dL 09/18/2019 0327   LDLCALC 117 (H) 02/01/2018 1043   HgbA1c:  Lab Results  Component Value Date   HGBA1C 6.3 (H) 09/18/2019   Urine Drug Screen: No results found for: LABOPIA, COCAINSCRNUR, LABBENZ, AMPHETMU, THCU, LABBARB  Alcohol Level No results found for: ETH  IMAGING Ct Angio Head W Or Wo Contrast  Result Date: 09/17/2019 CLINICAL DATA:  Left facial droop. Follow-up intracranial hemorrhage EXAM: CT ANGIOGRAPHY HEAD  TECHNIQUE: Multidetector CT imaging of the head was performed using the standard protocol during bolus administration of intravenous contrast. Multiplanar CT image reconstructions and MIPs were obtained to evaluate the vascular anatomy. CONTRAST:  59mL OMNIPAQUE IOHEXOL 350 MG/ML SOLN COMPARISON:  Written head CT earlier same day FINDINGS: CTA HEAD Anterior circulation: Both internal carotid arteries are patent through the skull base and siphon regions. No siphon stenosis. The anterior and middle cerebral vessels are patent without proximal stenosis, aneurysm or vascular malformation. No evidence of a high flow vascular lesion in the region of the right basal ganglia hemorrhage. No sign of visible active extravasation. No evidence of hematoma enlargement since the previous study, maximal dimension 4.9 cm front to back. Posterior circulation: Both vertebral arteries are widely patent through the foramen magnum to the basilar. No basilar stenosis. Posterior circulation branch vessels are patent and normal. Venous sinuses: Patent and normal. Anatomic  variants: None significant. IMPRESSION: No vascular lesion seen associated with the right basal ganglia hemorrhage. No intracranial large or medium vessel occlusion. No evidence of increased size of the right basal ganglia hemorrhage or of visible active extravasation. Electronically Signed   By: Nelson Chimes [name]   On: 09/17/2019 13:40   Ct Head Wo Contrast  Result Date: 09/18/2019 CLINICAL DATA:  85 year old female with follow-up intracranial hemorrhage. EXAM: CT HEAD WITHOUT CONTRAST TECHNIQUE: Contiguous axial images were obtained from the base of the skull through the vertex without intravenous contrast. COMPARISON:  Head CT dated 09/17/2019. FINDINGS: Brain: Significant interval increase in the size of the right basal ganglia intraparenchymal hemorrhage now measuring approximately 7.8 x 5.3 cm in greatest axial dimensions (previously approximately 5.0 x 2.7 cm).  There is extension of blood into the right lateral ventricle, third ventricle, and the fourth ventricle. Small amount of layering blood is noted in the occipital horn of the left lateral ventricle. There is associated mass effect and compression of the right lateral ventricle and increase in the left lateral ventricular size since the prior CT indicative of a degree of occlude obstruction and hydrocephalus. There is approximately 15 mm right to left midline shift, measured at the level of foramen Monro (previously 4 mm). There is effacement of the right quadrigeminal plate cistern which may represent a degree uncal herniation. Vascular: No hyperdense vessel or unexpected calcification. Skull: Normal. Negative for fracture or focal lesion. Sinuses/Orbits: No acute finding. Other: None IMPRESSION: 1. Significant interval increase in the size of the right basal ganglia intraparenchymal hemorrhage as well as increase in the intraventricular extension of blood. There is associated mild interval dilatation of the left lateral ventricle indicative of a degree of ventricular outflow obstruction. Neurosurgery consult is advised. 2. Increase in mass effect and 15 mm right to left midline shift (previously 4 mm). These results were called by telephone at the time of interpretation on 09/18/2019 at 2:05 am to Dr. Leonel Ramsay, who verbally acknowledged these results. Electronically Signed   By: Anner Crete [name]   On: 09/18/2019 02:11   Dg Chest Port 1 View  Result Date: 09/18/2019 CLINICAL DATA:  Intra cerebral hemorrhage. EXAM: PORTABLE CHEST 1 VIEW COMPARISON:  09/17/2019 and 07/11/2018 FINDINGS: The heart size and pulmonary vascularity are normal. Interstitial markings are slightly accentuated due to a shallow inspiration. No consolidative infiltrates or effusions. No acute bone abnormality. IMPRESSION: No acute abnormalities. No change since the prior exam. Electronically Signed   By: Lorriane Shire [name]   On:  09/18/2019 08:13   Dg Chest Port 1 View  Result Date: 09/17/2019 CLINICAL DATA:  Acute onset left-sided weakness today. EXAM: PORTABLE CHEST 1 VIEW COMPARISON:  Single-view of the chest 07/11/2018 and PA and lateral chest 09/15/2015. FINDINGS: The lungs are clear. Heart size is normal. No pneumothorax or pleural fluid. Atherosclerosis is noted. IMPRESSION: No acute disease. Atherosclerosis. Electronically Signed   By: Inge Rise [name]   On: 09/17/2019 16:27   Ct Head Code Stroke Wo Contrast  Result Date: 09/17/2019 CLINICAL DATA:  Code stroke. Left facial droop. Measured on Dr. Camille Bal radiology EXAM: CT HEAD WITHOUT CONTRAST TECHNIQUE: Contiguous axial images were obtained from the base of the skull through the vertex without intravenous contrast. COMPARISON:  None. FINDINGS: Brain: An acute hyperdense hemorrhage in the right basal ganglia measures 4.1 x 2.7 x 4.7 cm. Additional scattered hemorrhage extends more medially in the basal ganglia. There is intraventricular extension. Subarachnoid blood is also noted adjacent to  the right temporal tip. There is no hydrocephalus. There is mass effect with 4 mm midline shift. A remote lacunar infarct is present in the left lentiform nucleus. Moderate diffuse white matter disease is again seen. The brainstem and cerebellum are normal. Vascular: Atherosclerotic calcifications are present within the cavernous internal carotid arteries. There is no hyperdense vessel. Skull: Calvarium is intact. No focal lytic or blastic lesions are present. Sinuses/Orbits: The paranasal sinuses and mastoid air cells are clear. Bilateral lens replacements are noted. Globes and orbits are otherwise unremarkable. IMPRESSION: 1. Large right basal ganglia hemorrhage with intraventricular and subarachnoid extension and 4 mm of right-to-left midline shift. Critical Value/emergent results were called by telephone at the time of interpretation on 09/17/2019 at 1:27 pm to providerSCOTT  GOLDSTON's PA , who verbally acknowledged these results. The above was relayed via text pager to Dr. Cheral Marker on 09/17/2019 at 13:28 . Electronically Signed   By: San Morelle [name]   On: 09/17/2019 13:33   Vas US Carotid  Result Date: 09/19/2019 Carotid Arterial Duplex Study Indications:       CVA. Risk Factors:      Hypertension, hyperlipidemia. Comparison Study:  no prior Performing Technologist: Abram Sander RVS  Examination Guidelines: A complete evaluation includes B-mode imaging, spectral Doppler, color Doppler, and power Doppler as needed of all accessible portions of each vessel. Bilateral testing is considered an integral part of a complete examination. Limited examinations for reoccurring indications may be performed as noted.  Right Carotid Findings: +----------+--------+--------+--------+------------------+--------+           PSV cm/sEDV cm/sStenosisPlaque DescriptionComments +----------+--------+--------+--------+------------------+--------+ CCA Prox  101     12              heterogenous               +----------+--------+--------+--------+------------------+--------+ CCA Distal95      13              heterogenous               +----------+--------+--------+--------+------------------+--------+ ICA Prox  91      11      1-39%   heterogenous               +----------+--------+--------+--------+------------------+--------+ ICA Distal66      11                                         +----------+--------+--------+--------+------------------+--------+ ECA       141     2                                          +----------+--------+--------+--------+------------------+--------+ +----------+--------+-------+--------+-------------------+           PSV cm/sEDV cmsDescribeArm Pressure (mmHG) +----------+--------+-------+--------+-------------------+ Subclavian110                                         +----------+--------+-------+--------+-------------------+ +---------+--------+--+--------+--+---------+ VertebralPSV cm/s50EDV cm/s11Antegrade +---------+--------+--+--------+--+---------+  Left Carotid Findings: +----------+--------+--------+--------+------------------+--------+           PSV cm/sEDV cm/sStenosisPlaque DescriptionComments +----------+--------+--------+--------+------------------+--------+ CCA Prox  148                     heterogenous               +----------+--------+--------+--------+------------------+--------+  CCA Distal87      14              heterogenous               +----------+--------+--------+--------+------------------+--------+ ICA Prox  79      17      1-39%   heterogenous               +----------+--------+--------+--------+------------------+--------+ ICA Distal101     24                                         +----------+--------+--------+--------+------------------+--------+ ECA       119                                                +----------+--------+--------+--------+------------------+--------+ +----------+--------+--------+--------+-------------------+           PSV cm/sEDV cm/sDescribeArm Pressure (mmHG) +----------+--------+--------+--------+-------------------+ Subclavian114                                         +----------+--------+--------+--------+-------------------+ +---------+--------+--+--------+-+ VertebralPSV cm/s52EDV cm/s4 +---------+--------+--+--------+-+  Summary: Right Carotid: Velocities in the right ICA are consistent with a 1-39% stenosis. Left Carotid: Velocities in the left ICA are consistent with a 1-39% stenosis. Vertebrals: Bilateral vertebral arteries demonstrate antegrade flow. *See table(s) above for measurements and observations.  Electronically signed by Antony Contras MD on 09/19/2019 at 8:12:59 AM.    Final     PHYSICAL EXAM  Temp:  [98.7 F (37.1 C)-99.6 F (37.6  C)] 98.9 F (37.2 C) (09/23 0800) Pulse Rate:  [94-123] 114 (09/23 0800) Resp:  [22-36] 27 (09/23 0800) BP: (110-157)/(48-87) 138/54 (09/23 0800) SpO2:  [95 %-100 %] 98 % (09/23 0800)  General - obese, well developed, very lethargic.  Ophthalmologic - fundi not visualized due to noncooperation.  Cardiovascular - Regular rhythm and rate.  Neuro - very lethargic, eyes close, barely open to voice or pain, however, able to answer orientated questions in a severely dysarthic voice. Orientated to place, name, age and month. Right forced gaze, pupils 2 mm, sluggish to light, positive corneal. Severe left facial droop, tongue midline. Left hemiplegia with slight toe withdraw on pain stimulation. Right UE and LE spontaneous movement against gravity. Sensation subjective symmetric. Coordination not cooperative. Gait not tested.   ASSESSMENT/PLAN Ms. AMERE NOVITSKY is a 85 y.o. female with history of sciatica, obesity, hypertension, hyperlipidemia and headache presenting with left facial droop, right eye gaze deviation, left-sided flaccidity and no sensation on the left side.   ICH: R large basal ganglia hemorrhage w/ IVH and SAH with cerebral edema and transfalcine herniation likely due to hypertensive etiology  Code Stroke CT head large R basal ganglia hemorrhage w/ IVH and SAH, 4 mm midline shift.     CTA head no vascular lesion seen. No ELVO.  Repeat CT head 9/22 significant increase in size of R basal ganglia ICH and IVH. Mild dilatation L lateral ventricle. Increased midline shift to 15 mm.  Carotid Doppler unremarkable  2D Echo EF 65 to 70%  LDL UTC d/t TG > 400. Direct LDL 95.2  HgbA1c 6.3  SCDs for VTE prophylaxis  aspirin 81 mg daily prior to  admission, now on No antithrombotic given hemorrhage   Therapy recommendations:  pending   Disposition:  Pending  palliative care consulted, continue current care for now but transition to comfort if pt declines. Daughter with good  understanding of condition.   Code status - DNR  Cerebral Edema and Transfalcine Herniation  Increase of hemorrhage size since admission on repeat CT  Started on 3% protocol via PIV, drip now off  Na 146-154-154-153->157  On NS gtt now  Goal Na 150-155  Hypertensive Emergency  BP as high as 200/110  Home meds:  Cozaar 25  On Cleviprex drip, maximized  At the labetalol drip . SBP goal < 140  Headache  Due to large right ICH  Did not pass swallow  Tylenol PR as needed  IV morphine every 2 hours as needed as per palliative care  Hyperlipidemia  Home meds:  Pravachol 10  LDL UTC as TG > 400, goal < 70. Direct LDL 95.2  Statin on hold given ICH  Continue statin at discharge  Dysphagia . Secondary to stroke . Did not pass swallow . NPO . Speech on board . On IV fluid   Other Stroke Risk Factors  Advanced age  Former Cigarette smoker, quit 25 yrs ago  Morbid Obesity, Body mass index is 43.11 kg/m., recommend weight loss, diet and exercise as appropriate   Family hx stroke (father, brother)  Other Active Problems  Leukocytosis 10.4-14.5-17.9, likely reactive.  CXR ok.    Hospital day # 2  This patient is critically ill due to large right ICH, IVH, hypertensive urgency, cerebral edema, dysphagia and at significant risk of neurological worsening, death form hematoma expansion, brain herniation, seizure. This patient's care requires constant monitoring of vital signs, hemodynamics, respiratory and cardiac monitoring, review of multiple databases, neurological assessment, discussion with family, other specialists and medical decision making of high complexity. I spent 35 minutes of neurocritical care time in the care of this patient.   Rosalin Hawking, MD PhD Stroke Neurology 09/19/2019 10:17 AM   To contact Stroke Continuity provider, please refer to [email or web address] After hours, contact General Neurology

## 2019-09-19 NOTE — Progress Notes (Signed)
SLP Cancellation Note  Patient Details Name: [name] MRN: [id number] DOB: January 05, 1934   Cancelled treatment:        RN stated pt lethargic today, ST deferred. Will continue efforts.    Houston Siren 09/19/2019, 4:21 PM    [name] Colvin Caroli.Ed Risk analyst [phone] Office [phone]

## 2019-09-19 NOTE — Progress Notes (Signed)
PT Cancellation Note  Patient Details Name: GLINDA BURGON MRN: [id number] DOB: December 09, 1934   Cancelled Treatment:    Reason Eval/Treat Not Completed: Medical issues which prohibited therapy; BP's difficult to control.  Will attempt again another day.   Reginia Naas 09/19/2019, 3:23 PM  Magda Kiel, [place] [phone] 09/19/2019

## 2019-09-19 NOTE — Progress Notes (Signed)
Dr.Xu notified of pt's sodium level 157, orders to stop 3% and start NS at 23ml/hr.

## 2019-09-19 NOTE — Progress Notes (Signed)
Daily Progress Note   Patient Name: [name]       Date: 09/19/2019 DOB: 09-02-1934  Age: 85 y.o. MRN#: [id number] Attending Physician: Rosalin Hawking, MD Primary Care Physician: Billie Ruddy, MD Admit Date: 09/17/2019  Reason for Consultation/Follow-up: Establishing goals of care  Subjective: Spoke with RN - patient remains lethargic - confused but oriented to person and place. RN reports periods where patient appears uncomfortable - tachypnea, tachycardia. SLP reports noted to continue NPO d/t aspiration risk and challenges in cognitive function.   Length of Stay: 2  Current Medications: Scheduled Meds:  . chlorhexidine  15 mL Mouth Rinse BID  . Chlorhexidine Gluconate Cloth  6 each Topical Daily  . mouth rinse  15 mL Mouth Rinse q12n4p  . pantoprazole (PROTONIX) IV  40 mg Intravenous QHS  . senna-docusate  1 tablet Oral BID    Continuous Infusions: . sodium chloride 30 mL/hr at 09/19/19 0800  . clevidipine 18 mg/hr (09/19/19 0911)  . sodium chloride (hypertonic) 40 mL/hr at 09/19/19 0800    PRN Meds: acetaminophen **OR** acetaminophen (TYLENOL) oral liquid 160 mg/5 mL **OR** acetaminophen     Vital Signs: BP (!) 138/54 (BP Location: Right Arm)   Pulse (!) 114   Temp 98.9 F (37.2 C) (Axillary)   Resp (!) 27   Ht 5\' 3"  (1.6 m)   Wt 110.4 kg   SpO2 98%   BMI 43.11 kg/m  SpO2: SpO2: 98 % O2 Device: O2 Device: Room Air O2 Flow Rate: O2 Flow Rate (L/min): 3 L/min  Intake/output summary:   Intake/Output Summary (Last 24 hours) at 09/19/2019 1006 Last data filed at 09/19/2019 0800 Gross per 24 hour  Intake 1683.68 ml  Output 2400 ml  Net -716.32 ml   LBM: Last BM Date: 09/18/19 Baseline Weight: Weight: 111.3 kg Most recent weight: Weight: 110.4 kg       Palliative  Assessment/Data: PPS 20%    Flowsheet Rows     Most Recent Value  Intake Tab  Referral Department  Neurology  Unit at Time of Referral  ICU  Palliative Care Primary Diagnosis  Neurology  Date Notified  09/18/19  Palliative Care Type  New Palliative care  Reason for referral  Clarify Goals of Care  Date of Admission  09/17/19  Date first seen by Palliative Care  09/18/19  # of days Palliative referral response time  0 Day(s)  # of days IP prior to Palliative referral  1  Clinical Assessment  Palliative Performance Scale Score  10%  Psychosocial & Spiritual Assessment  Palliative Care Outcomes  Patient/Family meeting held?  Yes  Who was at the meeting?  daughter  [place] goals of care, Provided advance care planning, Provided psychosocial or spiritual support, Changed CPR status      Patient Active Problem List   Diagnosis Date Noted  . Goals of care, counseling/discussion   . Palliative care by specialist   . DNR (do not resuscitate) discussion   . ICH (intracerebral hemorrhage) ([place]) 09/17/2019  . Essential hypertension 08/17/2018  . Hospital discharge follow-up 08/03/2018  . Bronchitis, asthmatic, mild intermittent, uncomplicated 0000000  . Primary osteoarthritis of both knees 05/16/2016  .  Sleep disorder 08/25/2015  . Reduced vision 03/19/2015  . Seasonal allergies 11/18/2014  . Unspecified vitamin D deficiency 05/13/2014  . Asthma, chronic 08/07/2013  . Vitamin D deficiency 04/10/2012  . Right leg weakness 11/10/2011  . Multiple falls 10/31/2011  . DEGENERATIVE DISC DISEASE, LUMBOSACRAL SPINE W/RADICULOPATHY 10/07/2010  . IGT (impaired glucose tolerance) 09/07/2010  . OTHER OSTEOPOROSIS 08/22/2008  . Hyperlipidemia 02/07/2008  . Morbid obesity ([place]) 02/07/2008  . SCIATICA 01/04/2008  . Back pain 01/04/2008    Palliative Care Assessment & Plan   HPI: 85 y.o. female  with past medical history of obesity, HTN, and HLD admitted on  09/17/2019 with L facial droop, dysarthria, and L side flaccidity. CT obtained and patient found to have intracranial hemorrhage. PMT consulted for [place] per daughter's request.   Assessment: Follow up with patient's daughter [name] (family calls her Nehemiah Settle) - Provided an update - [name] plans to come visit this afternoon.   We discussed patient's tachypnea and periods of discomfort - lavinia's ultimate goal remains that her mom be comfortable - she was okay with starting low dose morphine PRN - discussed it could add to lethargy but will utilize very small doses - she was agreeable to this. I have asked nurse to let me know if dose is ineffective.   [name] tells me she continues to take it "one day at a time" and feels unable to make "big decisions" right now - however, she does ask about what's next and mentions comfort care. We discussed that if her mom declined or did not improve that it would be appropriate to transition to comfort care and we discussed the possibility of a hospice facility. She is agreeable to this in the future - but again she would like to continue to take it day to day and see how her mother does.   She has my contact info and agreed to call me later today with any questions/concerns. We planned to follow up again tomorrow.   Recommendations/Plan:  Added low dose PRN morphine for tachypnea/dyspnea/discomfort - may need to adjust - will see how she responds  Continue current care but have discussed what shifting to comfort care/hospice would entail - daughter seems to be planning for this  Will f/u with daughter again tomorrow if not called before then   Goals of Care and Additional Recommendations:  Limitations on Scope of Treatment: No Tracheostomy  Code Status:  DNR  Prognosis:   Unable to determine  Discharge Planning:  To Be Determined  Care plan was discussed with RN and patient's daughter  Thank you for allowing the Palliative Medicine Team to  assist in the care of this patient.   Total Time 25 minutes Prolonged Time Billed  no    The above conversation was completed via telephone due to the visitor restrictions during the COVID-19 pandemic. Thorough chart review and discussion with necessary members of the care team was completed as part of assessment. All issues were discussed and addressed but no physical exam was performed.    Greater than 50%  of this time was spent counseling and coordinating care related to the above assessment and plan.  Juel Burrow, DNP, [facility] Palliative Medicine Team Team Phone # [phone]  Pager [phone]

## 2019-09-19 NOTE — Progress Notes (Signed)
Pt complains 10/10 headache, Dr.Xu notified. Orders to give rectal tylenol. Dr.Xu also aware that pt will be approaching hr 24 of 3% gtt, okay to continue to use peripheral IV.

## 2019-09-19 NOTE — Progress Notes (Signed)
OT Cancellation Note  Patient Details Name: [name] MRN: [id number] DOB: 1934/06/06   Cancelled Treatment:    Reason Eval/Treat Not Completed: Patient not medically ready(Per RN, hold due to BP. Systolic currently over [id number]. Will return as schedule allows)  Lequita Halt, OT Student  09/19/2019, 3:15 PM

## 2019-09-20 ENCOUNTER — Inpatient Hospital Stay (HOSPITAL_COMMUNITY): Payer: Medicare Other

## 2019-09-20 LAB — CBC
HCT: 47.7 % — ABNORMAL HIGH (ref 36.0–46.0)
Hemoglobin: 14.1 g/dL (ref 12.0–15.0)
MCH: 26.3 pg (ref 26.0–34.0)
MCHC: 29.6 g/dL — ABNORMAL LOW (ref 30.0–36.0)
MCV: 88.8 fL (ref 80.0–100.0)
Platelets: 162 10*3/uL (ref 150–400)
RBC: 5.37 MIL/uL — ABNORMAL HIGH (ref 3.87–5.11)
RDW: 16.3 % — ABNORMAL HIGH (ref 11.5–15.5)
WBC: 14.3 10*3/uL — ABNORMAL HIGH (ref 4.0–10.5)
nRBC: 0 % (ref 0.0–0.2)

## 2019-09-20 LAB — BASIC METABOLIC PANEL
Anion gap: 12 (ref 5–15)
BUN: 16 mg/dL (ref 8–23)
CO2: 20 mmol/L — ABNORMAL LOW (ref 22–32)
Calcium: 8.6 mg/dL — ABNORMAL LOW (ref 8.9–10.3)
Chloride: 128 mmol/L — ABNORMAL HIGH (ref 98–111)
Creatinine, Ser: 0.79 mg/dL (ref 0.44–1.00)
GFR calc Af Amer: >60 mL/min (ref >60)
GFR calc non Af Amer: >60 mL/min (ref >60)
Glucose, Bld: 118 mg/dL — ABNORMAL HIGH (ref 70–99)
Potassium: 4 mmol/L (ref 3.5–5.1)
Sodium: 160 mmol/L — ABNORMAL HIGH (ref 135–145)

## 2019-09-20 LAB — TRIGLYCERIDES: Triglycerides: 179 mg/dL — ABNORMAL HIGH (ref <150)

## 2019-09-20 LAB — SODIUM
Sodium: 159 mmol/L — ABNORMAL HIGH (ref 135–145)
Sodium: 156 mmol/L — ABNORMAL HIGH (ref 135–145)
Sodium: 154 mmol/L — ABNORMAL HIGH (ref 135–145)

## 2019-09-20 MED ORDER — LABETALOL HCL 5 MG/ML IV SOLN
5.0000 mg | INTRAVENOUS | Status: DC | PRN
  Administered 2019-09-21: 12:00:00 10 mg via INTRAVENOUS
  Administered 2019-09-22 – 2019-09-24 (×4): 20 mg via INTRAVENOUS
  Administered 2019-09-25 (×2): 5 mg via INTRAVENOUS
  Filled 2019-09-20 (×7): qty 4

## 2019-09-20 MED ORDER — HYDRALAZINE HCL 20 MG/ML IJ SOLN
5.0000 mg | INTRAMUSCULAR | Status: DC | PRN
  Administered 2019-09-21 – 2019-09-25 (×3): 10 mg via INTRAVENOUS
  Filled 2019-09-20 (×3): qty 1

## 2019-09-20 MED ORDER — SODIUM CHLORIDE 0.45 % IV SOLN
INTRAVENOUS | Status: DC
  Administered 2019-09-20 – 2019-09-21 (×3): via INTRAVENOUS

## 2019-09-20 NOTE — Progress Notes (Signed)
  Speech Language Pathology Treatment: Dysphagia  Patient Details Name: [name] MRN: [id number] DOB: 1934-05-27 Today's Date: 09/20/2019 Time: [id number] SLP Time Calculation (min) (ACUTE ONLY): 17 min  Assessment / Plan / Recommendation Clinical Impression  Therapist notified by PT/OT re: pt's improvements alertness, awareness and participation. Repositioned to upright position and pt following directions, likely hallucinating (seeing children in the corner). Assist to initiate teeth brushing with OT then able to perform motor sequence with occasional verbal/tactile assist. Inadequate laryngeal protection is highly suspected. Prolonged apneic period with cup sips thin eventually resulting in cough. Prolonged oral manipulation with ice chip and suspect decreased cohesion. Recommended to RN if pt alert she may have several ice chips with full supervision after oral care. It pt continues to improve and family desires to have objective information [id number] texture/liquid, could perform MBS. If daughter does not wish for procedures, etc and accepts aspiration risk, then diet could be ordered. Will depend on pt's progress and desired treatment plan.    HPI HPI: Annelies Helgerson is an 85 y.o. F with a history of sciatica, obesity, hypertension, hyperlipidemia and headache. Pt was with daughter and had sudden onset L side weakness, L facial droop, and slurred speech. Upon arrival L neglect was noted with no sensation retained on L side. 9/21 head CT showed large right basal ganglia hemorrhage with intraventricular and subarachnoid extension and 4 mm of right-to-left midline shift. Following 9/22 CT demonstrated significant increase in size of R basal ganglia hemorrhage, increase in intraventricular extension of blood, possible ventricular outflow obstruction, and increase in mass effect of midline shift from 81mm to 60mm. Neuro consulted. Pt placed on nasal cannula oxygen 9/21, CXR was unremarkable. She is  currently requiring suction to clear secretions, diet is NPO at this time.       SLP Plan          Recommendations  Diet recommendations: Other(comment)(ice chips after oral care) Medication Administration: Via alternative means                Oral Care Recommendations: Oral care QID Follow up Recommendations: Skilled Nursing facility SLP Visit Diagnosis: Dysphagia, unspecified (R13.10)                      Houston Siren 09/20/2019, 12:53 PM    [name] Colvin Caroli.Ed Risk analyst [phone] Office [phone]

## 2019-09-20 NOTE — Progress Notes (Signed)
Rehab Admissions Coordinator Note:  Patient was screened by Michel Santee for appropriateness for an Inpatient Acute Rehab Consult.  At this time, we are recommending Inpatient Rehab consult.  Michel Santee 09/20/2019, 12:16 PM  I can be reached at [phone].

## 2019-09-20 NOTE — Evaluation (Signed)
Occupational Therapy Evaluation Patient Details Name: [name] MRN: [id number] DOB: 1934-09-28 Today's Date: 09/20/2019    History of Present Illness Pt is an 85 yo female admitted with L facial droop, dysarthria, and L side flaccid. Pt found to have large R ICH.     Clinical Impression   Pt admitted with the above diagnosis and has the deficits listed below. Pt would benefit from cont OT to increase ability to complete simple adls with more independence and to increase ability to complete adl transfers.  Pt is fairly dependent with most adls at this time but is following commands, oriented and participating well.  Unsure of family situation.  If family has a supportive family and 24 hour supervision at home, she might be a candidate for inpt rehab to maximize her independence before returning home.  If this is not available, pt will need SNF.     Follow Up Recommendations  CIR;Supervision/Assistance - 24 hour    Equipment Recommendations  Other (comment)(tbd)    Recommendations for Other Services Rehab consult     Precautions / Restrictions Precautions Precautions: Fall Precaution Comments: SBP goal <140 Restrictions Weight Bearing Restrictions: No      Mobility Bed Mobility Overal bed mobility: Needs Assistance Bed Mobility: Rolling Rolling: Max assist         General bed mobility comments: rolled to R with total assist and L with max assist.  Transfers Overall transfer level: Needs assistance               General transfer comment: Rec lift at this time for transfers.    Balance Overall balance assessment: Needs assistance     Sitting balance - Comments: Did not come to full sitting due to pts current condition.       Standing balance comment: did not come to standing duringt this eval.                           ADL either performed or assessed with clinical judgement   ADL Overall ADL's : Needs assistance/impaired Eating/Feeding:  Maximal assistance;Sitting Eating/Feeding Details (indicate cue type and reason): Pt attempted drinking from a cup with ST present.   Grooming: Oral care;Moderate assistance;Bed level Grooming Details (indicate cue type and reason): Pt brushed teeth with set up and hand over hand to initiate task.  Pt with some questionable apraxia not initially opening mouth to brush but once normal movement patten established pt required less assist.   Upper Body Bathing: Total assistance;Bed level   Lower Body Bathing: Total assistance;+2 for physical assistance;Bed level   Upper Body Dressing : Total assistance;Bed level   Lower Body Dressing: Total assistance;+2 for physical assistance;Bed level       Toileting- Clothing Manipulation and Hygiene: Total assistance;Bed level       Functional mobility during ADLs: Total assistance General ADL Comments: Pt participated well in grooming tasks sitting up in bed. Pt eyes swollen shut throughout eval.  Pt dependent with all other adls with no active movement in the LLE and LUE.       Vision Baseline Vision/History: Glaucoma Patient Visual Report: Other (comment)(eyes closed throughout eval) Vision Assessment?: Vision impaired- to be further tested in functional context Additional Comments: Pt's eyes closed during entire evaluation.  When manually opened by therapist, pt could identify colors and number of fingers held up by therapist.     Perception Perception Perception Tested?: No   Praxis Praxis Praxis tested?:  Deficits Deficits: Ideomotor Praxis-Other Comments: May or may not have apraxia.  Did have difficulty coordinating movement to open mouth for spoon and for toothbrush.  Once movement pattern establised, pt did better.    Pertinent Vitals/Pain Pain Assessment: Faces Faces Pain Scale: Hurts little more Pain Location: R upper leg Pain Descriptors / Indicators: Aching Pain Intervention(s): Monitored during session;Repositioned     Hand  Dominance Right   Extremity/Trunk Assessment Upper Extremity Assessment Upper Extremity Assessment: LUE deficits/detail LUE Deficits / Details: PROM wfl.  No AROM LUE Sensation: decreased light touch LUE Coordination: decreased fine motor;decreased gross motor   Lower Extremity Assessment Lower Extremity Assessment: Defer to PT evaluation   Cervical / Trunk Assessment Cervical / Trunk Assessment: Normal   Communication Communication Communication: Other (comment)(dysarthric)   Cognition Arousal/Alertness: Awake/alert Behavior During Therapy: WFL for tasks assessed/performed Overall Cognitive Status: Impaired/Different from baseline Area of Impairment: Memory;Problem solving(possibly hallucinating seeing small children in room)                     Memory: Decreased short-term memory       Problem Solving: Slow processing General Comments: Pt overall following all simple commands and verbalizing answers to questions correctly.    General Comments  Pt overall max assist to dependent with most adls but is very motivated, following commands and oriented.  Feel, if and only if, pt has family, she would benefit from inpt rehab.    Exercises     Shoulder Instructions      Home Living Family/patient expects to be discharged to:: Inpatient rehab                                 Additional Comments: If pt has supportive family, inpt rehab may be an option.      Prior Functioning/Environment Level of Independence: Independent                 OT Problem List: Decreased strength;Decreased range of motion;Decreased activity tolerance;Impaired balance (sitting and/or standing);Impaired vision/perception;Decreased coordination;Decreased cognition;Decreased safety awareness;Decreased knowledge of use of DME or AE;Decreased knowledge of precautions;Impaired sensation;Impaired tone;Obesity;Impaired UE functional use;Pain      OT Treatment/Interventions:  Self-care/ADL training;Neuromuscular education;Therapeutic activities;DME and/or AE instruction;Visual/perceptual remediation/compensation    OT Goals(Current goals can be found in the care plan section) Acute Rehab OT Goals Patient Stated Goal: to get better OT Goal Formulation: With patient Time For Goal Achievement: 10/04/19 Potential to Achieve Goals: Fair ADL Goals Pt Will Perform Grooming: with min assist;sitting Pt Will Perform Upper Body Bathing: with min assist;sitting Additional ADL Goal #1: Pt will sit on EOB for simple adl task for 5 minutes with min assist for balance in prep for more adls on EOB. Additional ADL Goal #2: Pt will transfer to [facility] with max assist x1 Additional ADL Goal #3: Pt will open eyes and locate adl items in L enviroment with min cues to increase awareness of L side.  OT Frequency: Min 2X/week   Barriers to D/C: Decreased caregiver support  Pt lives alone but does have family close by       Co-evaluation PT/OT/SLP Co-Evaluation/Treatment: Yes   PT goals addressed during session: Mobility/safety with mobility OT goals addressed during session: ADL's and self-care SLP goals addressed during session: Swallowing    AM-PAC OT "6 Clicks" Daily Activity     Outcome Measure Help from another person eating meals?: Total Help from another person  taking care of personal grooming?: Total Help from another person toileting, which includes using toliet, bedpan, or urinal?: Total Help from another person bathing (including washing, rinsing, drying)?: Total Help from another person to put on and taking off regular upper body clothing?: Total Help from another person to put on and taking off regular lower body clothing?: Total 6 Click Score: 6   End of Session Nurse Communication: Mobility status;Other (comment)(need for lift to get OOB)  Activity Tolerance: Patient limited by fatigue Patient left: in bed;with call bell/phone within reach;with bed alarm  set  OT Visit Diagnosis: Other symptoms and signs involving the nervous system (R29.898);Other symptoms and signs involving cognitive function;Hemiplegia and hemiparesis;Pain;Muscle weakness (generalized) (M62.81);Other abnormalities of gait and mobility (R26.89) Hemiplegia - Right/Left: Left Hemiplegia - dominant/non-dominant: Non-Dominant Hemiplegia - caused by: Nontraumatic intracerebral hemorrhage Pain - Right/Left: Right Pain - part of body: Leg                Time: [id number] OT Time Calculation (min): 36 min Charges:  OT General Charges $OT Visit: 1 Visit OT Evaluation $OT Eval Moderate Complexity: 1 Mod  Glenford Peers 09/20/2019, 11:58 AM

## 2019-09-20 NOTE — Evaluation (Signed)
Physical Therapy Evaluation Patient Details Name: [name] MRN: [id number] DOB: [birth year] Today's Date: 09/20/2019   History of Present Illness  Pt is an 85 yo female admitted with L facial droop, dysarthria, and L side flaccid. Pt found to have R basal ganglia hemorrhage with IVH and SAH with cerebral edema and transfalcine herniation likely due to hypertensive urgency inital 52mm midline shift.  Pt with significant PMH of obesity, HTN, back pain s/p spine surgery, R knee surgery after fx.    Clinical Impression  Pt tolerated upright posture with mildly elevated BP ([id number] sytolically initially RN made aware that was outside of set parameters).  She was able to engage in conversation, follow commands, but did have moments of confusion thinking there were children in the room.  She has dense left hemiplegia with no sensation to LT or deeper pressure on her left side.  She knows she is in the hospital and has had a stroke, but could not report her left sided weakness.  She will need multidisciplinary post acute rehab.   PT to follow acutely for deficits listed below.    Follow Up Recommendations CIR    Equipment Recommendations  Wheelchair (measurements PT);Wheelchair cushion (measurements PT);Hospital bed;Other (comment)(hoyer lift)    Recommendations for Other Services Rehab consult     Precautions / Restrictions Precautions Precautions: Fall Precaution Comments: SBP goal <140      Mobility  Bed Mobility Overal bed mobility: Needs Assistance Bed Mobility: Rolling Rolling: Max assist;+2 for physical assistance         General bed mobility comments: rolled to R with total assist and L with max assist of two person to help with pericare and positioning.   Transfers                 General transfer comment: recommend maxi move lift at this time.  Significant time spent with pericare and SLP co session, so did not lift today and pt is headed for repeat CT.    Ambulation/Gait             General Gait Details: unable at this time.    Modified Rankin (Stroke Patients Only) Modified Rankin (Stroke Patients Only) Pre-Morbid Rankin Score: No symptoms Modified Rankin: Severe disability           Pertinent Vitals/Pain Pain Assessment: Faces Faces Pain Scale: Hurts little more Pain Location: R upper leg Pain Descriptors / Indicators: Aching Pain Intervention(s): Limited activity within patient's tolerance;Monitored during session;Repositioned    Home Living Family/patient expects to be discharged to:: Private residence                 Additional Comments: Not sure of home situation, per RN daughter is a NT on 3W    Prior Function Level of Independence: Independent               Hand Dominance   Dominant Hand: Right    Extremity/Trunk Assessment   Upper Extremity Assessment Upper Extremity Assessment: Defer to OT evaluation    Lower Extremity Assessment Lower Extremity Assessment: LLE deficits/detail;RLE deficits/detail RLE Deficits / Details: right leg moving all joints, reports thigh pain, but no obvious bruises, mildly tender to palpation.  Pt able to wiggle ankle, lift leg against gravity (hip flexion at least 3/5) and bend knee at bed level ~3/5 knee.  Sensation intact.  RLE Sensation: WNL RLE Coordination: WNL LLE Deficits / Details: left leg appears flaccid LLE Sensation: decreased light touch    Cervical /  Trunk Assessment Cervical / Trunk Assessment: Other exceptions Cervical / Trunk Exceptions: chart reports back surgery and pain history.   Communication   Communication: Other (comment)(dysarthric)  Cognition Arousal/Alertness: Awake/alert Behavior During Therapy: WFL for tasks assessed/performed Overall Cognitive Status: Impaired/Different from baseline Area of Impairment: Memory;Problem solving                     Memory: Decreased short-term memory       Problem Solving: Slow  processing General Comments: Pt overall following all simple commands and verbalizing answers to questions correctly, however, at one point during the session she started to talk about "all the children over there" which is both inaccurate and interesting as her eyes were shut nearly the entire session.             Assessment/Plan    PT Assessment Patient needs continued PT services  PT Problem List Decreased strength;Decreased range of motion;Decreased activity tolerance;Decreased balance;Decreased mobility;Decreased cognition;Decreased coordination;Decreased knowledge of use of DME;Decreased safety awareness;Decreased knowledge of precautions;Cardiopulmonary status limiting activity;Obesity;Impaired sensation;Impaired tone;Pain       PT Treatment Interventions DME instruction;Gait training;Stair training;Functional mobility training;Therapeutic activities;Therapeutic exercise;Balance training;Neuromuscular re-education;Cognitive remediation;Patient/family education;Wheelchair mobility training;Modalities    PT Goals (Current goals can be found in the Care Plan section)  Acute Rehab PT Goals Patient Stated Goal: to get better PT Goal Formulation: With patient Time For Goal Achievement: 10/04/19 Potential to Achieve Goals: Good    Frequency Min 4X/week        Co-evaluation PT/OT/SLP Co-Evaluation/Treatment: Yes Reason for Co-Treatment: Complexity of the patient's impairments (multi-system involvement);Necessary to address cognition/behavior during functional activity;For patient/therapist safety;To address functional/ADL transfers PT goals addressed during session: Mobility/safety with mobility;Strengthening/ROM         AM-PAC PT "6 Clicks" Mobility  Outcome Measure Help needed turning from your back to your side while in a flat bed without using bedrails?: Total Help needed moving from lying on your back to sitting on the side of a flat bed without using bedrails?:  Total Help needed moving to and from a bed to a chair (including a wheelchair)?: Total Help needed standing up from a chair using your arms (e.g., wheelchair or bedside chair)?: Total Help needed to walk in hospital room?: Total Help needed climbing 3-5 steps with a railing? : Total 6 Click Score: 6    End of Session Equipment Utilized During Treatment: Oxygen Activity Tolerance: Patient tolerated treatment well Patient left: in bed;with call bell/phone within reach;with bed alarm set Nurse Communication: Mobility status PT Visit Diagnosis: Muscle weakness (generalized) (M62.81);Difficulty in walking, not elsewhere classified (R26.2);Hemiplegia and hemiparesis Hemiplegia - Right/Left: Left Hemiplegia - dominant/non-dominant: Non-dominant Hemiplegia - caused by: Nontraumatic intracerebral hemorrhage    Time: [id number] PT Time Calculation (min) (ACUTE ONLY): 35 min   Charges:      Wells Guiles B. Reid Nawrot, PT, DPT  Acute Rehabilitation [phone] pager [phone]) [phone] office  @ [facility]: [phone]   PT Evaluation $PT Eval Moderate Complexity: 1 Mod        09/20/2019, 5:30 PM

## 2019-09-20 NOTE — Progress Notes (Signed)
Daily Progress Note   Patient Name: [name]       Date: 09/20/2019 DOB: 11/06/1934  Age: 85 y.o. MRN#: [id number] Attending Physician: Kendra Hawking, MD Primary Care Physician: Kendra Ruddy, MD Admit Date: 09/17/2019  Reason for Consultation/Follow-up: Establishing goals of care  Subjective: Spoke with RN - patient continues to keep eyes closed but is oriented. Off IV drips. Now working with therapy and is appropriate. Plans for repeat CT today. Dysphagia continues.   Length of Stay: 3  Current Medications: Scheduled Meds:  . chlorhexidine  15 mL Mouth Rinse BID  . Chlorhexidine Gluconate Cloth  6 each Topical Daily  . mouth rinse  15 mL Mouth Rinse q12n4p  . pantoprazole (PROTONIX) IV  40 mg Intravenous QHS  . senna-docusate  1 tablet Oral BID    Continuous Infusions: . sodium chloride    . clevidipine Stopped (09/19/19 1933)    PRN Meds: acetaminophen **OR** acetaminophen (TYLENOL) oral liquid 160 mg/5 mL **OR** acetaminophen, hydrALAZINE, labetalol, lip balm, morphine injection     Vital Signs: BP (!) 120/57   Pulse (!) 58   Temp 98.5 F (36.9 C) (Axillary)   Resp 18   Ht 5\' 3"  (1.6 m)   Wt 110.4 kg   SpO2 100%   BMI 43.11 kg/m  SpO2: SpO2: 100 % O2 Device: O2 Device: Room Air O2 Flow Rate: O2 Flow Rate (L/min): 3 L/min  Intake/output summary:   Intake/Output Summary (Last 24 hours) at 09/20/2019 1108 Last data filed at 09/20/2019 0800 Gross per 24 hour  Intake 1303.01 ml  Output 600 ml  Net 703.01 ml   LBM: Last BM Date: 09/20/19 Baseline Weight: Weight: 111.3 kg Most recent weight: Weight: 110.4 kg       Palliative Assessment/Data: PPS 20%    Flowsheet Rows     Most Recent Value  Intake Tab  Referral Department  Neurology  Unit at Time of Referral  ICU   Palliative Care Primary Diagnosis  Neurology  Date Notified  09/18/19  Palliative Care Type  New Palliative care  Reason for referral  Clarify Goals of Care  Date of Admission  09/17/19  Date first seen by Palliative Care  09/18/19  # of days Palliative referral response time  0 Day(s)  # of days IP prior to Palliative referral  1  Clinical Assessment  Palliative Performance Scale Score  10%  Psychosocial & Spiritual Assessment  Palliative Care Outcomes  Patient/Family meeting held?  Yes  Who was at the meeting?  daughter  [place] goals of care, Provided advance care planning, Provided psychosocial or spiritual support, Changed CPR status      Patient Active Problem List   Diagnosis Date Noted  . Goals of care, counseling/discussion   . Palliative care by specialist   . DNR (do not resuscitate) discussion   . ICH (intracerebral hemorrhage) ([place]) 09/17/2019  . Essential hypertension 08/17/2018  . Hospital discharge follow-up 08/03/2018  . Bronchitis, asthmatic, mild intermittent, uncomplicated 0000000  . Primary osteoarthritis of both knees 05/16/2016  . Sleep disorder 08/25/2015  . Reduced vision 03/19/2015  . Seasonal allergies 11/18/2014  . Unspecified vitamin D deficiency 05/13/2014  .  Asthma, chronic 08/07/2013  . Vitamin D deficiency 04/10/2012  . Right leg weakness 11/10/2011  . Multiple falls 10/31/2011  . DEGENERATIVE DISC DISEASE, LUMBOSACRAL SPINE W/RADICULOPATHY 10/07/2010  . IGT (impaired glucose tolerance) 09/07/2010  . OTHER OSTEOPOROSIS 08/22/2008  . Hyperlipidemia 02/07/2008  . Morbid obesity ([place]) 02/07/2008  . SCIATICA 01/04/2008  . Back pain 01/04/2008    Palliative Care Assessment & Plan   HPI: 85 y.o. female  with past medical history of obesity, HTN, and HLD admitted on 09/17/2019 with L facial droop, dysarthria, and L side flaccidity. CT obtained and patient found to have intracranial hemorrhage. PMT consulted for  [place] per daughter's request.   Assessment: Follow up with patient's daughter [name] - provided update from RN. [name] continues to be hopeful for improvement but also realistic and understands the severity of the situation.  We discuss concerns about dysphagia - [name] shares she would be okay with a temporary feeding tube if deemed appropriate. However, if situation warranted a PEG tube she does not think her mom would want to live with this and would elect a comfort approach.   Continues to ask that we continue current measures as patient is stable - if patient declines she would agree to comfort care.   Recommendations/Plan:  Daughter updated - agreeable to cortrak for short term feeding - does not think she would want PEG if swallow does not improve  Added low dose PRN morphine for tachypnea/dyspnea/discomfort - may need to adjust - will see how she responds  Continue current care but have discussed what shifting to comfort care/hospice would entail - daughter seems to be planning for this  I am off tomorrow but will ask my partners to continue to follow  Goals of Care and Additional Recommendations:  Limitations on Scope of Treatment: No Tracheostomy  Code Status:  DNR  Prognosis:   Unable to determine  Discharge Planning:  To Be Determined  Care plan was discussed with RN and patient's daughter  Thank you for allowing the Palliative Medicine Team to assist in the care of this patient.   Total Time 25 minutes Prolonged Time Billed  no    The above conversation was completed via telephone due to the visitor restrictions during the COVID-19 pandemic. Thorough chart review and discussion with necessary members of the care team was completed as part of assessment. All issues were discussed and addressed but no physical exam was performed.    Greater than 50%  of this time was spent counseling and coordinating care related to the above assessment and plan.  Kendra Burrow, DNP, [facility] Palliative Medicine Team Team Phone # [phone]  Pager [phone]

## 2019-09-20 NOTE — Progress Notes (Signed)
STROKE TEAM PROGRESS NOTE   INTERVAL HISTORY Pt lying in bed. No family at bedside. Pt condition stable, no significant change from yesterday. Lethargic with eye closed but fully orientated, severe dysarthria. BP much better, off IV drip. Na 160, will change to 1/2NS.     Vitals:   09/20/19 0500 09/20/19 0600 09/20/19 0700 09/20/19 0800  BP: 134/73 138/68 140/68 118/64  Pulse: 79 83 81 60  Resp: 20 (!) 22 19 (!) 22  Temp:   98.5 F (36.9 C)   TempSrc:   Axillary   SpO2: 98% 97% 99% 100%  Weight:      Height:        CBC:  Recent Labs  Lab 09/17/19 1309  09/19/19 0421 09/20/19 0610  WBC 10.4   < > 17.9* 14.3*  NEUTROABS 5.4  --   --   --   HGB 14.4   < > 14.5 14.1  HCT 48.3*   < > 45.6 47.7*  MCV 87.7   < > 87.0 88.8  PLT 363   < > 312 162   < > = values in this interval not displayed.    Basic Metabolic Panel:  Recent Labs  Lab 09/19/19 0421  09/19/19 2157 09/20/19 0610  NA 153*   < > 159* 160*  K 3.8  --   --  4.0  CL 123*  --   --  128*  CO2 24  --   --  20*  GLUCOSE 146*  --   --  118*  BUN 9  --   --  16  CREATININE 0.77  --   --  0.79  CALCIUM 8.6*  --   --  8.6*   < > = values in this interval not displayed.   Lipid Panel:     Component Value Date/Time   CHOL 188 09/18/2019 0327   TRIG 179 (H) 09/20/2019 0610   HDL 47 09/18/2019 0327   CHOLHDL 4.0 09/18/2019 0327   VLDL UNABLE TO CALCULATE IF TRIGLYCERIDE OVER 400 mg/dL 09/18/2019 0327   LDLCALC UNABLE TO CALCULATE IF TRIGLYCERIDE OVER 400 mg/dL 09/18/2019 0327   LDLCALC 117 (H) 02/01/2018 1043   HgbA1c:  Lab Results  Component Value Date   HGBA1C 6.3 (H) 09/18/2019   Urine Drug Screen: No results found for: LABOPIA, COCAINSCRNUR, LABBENZ, AMPHETMU, THCU, LABBARB  Alcohol Level No results found for: [facility]  IMAGING Vas US Carotid  Result Date: 09/19/2019 Carotid Arterial Duplex Study Indications:       CVA. Risk Factors:      Hypertension, hyperlipidemia. Comparison Study:  no prior Performing  Technologist: Abram Sander RVS  Examination Guidelines: A complete evaluation includes B-mode imaging, spectral Doppler, color Doppler, and power Doppler as needed of all accessible portions of each vessel. Bilateral testing is considered an integral part of a complete examination. Limited examinations for reoccurring indications may be performed as noted.  Right Carotid Findings: +----------+--------+--------+--------+------------------+--------+           PSV cm/sEDV cm/sStenosisPlaque DescriptionComments +----------+--------+--------+--------+------------------+--------+ CCA Prox  101     12              heterogenous               +----------+--------+--------+--------+------------------+--------+ CCA Distal95      13              heterogenous               +----------+--------+--------+--------+------------------+--------+ ICA Prox  91  11      1-39%   heterogenous               +----------+--------+--------+--------+------------------+--------+ ICA Distal66      11                                         +----------+--------+--------+--------+------------------+--------+ ECA       141     2                                          +----------+--------+--------+--------+------------------+--------+ +----------+--------+-------+--------+-------------------+           PSV cm/sEDV cmsDescribeArm Pressure (mmHG) +----------+--------+-------+--------+-------------------+ Subclavian110                                        +----------+--------+-------+--------+-------------------+ +---------+--------+--+--------+--+---------+ VertebralPSV cm/s50EDV cm/s11Antegrade +---------+--------+--+--------+--+---------+  Left Carotid Findings: +----------+--------+--------+--------+------------------+--------+           PSV cm/sEDV cm/sStenosisPlaque DescriptionComments +----------+--------+--------+--------+------------------+--------+ CCA Prox  148                      heterogenous               +----------+--------+--------+--------+------------------+--------+ CCA Distal87      14              heterogenous               +----------+--------+--------+--------+------------------+--------+ ICA Prox  79      17      1-39%   heterogenous               +----------+--------+--------+--------+------------------+--------+ ICA Distal101     24                                         +----------+--------+--------+--------+------------------+--------+ ECA       119                                                +----------+--------+--------+--------+------------------+--------+ +----------+--------+--------+--------+-------------------+           PSV cm/sEDV cm/sDescribeArm Pressure (mmHG) +----------+--------+--------+--------+-------------------+ Subclavian114                                         +----------+--------+--------+--------+-------------------+ +---------+--------+--+--------+-+ VertebralPSV cm/s52EDV cm/s4 +---------+--------+--+--------+-+  Summary: Right Carotid: Velocities in the right ICA are consistent with a 1-39% stenosis. Left Carotid: Velocities in the left ICA are consistent with a 1-39% stenosis. Vertebrals: Bilateral vertebral arteries demonstrate antegrade flow. *See table(s) above for measurements and observations.  Electronically signed by Antony Contras MD on 09/19/2019 at 8:12:59 AM.    Final     PHYSICAL EXAM   Temp:  [98.5 F (36.9 C)-99.9 F (37.7 C)] 98.5 F (36.9 C) (09/24 0700) Pulse Rate:  [60-121] 60 (09/24 0800) Resp:  [18-32] 22 (09/24 0800) BP: (92-153)/(53-118) 118/64 (09/24 0800) SpO2:  [89 %-100 %] 100 % (09/24 0800)  General - obese, well developed, lethargic.  Ophthalmologic - fundi not visualized due to noncooperation.  Cardiovascular - Regular rhythm and rate.  Neuro - lethargic, eyes closed, did not open to voice or pain, however, able to answer all  orientated questions in a severely dysarthic voice. Orientated to place, name, age and month. Right gaze preference, did not move across midline, pupils 1.5 mm, sluggish to light, positive corneal. Severe left facial droop, tongue midline. Left hemiplegia with slight toe withdraw on pain stimulation. Right UE and LE spontaneous movement, RUE against gravity and RLE at least 2/5. Sensation subjective symmetric. Coordination not cooperative. Gait not tested.   ASSESSMENT/PLAN [name] is a 85 y.o. female with history of sciatica, obesity, hypertension, hyperlipidemia and headache presenting with left facial droop, right eye gaze deviation, left-sided flaccidity and no sensation on the left side.   ICH: R large basal ganglia hemorrhage w/ IVH and SAH with cerebral edema and transfalcine herniation likely due to hypertensive etiology  Code Stroke CT head large R basal ganglia hemorrhage w/ IVH and SAH, 4 mm midline shift.     CTA head no vascular lesion seen. No ELVO.  Repeat CT head 9/22 significant increase in size of R basal ganglia ICH and IVH. Mild dilatation L lateral ventricle. Increased midline shift to 15 mm.  Carotid Doppler unremarkable  CT repeat pending  2D Echo EF 65 to 70%  LDL UTC d/t TG > 400. Direct LDL 95.2  HgbA1c 6.3  SCDs for VTE prophylaxis  aspirin 81 mg daily prior to admission, now on No antithrombotic given hemorrhage   Therapy recommendations:  pending   Disposition:  Pending  palliative care on board, continue current care for now. If further decline, daughter will consider comfort care.   Code status - DNR  Cerebral Edema and Transfalcine Herniation  Increase of hemorrhage size since admission on repeat CT  Repeat CT pending  Started on 3% protocol via PIV, drip now off  Na 146-154-154-153->157->159->160  NS gtt now -> 1/2NS  Goal Na 150-155  Hypertensive Emergency  BP as high as 200/110  Home meds:  Cozaar 25  On Cleviprex  drip, was maximized, now off  On labetalol drip, now off   Labetalol and hydralazine PRN . SBP goal < 140 for now  Headache and right thigh pain  HA due to large right ICH  Did not pass swallow  Tylenol PR as needed  IV morphine every 2 hours as needed as per palliative care  Hyperlipidemia  Home meds:  Pravachol 10  LDL UTC as TG > 400, goal < 70. Direct LDL 95.2  Statin on hold given ICH  Continue statin at discharge  Dysphagia . Secondary to stroke . Did not pass swallow . NPO . Speech on board . On IV fluid   Other Stroke Risk Factors  Advanced age  Former Cigarette smoker, quit 25 yrs ago  Morbid Obesity, Body mass index is 43.11 kg/m., recommend weight loss, diet and exercise as appropriate   Family hx stroke (father, brother)  Other Active Problems  Leukocytosis 10.4-14.5-17.9-14.3, likely reactive.  CXR ok.    Hospital day # 3  This patient is critically ill due to large right ICH, IVH, hypertensive urgency, cerebral edema, dysphagia and at significant risk of neurological worsening, death form hematoma expansion, brain herniation, seizure. This patient's care requires constant monitoring of vital signs, hemodynamics, respiratory and cardiac monitoring, review of multiple databases, neurological assessment, discussion with family, other specialists  and medical decision making of high complexity. I spent 35 minutes of neurocritical care time in the care of this patient.   Kendra Hawking, MD PhD Stroke Neurology 09/20/2019 8:43 AM   To contact Stroke Continuity provider, please refer to [email or web address] After hours, contact General Neurology

## 2019-09-21 ENCOUNTER — Inpatient Hospital Stay (HOSPITAL_COMMUNITY): Payer: Medicare Other

## 2019-09-21 LAB — BASIC METABOLIC PANEL
Anion gap: 10 (ref 5–15)
BUN: 12 mg/dL (ref 8–23)
CO2: 25 mmol/L (ref 22–32)
Calcium: 9 mg/dL (ref 8.9–10.3)
Chloride: 116 mmol/L — ABNORMAL HIGH (ref 98–111)
Creatinine, Ser: 0.75 mg/dL (ref 0.44–1.00)
GFR calc Af Amer: >60 mL/min (ref >60)
GFR calc non Af Amer: >60 mL/min (ref >60)
Glucose, Bld: 103 mg/dL — ABNORMAL HIGH (ref 70–99)
Potassium: 3.1 mmol/L — ABNORMAL LOW (ref 3.5–5.1)
Sodium: 151 mmol/L — ABNORMAL HIGH (ref 135–145)

## 2019-09-21 LAB — CBC
HCT: 48 % — ABNORMAL HIGH (ref 36.0–46.0)
Hemoglobin: 14.6 g/dL (ref 12.0–15.0)
MCH: 26.4 pg (ref 26.0–34.0)
MCHC: 30.4 g/dL (ref 30.0–36.0)
MCV: 87 fL (ref 80.0–100.0)
Platelets: 294 10*3/uL (ref 150–400)
RBC: 5.52 MIL/uL — ABNORMAL HIGH (ref 3.87–5.11)
RDW: 15.8 % — ABNORMAL HIGH (ref 11.5–15.5)
WBC: 12.4 10*3/uL — ABNORMAL HIGH (ref 4.0–10.5)
nRBC: 0 % (ref 0.0–0.2)

## 2019-09-21 LAB — GLUCOSE, CAPILLARY
Glucose-Capillary: 112 mg/dL — ABNORMAL HIGH (ref 70–99)
Glucose-Capillary: 118 mg/dL — ABNORMAL HIGH (ref 70–99)
Glucose-Capillary: 120 mg/dL — ABNORMAL HIGH (ref 70–99)

## 2019-09-21 LAB — SODIUM
Sodium: 149 mmol/L — ABNORMAL HIGH (ref 135–145)
Sodium: 148 mmol/L — ABNORMAL HIGH (ref 135–145)
Sodium: 147 mmol/L — ABNORMAL HIGH (ref 135–145)

## 2019-09-21 MED ORDER — SODIUM CHLORIDE 0.9 % IV SOLN
INTRAVENOUS | Status: DC
  Administered 2019-09-21: 11:00:00 via INTRAVENOUS

## 2019-09-21 MED ORDER — PRO-STAT SUGAR FREE PO LIQD
30.0000 mL | Freq: Two times a day (BID) | ORAL | Status: DC
  Administered 2019-09-21 – 2019-09-27 (×12): 30 mL
  Filled 2019-09-21 (×13): qty 30

## 2019-09-21 MED ORDER — LORAZEPAM 2 MG/ML IJ SOLN
1.0000 mg | Freq: Once | INTRAMUSCULAR | Status: DC | PRN
  Filled 2019-09-21: qty 1

## 2019-09-21 MED ORDER — OSMOLITE 1.2 CAL PO LIQD
1000.0000 mL | ORAL | Status: DC
  Administered 2019-09-21 – 2019-09-25 (×5): 1000 mL
  Filled 2019-09-21 (×11): qty 1000

## 2019-09-21 MED ORDER — LIDOCAINE VISCOUS HCL 2 % MT SOLN
15.0000 mL | Freq: Once | OROMUCOSAL | Status: AC
  Administered 2019-09-21: 6 mL via OROMUCOSAL
  Filled 2019-09-21: qty 15

## 2019-09-21 MED ORDER — POTASSIUM CHLORIDE 10 MEQ/100ML IV SOLN
10.0000 meq | INTRAVENOUS | Status: AC
  Administered 2019-09-21 (×6): 10 meq via INTRAVENOUS
  Filled 2019-09-21 (×6): qty 100

## 2019-09-21 MED ORDER — IOHEXOL 300 MG/ML  SOLN
25.0000 mL | Freq: Once | INTRAMUSCULAR | Status: AC | PRN
  Administered 2019-09-21: 13:00:00 25 mL

## 2019-09-21 MED ORDER — SODIUM CHLORIDE 3 % IV SOLN
INTRAVENOUS | Status: AC
  Administered 2019-09-21 – 2019-09-22 (×2): 50 mL/h via INTRAVENOUS
  Filled 2019-09-21 (×3): qty 500

## 2019-09-21 NOTE — Progress Notes (Signed)
Inpatient Rehab Admissions:  Inpatient Rehab Consult received. Per palliative care documentation, will attempt to pass cortrak for temporary nutrition, but daughter feels pt would not like long-term alternative nutrition means via PEG.  SLP feels pt not ready for objective swallow assessment at this time.  Spoke with Burnetta Sabin, NP, re: possible consult order on 9/24, and determined to wait for CIR to assess pt when [place] regarding nutrition were more clear.  Will follow from a distance.   Shann Medal, PT, DPT Admissions Coordinator [phone] 09/21/19  2:41 PM

## 2019-09-21 NOTE — Progress Notes (Signed)
  Speech Language Pathology Treatment: Dysphagia  Patient Details Name: [name] MRN: [id number] DOB: [birth year] Today's Date: 09/21/2019 Time: [id number] SLP Time Calculation (min) (ACUTE ONLY): 21 min  Assessment / Plan / Recommendation Clinical Impression  Increased time to respond today and verbal cueing to arouse with daughter at bedside. Yesterday she was alert majority of the day; good participation with therapy and likely recovering today. Followed simple directives during po trials with ice chips. Holding and decreased manipulation as expected; weak cough without vocal cord adduction needed to achieve necessary pressure for effective cough. SLP discussed goals for nutrition with daughter who wishes for objective swallow assessment prior to initiating food/liquid. Relayed to daughter that pt is not appropriate at this time which she verbalized understanding. Cortrak attempted and unsuccessful at bedside per RN. She will have placed in fluoro this afternoon. Speech dysarthric with daughter understanding 75% of short phrases. Continue support for speech/cognition and swallow.    HPI HPI: [name] is an 85 y.o. F with a history of sciatica, obesity, hypertension, hyperlipidemia and headache. Pt was with daughter and had sudden onset L side weakness, L facial droop, and slurred speech. Upon arrival L neglect was noted with no sensation retained on L side. 9/21 head CT showed large right basal ganglia hemorrhage with intraventricular and subarachnoid extension and 4 mm of right-to-left midline shift. Following 9/22 CT demonstrated significant increase in size of R basal ganglia hemorrhage, increase in intraventricular extension of blood, possible ventricular outflow obstruction, and increase in mass effect of midline shift from 56mm to 63mm. Neuro consulted. Pt placed on nasal cannula oxygen 9/21, CXR was unremarkable. She is currently requiring suction to clear secretions, diet is NPO at  this time.       SLP Plan  Continue with current plan of care       Recommendations  Diet recommendations: NPO Medication Administration: Via alternative means                Oral Care Recommendations: Oral care QID Follow up Recommendations: Skilled Nursing facility SLP Visit Diagnosis: Dysphagia, unspecified (R13.10);Cognitive communication deficit [id number]) Plan: Continue with current plan of care                      Houston Siren 09/21/2019, 11:18 AM  [name] Colvin Caroli.Ed Risk analyst [phone] Office [phone]

## 2019-09-21 NOTE — Progress Notes (Signed)
Cortrak Tube Team Note:  Consult received to place a Cortrak feeding tube.   Multiple attempts made to pass tube through the esophagus without success. Pt sent to diagnostic radiology and a 10 fr panda was placed in the L nare at the 85 cm marking. RD secured tube with bridle.   Mariana Single RD, LDN Clinical Nutrition Pager # [phone]

## 2019-09-21 NOTE — Progress Notes (Signed)
Physical Therapy Treatment Patient Details Name: [name] MRN: [id number] DOB: Mar 23, 1934 Today's Date: 09/21/2019    History of Present Illness Pt is an 85 yo female admitted with L facial droop, dysarthria, and L side flaccid. Pt found to have R basal ganglia hemorrhage with IVH and SAH with cerebral edema and transfalcine herniation likely due to hypertensive urgency inital 61mm midline shift.  Pt with significant PMH of obesity, HTN, back pain s/p spine surgery, R knee surgery after fx.      PT Comments    Pt much more lethargic today, not as responsive, followed only one command for me.  Positioned in max chair mode and worked on bringing trunk off of bed into fully upright sitting.  Pt with left lateral lean without attempts to correct even with R hand positioned on rail.  PT will continue to follow acutely for safe mobility progression.   Follow Up Recommendations  CIR     Equipment Recommendations  Wheelchair (measurements PT);Wheelchair cushion (measurements PT);Hospital bed;Other (comment)(hoyer lift)    Recommendations for Other Services   NA     Precautions / Restrictions Precautions Precautions: Fall Precaution Comments: SBP goal <160    Mobility  Bed Mobility Overal bed mobility: Needs Assistance Bed Mobility: Supine to Sit     Supine to sit: Max assist;HOB elevated     General bed mobility comments: Max assist to bring trunk off of bed with bed in chair mode.  Pt with left lateral lean once sitting, attempted with hand over hand assist to get pt to hold onto R rail with R hand without success.  Significant R gaze, attempted to range head to the left with firm end feel.   Transfers                 General transfer comment: Plan for lift OOB to chair today, but pt more lethargic, not yet appropriate.       Modified Rankin (Stroke Patients Only) Modified Rankin (Stroke Patients Only) Pre-Morbid Rankin Score: No symptoms Modified Rankin: Severe  disability     Balance Overall balance assessment: Needs assistance Sitting-balance support: Feet unsupported;Single extremity supported Sitting balance-Leahy Scale: Zero Sitting balance - Comments: max assist to maintain balance with trunk off of bed in fully upright chair position, no attempts to correct posture or hold onto rail.                                     Cognition Arousal/Alertness: Lethargic Behavior During Therapy: Flat affect Overall Cognitive Status: Impaired/Different from baseline Area of Impairment: Orientation;Memory;Attention;Following commands;Safety/judgement;Awareness;Problem solving                   Current Attention Level: Focused   Following Commands: Follows one step commands inconsistently;Follows one step commands with increased time Safety/Judgement: Decreased awareness of safety;Decreased awareness of deficits Awareness: Intellectual Problem Solving: Slow processing;Decreased initiation;Difficulty sequencing;Requires verbal cues;Requires tactile cues General Comments: Pt much more lethargic today.  not as interactive, not following commands (only one x 1).       Exercises General Exercises - Upper Extremity Shoulder Flexion: PROM;Both;10 reps Elbow Flexion: PROM;Both;10 reps Wrist Extension: PROM;Both;10 reps General Exercises - Lower Extremity Ankle Circles/Pumps: PROM;Both;10 reps Heel Slides: PROM;Both;10 reps    General Comments General comments (skin integrity, edema, etc.): VSS throughout.  BP lower than goal of 0000000 systolic      Pertinent Vitals/Pain Pain Assessment:  Faces Faces Pain Scale: No hurt Pain Intervention(s): Monitored during session           PT Goals (current goals can now be found in the care plan section) Acute Rehab PT Goals Patient Stated Goal: to get better Progress towards PT goals: Progressing toward goals    Frequency    Min 4X/week      PT Plan Current plan remains  appropriate       AM-PAC PT "6 Clicks" Mobility   Outcome Measure  Help needed turning from your back to your side while in a flat bed without using bedrails?: Total Help needed moving from lying on your back to sitting on the side of a flat bed without using bedrails?: Total Help needed moving to and from a bed to a chair (including a wheelchair)?: Total Help needed standing up from a chair using your arms (e.g., wheelchair or bedside chair)?: Total Help needed to walk in hospital room?: Total Help needed climbing 3-5 steps with a railing? : Total 6 Click Score: 6    End of Session   Activity Tolerance: Patient limited by lethargy Patient left: in bed;with call bell/phone within reach;with bed alarm set   PT Visit Diagnosis: Muscle weakness (generalized) (M62.81);Difficulty in walking, not elsewhere classified (R26.2);Hemiplegia and hemiparesis Hemiplegia - Right/Left: Left Hemiplegia - dominant/non-dominant: Non-dominant Hemiplegia - caused by: Nontraumatic intracerebral hemorrhage     Time: [id number] PT Time Calculation (min) (ACUTE ONLY): 21 min  Charges:  $Therapeutic Activity: 8-22 mins                    Emrah Ariola B. Draken Farrior, PT, DPT  Acute Rehabilitation [phone] pager [phone] office  @ [facility]: [phone]   09/21/2019, 4:54 PM

## 2019-09-21 NOTE — Progress Notes (Signed)
STROKE TEAM PROGRESS NOTE   INTERVAL HISTORY Pt daughter at bedside. Dietitian tried to do cortrak at bedside but not successful. Will need fluoro for cortrak. Otherwise, pt neuro stable. Continue 3%   Vitals:   09/21/19 0400 09/21/19 0500 09/21/19 0600 09/21/19 0700  BP: (!) 157/81 (!) 172/86 (!) 150/87 (!) 114/52  Pulse: 70 78 (!) 57 (!) 59  Resp: 17 19 17  (!) 22  Temp: 98.6 F (37 C)     TempSrc: Axillary     SpO2: 100% 100% 100% 98%  Weight:      Height:        CBC:  Recent Labs  Lab 09/17/19 1309  09/20/19 0610 09/21/19 0529  WBC 10.4   < > 14.3* 12.4*  NEUTROABS 5.4  --   --   --   HGB 14.4   < > 14.1 14.6  HCT 48.3*   < > 47.7* 48.0*  MCV 87.7   < > 88.8 87.0  PLT 363   < > 162 294   < > = values in this interval not displayed.    Basic Metabolic Panel:  Recent Labs  Lab 09/20/19 0610  09/20/19 2232 09/21/19 0529  NA 160*   < > 154* 151*  K 4.0  --   --  3.1*  CL 128*  --   --  116*  CO2 20*  --   --  25  GLUCOSE 118*  --   --  103*  BUN 16  --   --  12  CREATININE 0.79  --   --  0.75  CALCIUM 8.6*  --   --  9.0   < > = values in this interval not displayed.   Lipid Panel:     Component Value Date/Time   CHOL 188 09/18/2019 0327   TRIG 179 (H) 09/20/2019 0610   HDL 47 09/18/2019 0327   CHOLHDL 4.0 09/18/2019 0327   VLDL UNABLE TO CALCULATE IF TRIGLYCERIDE OVER 400 mg/dL 09/18/2019 0327   LDLCALC UNABLE TO CALCULATE IF TRIGLYCERIDE OVER 400 mg/dL 09/18/2019 0327   LDLCALC 117 (H) 02/01/2018 1043   HgbA1c:  Lab Results  Component Value Date   HGBA1C 6.3 (H) 09/18/2019   Urine Drug Screen: No results found for: LABOPIA, COCAINSCRNUR, LABBENZ, AMPHETMU, THCU, LABBARB  Alcohol Level No results found for: ETH  IMAGING Ct Head Wo Contrast  Result Date: 09/20/2019 CLINICAL DATA:  Intracranial hemorrhage, known, follow-up. EXAM: CT HEAD WITHOUT CONTRAST TECHNIQUE: Contiguous axial images were obtained from the base of the skull through the vertex  without intravenous contrast. COMPARISON:  Head CT 09/18/2019 FINDINGS: Brain: Motion degraded exam. Again demonstrated is a very large parenchymal hemorrhage centered within the right basal ganglia and extending superiorly into the right frontoparietal lobes. The hemorrhage has not significantly changed in size since prior head CT 09/18/2019, with the dominant portion of the hemorrhage measuring 7.8 x 5.6 x 4.7 cm (AP x TV x CC) (previously 7.8 x 5.3 x 4.7 cm). Similar surrounding parenchymal edema. Intraventricular extension of hemorrhage into the lateral, third and fourth ventricles is similar to prior exam. Significant mass effect with effacement of right cerebral sulci, near complete effacement of the right lateral ventricle and asymmetric prominence of the left lateral ventricle concerning for ventricular entrapment. Unchanged 13 mm leftward midline shift (remeasured on prior) with suspected component of early right uncal herniation. Vascular: No hyperdense vessel. Skull: No calvarial fracture. Sinuses/Orbits: Mild scattered paranasal sinus mucosal thickening. No significant mastoid  effusion. IMPRESSION: 1. No significant interval change as compared to head CT 09/18/2019 performed at 1:35 a.m. 2. Similar size of a very large parenchymal hemorrhage centered within the right basal ganglia. 3. Associated mass effect with near complete effacement of the right lateral ventricle, 13 mm leftward midline shift and asymmetric prominence of the left lateral ventricle concerning for ventricular entrapment. Early uncal herniation again suspected. 4. Unchanged intraventricular extension of hemorrhage. Electronically Signed   By: Kellie Simmering   On: 09/20/2019 15:19    PHYSICAL EXAM  Temp:  [97.9 F (36.6 C)-98.8 F (37.1 C)] 98.6 F (37 C) (09/25 0400) Pulse Rate:  [51-131] 59 (09/25 0700) Resp:  [14-23] 22 (09/25 0700) BP: (114-172)/(52-102) 114/52 (09/25 0700) SpO2:  [94 %-100 %] 98 % (09/25 0700)  General -  obese, well developed, lethargic.  Ophthalmologic - fundi not visualized due to noncooperation.  Cardiovascular - Regular rhythm and rate.  Neuro - lethargic, eyes closed, did not open to voice or pain, however, able to answer all orientated questions in a severely dysarthic voice. Orientated to place, name, age and month. Right gaze preference, did not move across midline, pupils 1.5 mm, sluggish to light, positive corneal. Severe left facial droop, tongue midline. Left hemiplegia with slight toe withdraw on pain stimulation. Right UE and LE spontaneous movement, RUE against gravity and RLE at least 2/5. Sensation subjective symmetric. Coordination not cooperative. Gait not tested.   ASSESSMENT/PLAN [name] is a 85 y.o. female with history of sciatica, obesity, hypertension, hyperlipidemia and headache presenting with left facial droop, right eye gaze deviation, left-sided flaccidity and no sensation on the left side.   ICH: R large basal ganglia hemorrhage w/ IVH and SAH with cerebral edema and transfalcine herniation likely due to hypertensive etiology  Code Stroke CT head large R basal ganglia hemorrhage w/ IVH and SAH, 4 mm midline shift.     CTA head no vascular lesion seen. No ELVO.  Repeat CT head 9/22 significant increase in size of R basal ganglia ICH and IVH. Mild dilatation L lateral ventricle. Increased midline shift to 15 mm.  Carotid Doppler unremarkable  CT repeat 9/24 no significant change R basal ganglia hemorrhage 66mm midline shift, concern for ventricular entrapment and early uncal herniation. IVH stable.  2D Echo EF 65 to 70%  LDL UTC d/t TG > 400. Direct LDL 95.2  HgbA1c 6.3  SCDs for VTE prophylaxis  aspirin 81 mg daily prior to admission, now on No antithrombotic given hemorrhage   Therapy recommendations:  CIR  Disposition:  Pending  palliative care on board, continue current care for now. If further decline, daughter will consider comfort  care.   Code status - DNR  Cerebral Edema and Transfalcine Herniation  Increase of hemorrhage size since admission on repeat CT  Repeat CT no significant change R basal ganglia hemorrhage 51mm midline shift, concern for ventricular entrapment and early uncal herniation. IVH stable.  Started on 3% protocol via PIV, drip now off  Na 146-154-154-153->157->159->160-156-154-151  NS gtt now -> 1/2NS-> NS  Goal Na 150-155  Hypertensive Emergency  BP as high as 200/110  Home meds:  Cozaar 25  On Cleviprex drip, was maximized, now off  On labetalol drip, now off   Labetalol and hydralazine PRN . SBP goal < 160 for now  Headache and right thigh pain  HA due to large right ICH  Did not pass swallow  Tylenol PR as needed  IV morphine every 2 hours as  needed as per palliative care  Hyperlipidemia  Home meds:  Pravachol 10  LDL UTC as TG > 400, goal < 70. Direct LDL 95.2  Statin on hold given ICH  Continue statin at discharge  Dysphagia . Secondary to stroke . Did not pass swallow . NPO . Place Cortrak and start TF . Speech on board . On IV fluid . dtr does not think she would want PEG   Other Stroke Risk Factors  Advanced age  Former Cigarette smoker, quit 25 yrs ago  Morbid Obesity, Body mass index is 43.11 kg/m., recommend weight loss, diet and exercise as appropriate   Family hx stroke (father, brother)  Other Active Problems  Leukocytosis 10.4-14.5-17.9-14.3-12.4, likely reactive.  CXR ok.    Hospital day # 4  This patient is critically ill due to large right ICH, IVH, hypertensive urgency, cerebral edema, dysphagia and at significant risk of neurological worsening, death form hematoma expansion, brain herniation, seizure. This patient's care requires constant monitoring of vital signs, hemodynamics, respiratory and cardiac monitoring, review of multiple databases, neurological assessment, discussion with family, other specialists and medical  decision making of high complexity. I spent 35 minutes of neurocritical care time in the care of this patient. I had long discussion with daughter at bedside, updated pt current condition, treatment plan and potential prognosis, and answered all the questions. She expressed understanding and appreciation.    Kendra Hawking, MD PhD Stroke Neurology 09/21/2019 8:25 AM   To contact Stroke Continuity provider, please refer to [email or web address] After hours, contact General Neurology

## 2019-09-21 NOTE — Progress Notes (Signed)
Followed up with spiritual support for Miss Calvey. She was sleeping and no family present at this time. I offered Miss. Brasil spiritual support with ministry of presence and silent prayer.   Her Pastor, Dr. Laverta Baltimore and PRN Chaplain requested spiritual care for Miss Mascari. PT is a long standing member of [facility] [facility].   Palliative care Resident Chaplain  Fidel Levy [phone]

## 2019-09-21 NOTE — Progress Notes (Signed)
Initial Nutrition Assessment  DOCUMENTATION CODES:   Morbid obesity  INTERVENTION:   Initiate Osmolite 1.2 @ 55 ml/hr (1320 ml/day) via small bore feeding tube 30 ml Prostat BID  Provides: 1754 kcal, 103 grams protein, and 1070 ml free water.    NUTRITION DIAGNOSIS:   Inadequate oral intake related to inability to eat as evidenced by NPO status.  GOAL:   Patient will meet greater than or equal to 90% of their needs   MONITOR:   TF tolerance  REASON FOR ASSESSMENT:   Consult Enteral/tube feeding initiation and management  ASSESSMENT:   Pt with PMH of obesity, HTN, HLD admitted with R large IVH and SAH with cerebral edema and transfalcine herniation likely due to HTN.   Pt discussed during ICU rounds and with RN.   9/25 cortrak placed by IR  Medications reviewed and include: senokot, 10 mEq KCl IV x 6  Labs reviewed: K+ 3.1 (L)    NUTRITION - FOCUSED PHYSICAL EXAM:    Most Recent Value  Orbital Region  No depletion  Upper Arm Region  No depletion  Thoracic and Lumbar Region  No depletion  Buccal Region  No depletion  Temple Region  No depletion  Clavicle Bone Region  No depletion  Clavicle and Acromion Bone Region  No depletion  Scapular Bone Region  Unable to assess  Dorsal Hand  No depletion  Patellar Region  No depletion  Anterior Thigh Region  No depletion  Posterior Calf Region  No depletion  Edema (RD Assessment)  None  Hair  Reviewed  Eyes  Unable to assess  Mouth  Unable to assess  Skin  Reviewed  Nails  Reviewed       Diet Order:   Diet Order            Diet NPO time specified  Diet effective now              EDUCATION NEEDS:   No education needs have been identified at this time  Skin:  Skin Assessment: Reviewed RN Assessment  Last BM:  9/25 type 6  Height:   Ht Readings from Last 1 Encounters:  09/17/19 5\' 3"  (1.6 m)    Weight:   Wt Readings from Last 1 Encounters:  09/17/19 110.4 kg    Ideal Body Weight:   52.2 kg  BMI:  Body mass index is 43.11 kg/m.  Estimated Nutritional Needs:   Kcal:  1600-1800  Protein:  85-100 grams  Fluid:  > 1.6 L/day  Maylon Peppers RD, LDN, CNSC [phone] Pager [phone] After Hours Pager

## 2019-09-22 ENCOUNTER — Inpatient Hospital Stay (HOSPITAL_COMMUNITY): Payer: Medicare Other

## 2019-09-22 DIAGNOSIS — I610 Nontraumatic intracerebral hemorrhage in hemisphere, subcortical: Secondary | ICD-10-CM

## 2019-09-22 LAB — GLUCOSE, CAPILLARY
Glucose-Capillary: 143 mg/dL — ABNORMAL HIGH (ref 70–99)
Glucose-Capillary: 137 mg/dL — ABNORMAL HIGH (ref 70–99)
Glucose-Capillary: 137 mg/dL — ABNORMAL HIGH (ref 70–99)
Glucose-Capillary: 145 mg/dL — ABNORMAL HIGH (ref 70–99)
Glucose-Capillary: 117 mg/dL — ABNORMAL HIGH (ref 70–99)
Glucose-Capillary: 108 mg/dL — ABNORMAL HIGH (ref 70–99)

## 2019-09-22 LAB — BASIC METABOLIC PANEL
Anion gap: 9 (ref 5–15)
BUN: 16 mg/dL (ref 8–23)
CO2: 21 mmol/L — ABNORMAL LOW (ref 22–32)
Calcium: 8.4 mg/dL — ABNORMAL LOW (ref 8.9–10.3)
Chloride: 119 mmol/L — ABNORMAL HIGH (ref 98–111)
Creatinine, Ser: 0.75 mg/dL (ref 0.44–1.00)
GFR calc Af Amer: >60 mL/min (ref >60)
GFR calc non Af Amer: >60 mL/min (ref >60)
Glucose, Bld: 132 mg/dL — ABNORMAL HIGH (ref 70–99)
Potassium: 3.6 mmol/L (ref 3.5–5.1)
Sodium: 149 mmol/L — ABNORMAL HIGH (ref 135–145)

## 2019-09-22 LAB — URINALYSIS, COMPLETE (UACMP) WITH MICROSCOPIC
Bacteria, UA: NONE SEEN
Bilirubin Urine: NEGATIVE
Glucose, UA: NEGATIVE mg/dL
Hgb urine dipstick: NEGATIVE
Ketones, ur: NEGATIVE mg/dL
Leukocytes,Ua: NEGATIVE
Nitrite: NEGATIVE
Protein, ur: NEGATIVE mg/dL
Specific Gravity, Urine: 1.015 (ref 1.005–1.030)
pH: 5 (ref 5.0–8.0)

## 2019-09-22 LAB — MAGNESIUM: Magnesium: 2.1 mg/dL (ref 1.7–2.4)

## 2019-09-22 LAB — SODIUM
Sodium: 152 mmol/L — ABNORMAL HIGH (ref 135–145)
Sodium: 156 mmol/L — ABNORMAL HIGH (ref 135–145)
Sodium: 156 mmol/L — ABNORMAL HIGH (ref 135–145)

## 2019-09-22 LAB — CBC
HCT: 45 % (ref 36.0–46.0)
Hemoglobin: 14.1 g/dL (ref 12.0–15.0)
MCH: 27 pg (ref 26.0–34.0)
MCHC: 31.3 g/dL (ref 30.0–36.0)
MCV: 86 fL (ref 80.0–100.0)
Platelets: 292 10*3/uL (ref 150–400)
RBC: 5.23 MIL/uL — ABNORMAL HIGH (ref 3.87–5.11)
RDW: 15.5 % (ref 11.5–15.5)
WBC: 12.1 10*3/uL — ABNORMAL HIGH (ref 4.0–10.5)
nRBC: 0 % (ref 0.0–0.2)

## 2019-09-22 LAB — C DIFFICILE QUICK SCREEN W PCR REFLEX
C Diff antigen: NEGATIVE
C Diff interpretation: NOT DETECTED
C Diff toxin: NEGATIVE

## 2019-09-22 MED ORDER — POTASSIUM CHLORIDE 20 MEQ PO PACK
40.0000 meq | PACK | Freq: Two times a day (BID) | ORAL | Status: DC
  Administered 2019-09-22: 40 meq via ORAL
  Filled 2019-09-22 (×2): qty 2

## 2019-09-22 MED ORDER — LOPERAMIDE HCL 1 MG/7.5ML PO SUSP
2.0000 mg | Freq: Two times a day (BID) | ORAL | Status: DC | PRN
  Administered 2019-09-23: 2 mg
  Filled 2019-09-22 (×2): qty 15

## 2019-09-22 MED ORDER — POTASSIUM CHLORIDE 20 MEQ PO PACK
40.0000 meq | PACK | Freq: Two times a day (BID) | ORAL | Status: AC
  Administered 2019-09-22: 40 meq

## 2019-09-22 MED ORDER — SODIUM CHLORIDE 0.9 % IV SOLN
INTRAVENOUS | Status: DC
  Administered 2019-09-22 – 2019-09-26 (×5): via INTRAVENOUS
  Administered 2019-09-27: 10:00:00 990 mL via INTRAVENOUS

## 2019-09-22 NOTE — Progress Notes (Signed)
STROKE TEAM PROGRESS NOTE   INTERVAL HISTORY No family at beside. Otherwise, pt neuro stable. Continue 3%.   Vitals:   09/22/19 1400 09/22/19 1500 09/22/19 1600 09/22/19 1700  BP: (!) 158/77 (!) 178/102 (!) 117/103 (!) 154/86  Pulse:      Resp:      Temp:   98.8 F (37.1 C)   TempSrc:   Axillary   SpO2:      Weight:      Height:        CBC:  Recent Labs  Lab 09/17/19 1309  09/21/19 0529 09/22/19 0448  WBC 10.4   < > 12.4* 12.1*  NEUTROABS 5.4  --   --   --   HGB 14.4   < > 14.6 14.1  HCT 48.3*   < > 48.0* 45.0  MCV 87.7   < > 87.0 86.0  PLT 363   < > 294 292   < > = values in this interval not displayed.    Basic Metabolic Panel:  Recent Labs  Lab 09/21/19 0529  09/22/19 0448 09/22/19 0942 09/22/19 1556  NA 151*   < > 149* 152* 156*  K 3.1*  --  3.6  --   --   CL 116*  --  119*  --   --   CO2 25  --  21*  --   --   GLUCOSE 103*  --  132*  --   --   BUN 12  --  16  --   --   CREATININE 0.75  --  0.75  --   --   CALCIUM 9.0  --  8.4*  --   --   MG  --   --  2.1  --   --    < > = values in this interval not displayed.   Lipid Panel:     Component Value Date/Time   CHOL 188 09/18/2019 0327   TRIG 179 (H) 09/20/2019 0610   HDL 47 09/18/2019 0327   CHOLHDL 4.0 09/18/2019 0327   VLDL UNABLE TO CALCULATE IF TRIGLYCERIDE OVER 400 mg/dL 09/18/2019 0327   LDLCALC UNABLE TO CALCULATE IF TRIGLYCERIDE OVER 400 mg/dL 09/18/2019 0327   LDLCALC 117 (H) 02/01/2018 1043   HgbA1c:  Lab Results  Component Value Date   HGBA1C 6.3 (H) 09/18/2019   Urine Drug Screen: No results found for: LABOPIA, COCAINSCRNUR, LABBENZ, AMPHETMU, THCU, LABBARB  Alcohol Level No results found for: ETH  IMAGING  Dg Abd 1 View 09/21/2019 IMPRESSION:  Feeding tube placed into descending duodenum, contrast injected to confirm placement as above.   Ct Head Wo Contrast 09/22/2019 IMPRESSION:  1. No significant interval change in size and appearance of large right basal ganglia  intraparenchymal hemorrhage with intraventricular extension. Associated regional mass effect with 10 mm of right-to-left midline shift, not significantly changed.  2. No other new acute intracranial abnormality.   Ct Head Wo Contrast 09/20/2019 IMPRESSION:  1. No significant interval change as compared to head CT 09/18/2019 performed at 1:35 a.m.  2. Similar size of a very large parenchymal hemorrhage centered within the right basal ganglia.  3. Associated mass effect with near complete effacement of the right lateral ventricle, 13 mm leftward midline shift and asymmetric prominence of the left lateral ventricle concerning for ventricular entrapment. Early uncal herniation again suspected.  4. Unchanged intraventricular extension of hemorrhage.   Dg Naso G Tube Plc W/fl-no Rad 09/21/2019 IMPRESSION:  Feeding tube placed into descending duodenum,  contrast injected to confirm placement as above.    PHYSICAL EXAM   Temp:  [98.8 F (37.1 C)-100.3 F (37.9 C)] 98.8 F (37.1 C) (09/26 1600) Pulse Rate:  [75-101] 101 (09/26 0800) Resp:  [18-26] 24 (09/26 0800) BP: (117-178)/(69-130) 154/86 (09/26 1700) SpO2:  [96 %-100 %] 100 % (09/26 0800) Weight:  [108.6 kg] 108.6 kg (09/26 0500)  General - obese, well developed, lethargic.  Ophthalmologic - fundi not visualized due to noncooperation.  Cardiovascular - Regular rhythm and rate.  Neuro - lethargic, eyes closed, did not open to voice or pain, however, able to answer her name, not month, knows year, in a severely dysarthic voice. Right gaze preference, did not move across midline, pupils 1.5 mm, sluggish to light, positive corneal. Severe left facial droop, tongue midline. Left hemiplegia with slight toe withdraw on pain stimulation. Right UE and LE spontaneous movement, RUE against gravity and RLE at least 2/5. Sensation subjective intact bilaterally per patient. Coordination not cooperative. Gait not tested.   ASSESSMENT/PLAN Ms.  [name] is a 85 y.o. female with history of sciatica, obesity, hypertension, hyperlipidemia and headache presenting with left facial droop, right eye gaze deviation, left-sided flaccidity and no sensation on the left side.   ICH: R large basal ganglia hemorrhage w/ IVH and SAH with cerebral edema and transfalcine herniation likely due to hypertensive etiology  Code Stroke CT head large R basal ganglia hemorrhage w/ IVH and SAH, 4 mm midline shift.     CTA head no vascular lesion seen. No ELVO.  Repeat CT head 9/22 significant increase in size of R basal ganglia ICH and IVH. Mild dilatation L lateral ventricle. Increased midline shift to 15 mm.  Carotid Doppler unremarkable  CT repeat 9/24 no significant change R basal ganglia hemorrhage 80mm midline shift, concern for ventricular entrapment and early uncal herniation. IVH stable.  CT repeat 9/26 - No significant interval change in size and appearance of large right basal ganglia intraparenchymal hemorrhage with intraventricular extension. Associated regional mass effect with 10 mm of right-to-left midline shift, not significantly changed.   2D Echo EF 65 to 70%  LDL UTC d/t TG > 400. Direct LDL 95.2  HgbA1c 6.3  SCDs for VTE prophylaxis  aspirin 81 mg daily prior to admission, now on No antithrombotic given hemorrhage   Therapy recommendations:  CIR  Disposition:  Pending  Palliative care on board, continue current care for now. If further decline, daughter will consider comfort care.   Code status - DNR  Cerebral Edema and Transfalcine Herniation  Increase of hemorrhage size since admission on repeat CT  Repeat CT no significant change R basal ganglia hemorrhage 88mm midline shift, concern for ventricular entrapment and early uncal herniation. IVH stable.  CT repeat 9/26 - No significant interval change in size and appearance of large right basal ganglia intraparenchymal hemorrhage with intraventricular extension.  Associated regional mass effect with 10 mm of right-to-left midline shift, not significantly changed.   Started on 3% protocol via PIV, drip now off -> restarted Friday ? - 50 cc / hr  Na 146-154-154-153->157->159->160-156-154-151->147->149  NS gtt now -> 1/2NS-> NS  Goal Na 150-155  Hypertensive Emergency  BP as high as 200/110  Home meds:  Cozaar 25  On Cleviprex drip, was maximized, now off  On labetalol drip, now off   Labetalol and hydralazine PRN . SBP goal < 160 for now (SBP 149 - 162 today - monitor for now)  Headache and right thigh pain  HA due to large right ICH  Did not pass swallow  Tylenol PR as needed  IV morphine every 2 hours as needed as per palliative care  Hyperlipidemia  Home meds:  Pravachol 10  LDL UTC as TG > 400, goal < 70. Direct LDL 95.2  Statin on hold given ICH  Continue statin at discharge  Dysphagia . Secondary to stroke . Did not pass swallow . NPO . Place Cortrak and start TF . Speech on board . On IV fluid . dtr does not think she would want PEG   Other Stroke Risk Factors  Advanced age  Former Cigarette smoker, quit 25 yrs ago  Morbid Obesity, Body mass index is 42.41 kg/m., recommend weight loss, diet and exercise as appropriate   Family hx stroke (father, brother)  Other Active Problems  Leukocytosis 10.4-14.5-17.9-14.3-12.4->12.1 likely reactive.  CXR ok.    Fever - 100.2 (ax) - repeat CXR ; U/A & Culture ; Blood Cultures  Hypokalemia - 3.1 - repleated -> 3.6  Repeat labs in AM  Dr Rory Percy saw pt early this AM for multiple loose stools - consider C-diff if liquid.  Hospital day # 5  This patient is critically ill due to large right ICH, IVH, hypertensive urgency, cerebral edema, dysphagia and at significant risk of neurological worsening, death form hematoma expansion, brain herniation, seizure. This patient's care requires constant monitoring of vital signs, hemodynamics, respiratory and cardiac  monitoring, review of multiple databases, neurological assessment, discussion with family, other specialists and medical decision making of high complexity. I spent 30 minutes of neurocritical care time in the care of this patient.   Personally examined patient and images, and have participated in and made any corrections needed to history, physical, neuro exam,assessment and plan as stated above.  I have personally obtained the history, evaluated lab date, reviewed imaging studies and agree with radiology interpretations.    Sarina Ill, MD Stroke Neurology  To contact Stroke Continuity provider, please refer to [email or web address] After hours, contact General Neurology

## 2019-09-22 NOTE — Progress Notes (Signed)
Multiple loose stools. Request for flexiseal from RN. Order placed.

## 2019-09-23 DIAGNOSIS — R509 Fever, unspecified: Secondary | ICD-10-CM

## 2019-09-23 DIAGNOSIS — I10 Essential (primary) hypertension: Secondary | ICD-10-CM

## 2019-09-23 DIAGNOSIS — I619 Nontraumatic intracerebral hemorrhage, unspecified: Secondary | ICD-10-CM

## 2019-09-23 DIAGNOSIS — Z515 Encounter for palliative care: Secondary | ICD-10-CM

## 2019-09-23 DIAGNOSIS — R1310 Dysphagia, unspecified: Secondary | ICD-10-CM

## 2019-09-23 LAB — BASIC METABOLIC PANEL
Anion gap: 8 (ref 5–15)
BUN: 19 mg/dL (ref 8–23)
CO2: 23 mmol/L (ref 22–32)
Calcium: 9.1 mg/dL (ref 8.9–10.3)
Chloride: 125 mmol/L — ABNORMAL HIGH (ref 98–111)
Creatinine, Ser: 0.82 mg/dL (ref 0.44–1.00)
GFR calc Af Amer: >60 mL/min (ref >60)
GFR calc non Af Amer: >60 mL/min (ref >60)
Glucose, Bld: 132 mg/dL — ABNORMAL HIGH (ref 70–99)
Potassium: 3.7 mmol/L (ref 3.5–5.1)
Sodium: 156 mmol/L — ABNORMAL HIGH (ref 135–145)

## 2019-09-23 LAB — URINE CULTURE: Culture: <10000 — AB

## 2019-09-23 LAB — CBC WITH DIFFERENTIAL/PLATELET
Abs Immature Granulocytes: 0.05 10*3/uL (ref 0.00–0.07)
Basophils Absolute: 0 10*3/uL (ref 0.0–0.1)
Basophils Relative: 0 %
Eosinophils Absolute: 0.2 10*3/uL (ref 0.0–0.5)
Eosinophils Relative: 1 %
HCT: 45.8 % (ref 36.0–46.0)
Hemoglobin: 14.3 g/dL (ref 12.0–15.0)
Immature Granulocytes: 0 %
Lymphocytes Relative: 26 %
Lymphs Abs: 3 10*3/uL (ref 0.7–4.0)
MCH: 27.1 pg (ref 26.0–34.0)
MCHC: 31.2 g/dL (ref 30.0–36.0)
MCV: 86.9 fL (ref 80.0–100.0)
Monocytes Absolute: 1.2 10*3/uL — ABNORMAL HIGH (ref 0.1–1.0)
Monocytes Relative: 11 %
Neutro Abs: 7.2 10*3/uL (ref 1.7–7.7)
Neutrophils Relative %: 62 %
Platelets: 287 10*3/uL (ref 150–400)
RBC: 5.27 MIL/uL — ABNORMAL HIGH (ref 3.87–5.11)
RDW: 16.1 % — ABNORMAL HIGH (ref 11.5–15.5)
WBC: 11.7 10*3/uL — ABNORMAL HIGH (ref 4.0–10.5)
nRBC: 0 % (ref 0.0–0.2)

## 2019-09-23 LAB — GLUCOSE, CAPILLARY
Glucose-Capillary: 119 mg/dL — ABNORMAL HIGH (ref 70–99)
Glucose-Capillary: 119 mg/dL — ABNORMAL HIGH (ref 70–99)
Glucose-Capillary: 124 mg/dL — ABNORMAL HIGH (ref 70–99)
Glucose-Capillary: 120 mg/dL — ABNORMAL HIGH (ref 70–99)
Glucose-Capillary: 103 mg/dL — ABNORMAL HIGH (ref 70–99)
Glucose-Capillary: 106 mg/dL — ABNORMAL HIGH (ref 70–99)

## 2019-09-23 LAB — SODIUM
Sodium: 155 mmol/L — ABNORMAL HIGH (ref 135–145)
Sodium: 152 mmol/L — ABNORMAL HIGH (ref 135–145)
Sodium: 148 mmol/L — ABNORMAL HIGH (ref 135–145)

## 2019-09-23 MED ORDER — LOSARTAN POTASSIUM 50 MG PO TABS
25.0000 mg | ORAL_TABLET | Freq: Every day | ORAL | Status: DC
  Administered 2019-09-23 – 2019-09-27 (×5): 25 mg
  Filled 2019-09-23 (×5): qty 1

## 2019-09-23 MED ORDER — ALBUTEROL SULFATE (2.5 MG/3ML) 0.083% IN NEBU: 3.0000 mL | INHALATION_SOLUTION | RESPIRATORY_TRACT | Status: DC | PRN

## 2019-09-23 NOTE — Plan of Care (Signed)
  Problem: Self-Care: Goal: Ability to participate in self-care as condition permits will improve Outcome: Progressing   

## 2019-09-23 NOTE — Progress Notes (Signed)
Daily Progress Note   Patient Name: [name]       Date: 09/23/2019 DOB: 1934-01-24  Age: 85 y.o. MRN#: [id number] Attending Physician: Rosalin Hawking, MD Primary Care Physician: Billie Ruddy, MD Admit Date: 09/17/2019  Reason for Consultation/Follow-up: Establishing goals of care  Chart reviewed.  Spoke with ICU RN  Subjective: Patient sleeping and does not respond verbally to me this morning.  BP reads 175/130s.  I don't think this is accurate. Discussed with RN.  Hypertonic saline turned off last night. Per RN patient is A&Ox4.  Another CT planned for tomorrow.  I spoke with her daughter.  I explained that we need to wait for her sodium to come down to determine where she really is, but I suspect she will not be able to open her eyes, her left side will remain flaccid, and she will not be able to eat.  I asked her daughter to consider whether or not that is a quality of life her mother would want.  [place] meeting with family planned for tomorrow at 11:00.  I expect 4 family members to be present.   Assessment: Patient with recent lg ICH.  Not verbally responsive to me.  Sodium still significantly elevated after being treated with hypertonic saline.  CT scan indicates a decrease in midline shift from 15 to 13 to 10 mm.   Patient Profile/HPI:  85 y.o. female  with past medical history of obesity, HTN, and HLD admitted on 09/17/2019 with L facial droop, dysarthria, and L side flaccidity. CT obtained and patient found to have intracranial hemorrhage. PMT consulted for [place] per daughter's request.    Length of Stay: 6  Current Medications: Scheduled Meds:  . chlorhexidine  15 mL Mouth Rinse BID  . Chlorhexidine Gluconate Cloth  6 each Topical Daily  . feeding supplement (PRO-STAT  SUGAR FREE 64)  30 mL Per Tube BID  . mouth rinse  15 mL Mouth Rinse q12n4p  . pantoprazole (PROTONIX) IV  40 mg Intravenous QHS  . senna-docusate  1 tablet Oral BID    Continuous Infusions: . sodium chloride 75 mL/hr at 09/23/19 0600  . feeding supplement (OSMOLITE 1.2 CAL) 55 mL/hr at 09/23/19 0600    PRN Meds: acetaminophen **OR** acetaminophen (TYLENOL) oral liquid 160 mg/5 mL **OR** acetaminophen, hydrALAZINE, labetalol, lip balm,  loperamide HCl, morphine injection  Physical Exam       Well developed female, sleeping, does not wake to my voice or touch. Cor trak in place for feeding. CV rrr Resp no distress on 3L Abdomen obese  Vital Signs: BP (!) 155/69   Pulse 65   Temp 99 F (37.2 C) (Axillary)   Resp 18   Ht 5\' 3"  (1.6 m)   Wt 108.6 kg   SpO2 100%   BMI 42.41 kg/m  SpO2: SpO2: 100 % O2 Device: O2 Device: Room Air O2 Flow Rate: O2 Flow Rate (L/min): 3 L/min  Intake/output summary:   Intake/Output Summary (Last 24 hours) at 09/23/2019 0959 Last data filed at 09/23/2019 0600 Gross per 24 hour  Intake 2119.03 ml  Output 1550 ml  Net 569.03 ml   LBM: Last BM Date: 09/23/19 Baseline Weight: Weight: 111.3 kg Most recent weight: Weight: 108.6 kg       Palliative Assessment/Data: 20%     Patient Active Problem List   Diagnosis Date Noted  . Goals of care, counseling/discussion   . Palliative care by specialist   . DNR (do not resuscitate) discussion   . ICH (intracerebral hemorrhage) ([place]) 09/17/2019  . Essential hypertension 08/17/2018  . Hospital discharge follow-up 08/03/2018  . Bronchitis, asthmatic, mild intermittent, uncomplicated 0000000  . Primary osteoarthritis of both knees 05/16/2016  . Sleep disorder 08/25/2015  . Reduced vision 03/19/2015  . Seasonal allergies 11/18/2014  . Unspecified vitamin D deficiency 05/13/2014  . Asthma, chronic 08/07/2013  . Vitamin D deficiency 04/10/2012  . Right leg weakness 11/10/2011  . Multiple falls  10/31/2011  . DEGENERATIVE DISC DISEASE, LUMBOSACRAL SPINE W/RADICULOPATHY 10/07/2010  . IGT (impaired glucose tolerance) 09/07/2010  . OTHER OSTEOPOROSIS 08/22/2008  . Hyperlipidemia 02/07/2008  . Morbid obesity ([place]) 02/07/2008  . SCIATICA 01/04/2008  . Back pain 01/04/2008    Palliative Care Plan    Recommendations/Plan:  A follow up SLP would be helpful to [place] decision making.  Will request that they re-eval today or early tomorrow  Family expected for [place] meeting at 11:00 on Monday 9/28.    Goals of Care and Additional Recommendations:  Limitations on Scope of Treatment: Full Scope Treatment  Code Status:  DNR  Prognosis:   Unable to determine pending [place] conversation.   Discharge Planning:  To Be Determined  Care plan was discussed with RN, daughter.  Thank you for allowing the Palliative Medicine Team to assist in the care of this patient.  Total time spent:  35 min.     Greater than 50%  of this time was spent counseling and coordinating care related to the above assessment and plan.  Florentina Jenny, PA-C Palliative Medicine  Please contact Palliative MedicineTeam phone at [phone] for questions and concerns between 7 am - 7 pm.   Please see AMION for individual provider pager numbers.

## 2019-09-23 NOTE — Progress Notes (Signed)
STROKE TEAM PROGRESS NOTE   INTERVAL HISTORY No family at beside. Otherwise, pt neuro stable but not improving. Off 3%. Palliativemeeting with family planned for tomorrow at 11:00.     Vitals:   09/23/19 0100 09/23/19 0200 09/23/19 0300 09/23/19 0400  BP: (!) 159/83 (!) 151/90 (!) 154/78   Pulse: 75 73 65 65  Resp: (!) 22 20 (!) 21 18  Temp:    99 F (37.2 C)  TempSrc:    Axillary  SpO2: 99% 100% 99% 100%  Weight:      Height:        CBC:  Recent Labs  Lab 09/17/19 1309  09/22/19 0448 09/23/19 0320  WBC 10.4   < > 12.1* 11.7*  NEUTROABS 5.4  --   --  7.2  HGB 14.4   < > 14.1 14.3  HCT 48.3*   < > 45.0 45.8  MCV 87.7   < > 86.0 86.9  PLT 363   < > 292 287   < > = values in this interval not displayed.    Basic Metabolic Panel:  Recent Labs  Lab 09/22/19 0448  09/22/19 2142 09/23/19 0320  NA 149*   < > 156* 156*  K 3.6  --   --  3.7  CL 119*  --   --  125*  CO2 21*  --   --  23  GLUCOSE 132*  --   --  132*  BUN 16  --   --  19  CREATININE 0.75  --   --  0.82  CALCIUM 8.4*  --   --  9.1  MG 2.1  --   --   --    < > = values in this interval not displayed.   Lipid Panel:     Component Value Date/Time   CHOL 188 09/18/2019 0327   TRIG 179 (H) 09/20/2019 0610   HDL 47 09/18/2019 0327   CHOLHDL 4.0 09/18/2019 0327   VLDL UNABLE TO CALCULATE IF TRIGLYCERIDE OVER 400 mg/dL 09/18/2019 0327   LDLCALC UNABLE TO CALCULATE IF TRIGLYCERIDE OVER 400 mg/dL 09/18/2019 0327   LDLCALC 117 (H) 02/01/2018 1043   HgbA1c:  Lab Results  Component Value Date   HGBA1C 6.3 (H) 09/18/2019   Urine Drug Screen: No results found for: LABOPIA, COCAINSCRNUR, LABBENZ, AMPHETMU, THCU, LABBARB  Alcohol Level No results found for: ETH  IMAGING  Dg Abd 1 View 09/21/2019 IMPRESSION:  Feeding tube placed into descending duodenum, contrast injected to confirm placement as above.   Ct Head Wo Contrast 09/22/2019 IMPRESSION:  1. No significant interval change in size and appearance  of large right basal ganglia intraparenchymal hemorrhage with intraventricular extension. Associated regional mass effect with 10 mm of right-to-left midline shift, not significantly changed.  2. No other new acute intracranial abnormality.   Ct Head Wo Contrast 09/20/2019 IMPRESSION:  1. No significant interval change as compared to head CT 09/18/2019 performed at 1:35 a.m.  2. Similar size of a very large parenchymal hemorrhage centered within the right basal ganglia.  3. Associated mass effect with near complete effacement of the right lateral ventricle, 13 mm leftward midline shift and asymmetric prominence of the left lateral ventricle concerning for ventricular entrapment. Early uncal herniation again suspected.  4. Unchanged intraventricular extension of hemorrhage.   Dg Naso G Tube Plc W/fl-no Rad 09/21/2019 IMPRESSION:  Feeding tube placed into descending duodenum, contrast injected to confirm placement as above.   DG Port Chest - 1 View  09/22/19 IMPRESSION: Feeding tube seen entering stomach. Mild bibasilar subsegmental atelectasis.   PHYSICAL EXAM   Temp:  [98.8 F (37.1 C)-99.4 F (37.4 C)] 99 F (37.2 C) (09/27 0400) Pulse Rate:  [65-101] 65 (09/27 0400) Resp:  [18-24] 18 (09/27 0400) BP: (117-178)/(70-139) 154/78 (09/27 0300) SpO2:  [99 %-100 %] 100 % (09/27 0400)  General - obese, well developed, lethargic.  Ophthalmologic - fundi not visualized due to noncooperation.  Cardiovascular - Regular rhythm and rate.  Neuro - lethargic, eyes closed, did not open to voice or pain, however, able to answer [name], not month, knows year, in a severely dysarthic voice. Right gaze preference, did not move across midline, pupils 1.5 mm, sluggish to light, positive corneal. Severe left facial droop, tongue midline. Left hemiplegia with slight toe withdraw on pain stimulation. Right UE and LE spontaneous movement, RUE against gravity and RLE at least 2/5. Sensation subjective  intact bilaterally per patient. Coordination not cooperative. Gait not tested.   ASSESSMENT/PLAN [name] is a 85 y.o. female with history of sciatica, obesity, hypertension, hyperlipidemia and headache presenting with left facial droop, right eye gaze deviation, left-sided flaccidity and no sensation on the left side.   ICH: R large basal ganglia hemorrhage w/ IVH and SAH with cerebral edema and transfalcine herniation likely due to hypertensive etiology  Code Stroke CT head large R basal ganglia hemorrhage w/ IVH and SAH, 4 mm midline shift.     CTA head no vascular lesion seen. No ELVO.  Repeat CT head 9/22 significant increase in size of R basal ganglia ICH and IVH. Mild dilatation L lateral ventricle. Increased midline shift to 15 mm.  Carotid Doppler unremarkable  CT repeat 9/24 no significant change R basal ganglia hemorrhage 60mm midline shift, concern for ventricular entrapment and early uncal herniation. IVH stable.  CT repeat 9/26 - No significant interval change in size and appearance of large right basal ganglia intraparenchymal hemorrhage with intraventricular extension. Associated regional mass effect with 10 mm of right-to-left midline shift, not significantly changed.   2D Echo EF 65 to 70%  LDL UTC d/t TG > 400. Direct LDL 95.2  HgbA1c 6.3  SCDs for VTE prophylaxis  aspirin 81 mg daily prior to admission, now on No antithrombotic given hemorrhage   Therapy recommendations:  CIR  Disposition:  Pending  Palliative care on board, continue current care for now. If further decline, daughter will consider comfort care. Palliativemeeting with family planned for tomorrow at 11:00.   Code status - DNR  Cerebral Edema and Transfalcine Herniation  Increase of hemorrhage size since admission on repeat CT  Repeat CT no significant change R basal ganglia hemorrhage 15mm midline shift, concern for ventricular entrapment and early uncal herniation. IVH  stable.  CT repeat 9/26 - No significant interval change in size and appearance of large right basal ganglia intraparenchymal hemorrhage with intraventricular extension. Associated regional mass effect with 10 mm of right-to-left midline shift, not significantly changed.   Started on 3% protocol via PIV, drip now off -> restarted Friday - 50 cc / hr - now stopped  Na 146-154-154-153->157->159->160-156-154-151->147->149->156  NS gtt now -> 1/2NS-> NS  Goal Na 150-155  Hypertensive Emergency  BP as high as 200/110  Home meds:  Cozaar 25  On Cleviprex drip, was maximized, now off  On labetalol drip, now off   Labetalol and hydralazine PRN . SBP goal < 160 for now (SBP 151 - 159 today - monitor for now)  Headache  and right thigh pain  HA due to large right ICH  Did not pass swallow  Tylenol PR as needed  IV morphine every 2 hours as needed as per palliative care  Hyperlipidemia  Home meds:  Pravachol 10  LDL UTC as TG > 400, goal < 70. Direct LDL 95.2  Statin on hold given ICH  Continue statin at discharge  Dysphagia . Secondary to stroke . Did not pass swallow . NPO . Place Cortrak and start TF . Speech on board . On IV fluid . dtr does not think she would want PEG   Other Stroke Risk Factors  Advanced age  Former Cigarette smoker, quit 25 yrs ago  Morbid Obesity, Body mass index is 42.41 kg/m., recommend weight loss, diet and exercise as appropriate   Family hx stroke (father, brother)  Other Active Problems  Leukocytosis 10.4-14.5-17.9-14.3-12.4->12.1->11.7 - likely reactive.  CXR ok.    Fever - 100.2 (ax)->99 (ax) - repeat CXR (Mild bibasilar subsegmental atelectasis) ; U/A & Culture (U/A - neg ; Culture - pending) ; Blood Cultures - (pending)  Hypokalemia - 3.1 - repleated ->3.6->3.7  Dr Rory Percy saw pt early this AM for multiple loose stools - consider C-diff if liquid. (Cdiff - negative)  Diarrhea may be secondary to Osmolite tube feeding  supplement  Hospital day # 57   Palliativemeeting with family planned for tomorrow at 11:00. Pending repeat CT head in the morning.  This patient is critically ill due to large right ICH, IVH, hypertensive urgency, cerebral edema, dysphagia and at significant risk of neurological worsening, death form hematoma expansion, brain herniation, seizure. This patient's care requires constant monitoring of vital signs, hemodynamics, respiratory and cardiac monitoring, review of multiple databases, neurological assessment, discussion with family, other specialists and medical decision making of high complexity. I spent 30 minutes of neurocritical care time in the care of this patient.   Personally examined patient and images, and have participated in and made any corrections needed to history, physical, neuro exam,assessment and plan as stated above.  I have personally obtained the history, evaluated lab date, reviewed imaging studies and agree with radiology interpretations.      To contact Stroke Continuity provider, please refer to [email or web address] After hours, contact General Neurology

## 2019-09-24 ENCOUNTER — Inpatient Hospital Stay (HOSPITAL_COMMUNITY): Payer: Medicare Other

## 2019-09-24 DIAGNOSIS — I161 Hypertensive emergency: Secondary | ICD-10-CM | POA: Diagnosis present

## 2019-09-24 LAB — GLUCOSE, CAPILLARY
Glucose-Capillary: 136 mg/dL — ABNORMAL HIGH (ref 70–99)
Glucose-Capillary: 120 mg/dL — ABNORMAL HIGH (ref 70–99)
Glucose-Capillary: 107 mg/dL — ABNORMAL HIGH (ref 70–99)
Glucose-Capillary: 98 mg/dL (ref 70–99)

## 2019-09-24 LAB — BASIC METABOLIC PANEL
Anion gap: 8 (ref 5–15)
BUN: 18 mg/dL (ref 8–23)
CO2: 21 mmol/L — ABNORMAL LOW (ref 22–32)
Calcium: 8.8 mg/dL — ABNORMAL LOW (ref 8.9–10.3)
Chloride: 120 mmol/L — ABNORMAL HIGH (ref 98–111)
Creatinine, Ser: 0.59 mg/dL (ref 0.44–1.00)
GFR calc Af Amer: >60 mL/min (ref >60)
GFR calc non Af Amer: >60 mL/min (ref >60)
Glucose, Bld: 118 mg/dL — ABNORMAL HIGH (ref 70–99)
Potassium: 4.8 mmol/L (ref 3.5–5.1)
Sodium: 149 mmol/L — ABNORMAL HIGH (ref 135–145)

## 2019-09-24 LAB — CBC WITH DIFFERENTIAL/PLATELET
Abs Immature Granulocytes: 0.06 10*3/uL (ref 0.00–0.07)
Basophils Absolute: 0.1 10*3/uL (ref 0.0–0.1)
Basophils Relative: 1 %
Eosinophils Absolute: 0.3 10*3/uL (ref 0.0–0.5)
Eosinophils Relative: 2 %
HCT: 45 % (ref 36.0–46.0)
Hemoglobin: 13.6 g/dL (ref 12.0–15.0)
Immature Granulocytes: 0 %
Lymphocytes Relative: 25 %
Lymphs Abs: 3.8 10*3/uL (ref 0.7–4.0)
MCH: 26.4 pg (ref 26.0–34.0)
MCHC: 30.2 g/dL (ref 30.0–36.0)
MCV: 87.4 fL (ref 80.0–100.0)
Monocytes Absolute: 1.5 10*3/uL — ABNORMAL HIGH (ref 0.1–1.0)
Monocytes Relative: 10 %
Neutro Abs: 9.6 10*3/uL — ABNORMAL HIGH (ref 1.7–7.7)
Neutrophils Relative %: 62 %
Platelets: 297 10*3/uL (ref 150–400)
RBC: 5.15 MIL/uL — ABNORMAL HIGH (ref 3.87–5.11)
RDW: 16 % — ABNORMAL HIGH (ref 11.5–15.5)
WBC: 15.3 10*3/uL — ABNORMAL HIGH (ref 4.0–10.5)
nRBC: 0 % (ref 0.0–0.2)

## 2019-09-24 LAB — SODIUM: Sodium: 147 mmol/L — ABNORMAL HIGH (ref 135–145)

## 2019-09-24 MED ORDER — PANTOPRAZOLE SODIUM 40 MG PO PACK
40.0000 mg | PACK | Freq: Every day | ORAL | Status: DC
  Administered 2019-09-24 – 2019-09-26 (×3): 40 mg
  Filled 2019-09-24 (×3): qty 20

## 2019-09-24 NOTE — Progress Notes (Signed)
[name] [id number] Admission Data: 09/24/2019 5:05 PM Attending Provider: Garvin Fila, MD  [id number], Langley Adie, MD Consults/ Treatment Team:   [name] is a 85 y.o. female patient admitted from ED awake, alert  & orientated  X 3,  DNR, VSS - Blood pressure (!) 147/71, pulse 65, temperature 100.1 F (37.8 C), temperature source Axillary, resp. rate 17, height 5\' 3"  (1.6 m), weight 109.4 kg, SpO2 100 %., room air, no c/o shortness of breath, no c/o chest pain, no distress noted.    IV site WDL:  hand right, condition patent and no redness, antecubital left, condition patent and no redness and upper arm left, condition patent and no redness with a transparent dsg that's clean dry and intact.  Cortrak Tube: left nare, receiving Osmolite 1.2 Cal 65ml/hr  Allergies:   Allergies  Allergen Reactions  . Amlodipine     Headache   . Oxycodone Nausea And Vomiting  . Penicillins Swelling  . Pravastatin Other (See Comments)    Gives pt headaches  . Tramadol Other (See Comments)    Pt "unsure"     Past Medical History:  Diagnosis Date  . Asthma   . Back pain   . Depression   . Family history of arthritis   . Family history of diabetes mellitus   . Family history of ischemic heart disease   . Headache(784.0)   . High blood pressure   . High cholesterol   . Hyperlipidemia   . Hypertension   . Obesity   . Pain from implanted hardware 01/28/2012  . Personal history of unspecified circulatory disease   . Sciatica   . Shortness of breath   . Urinary incontinence     Pt orientation to unit, room and routine. Information packet given to patient/family and safety video watched.  Admission INP armband ID verified with patient/family, and in place. SR up x 2, fall risk assessment complete with Patient and family verbalizing understanding of risks associated with falls. Pt verbalizes an understanding of how to use the call bell and to call for help before getting out of bed.   Skin tear on right forearm, MASD present on groin area.    Will cont to monitor and assist as needed.  Walker Shadow, RN 09/24/2019 5:05 PM

## 2019-09-24 NOTE — Progress Notes (Signed)
Physical Therapy Treatment Patient Details Name: [name] MRN: [id number] DOB: Oct 13, 1934 Today's Date: 09/24/2019    History of Present Illness Pt is an 85 yo female admitted with L facial droop, dysarthria, and L side flaccid. Pt found to have R basal ganglia hemorrhage with IVH and SAH with cerebral edema and transfalcine herniation likely due to hypertensive urgency inital 65mm midline shift.  Pt with significant PMH of obesity, HTN, back pain s/p spine surgery, R knee surgery after fx.      PT Comments    Pt with consistent command follow with R UE and LE. Pt remains unable to open eyes due to stroke. When patients eyes are opened by the therapist they are in a R fixed gaze. Pt cont to require total Ax2 for EOB mobility and to maintain balance at EOB. Acute PT to cont to follow and progress OOB mobility.    Follow Up Recommendations  SNF     Equipment Recommendations  Wheelchair (measurements PT);Wheelchair cushion (measurements PT);Hospital bed;Other (comment)    Recommendations for Other Services       Precautions / Restrictions Precautions Precautions: Fall Precaution Comments: SBP goal <160 Restrictions Weight Bearing Restrictions: No    Mobility  Bed Mobility Overal bed mobility: Needs Assistance Bed Mobility: Supine to Sit;Sit to Supine     Supine to sit: Total assist;+2 for physical assistance Sit to supine: Total assist;+2 for physical assistance   General bed mobility comments: HOB elevated, pt with no physical assist during transfers requiring total assist with trunk and LEs  Transfers                 General transfer comment: will need maxy sky for OOB transfer  Ambulation/Gait                 Stairs             Wheelchair Mobility    Modified Rankin (Stroke Patients Only) Modified Rankin (Stroke Patients Only) Pre-Morbid Rankin Score: No symptoms Modified Rankin: Severe disability     Balance Overall balance  assessment: Needs assistance Sitting-balance support: Feet unsupported;Single extremity supported Sitting balance-Leahy Scale: Zero Sitting balance - Comments: maxA posteriorly, no attempts to self correct posterior falling, tolerated sitting EOB x 10 min                                    Cognition Arousal/Alertness: Lethargic Behavior During Therapy: Flat affect Overall Cognitive Status: Impaired/Different from baseline Area of Impairment: Orientation;Memory;Attention;Following commands;Safety/judgement;Awareness;Problem solving                 Orientation Level: (stated name and hospital) Current Attention Level: Focused Memory: Decreased short-term memory Following Commands: Follows one step commands inconsistently;Follows one step commands with increased time Safety/Judgement: Decreased awareness of safety;Decreased awareness of deficits Awareness: Intellectual Problem Solving: Slow processing;Decreased initiation;Difficulty sequencing;Requires verbal cues;Requires tactile cues General Comments: pt following commands consistenly with R UE and LE, no L UE/LE movement      Exercises      General Comments General comments (skin integrity, edema, etc.): VSS      Pertinent Vitals/Pain Pain Assessment: Faces Faces Pain Scale: No hurt    Home Living                      Prior Function            PT Goals (current goals  can now be found in the care plan section) Progress towards PT goals: Progressing toward goals    Frequency    Min 3X/week      PT Plan Discharge plan needs to be updated    Co-evaluation              AM-PAC PT "6 Clicks" Mobility   Outcome Measure  Help needed turning from your back to your side while in a flat bed without using bedrails?: Total Help needed moving from lying on your back to sitting on the side of a flat bed without using bedrails?: Total Help needed moving to and from a bed to a chair  (including a wheelchair)?: Total Help needed standing up from a chair using your arms (e.g., wheelchair or bedside chair)?: Total Help needed to walk in hospital room?: Total Help needed climbing 3-5 steps with a railing? : Total 6 Click Score: 6    End of Session Equipment Utilized During Treatment: Oxygen Activity Tolerance: Patient limited by lethargy Patient left: in bed;with call bell/phone within reach;with bed alarm set Nurse Communication: Mobility status PT Visit Diagnosis: Muscle weakness (generalized) (M62.81);Difficulty in walking, not elsewhere classified (R26.2);Hemiplegia and hemiparesis Hemiplegia - Right/Left: Left Hemiplegia - dominant/non-dominant: Non-dominant Hemiplegia - caused by: Nontraumatic intracerebral hemorrhage     Time: 1033-1056 PT Time Calculation (min) (ACUTE ONLY): 23 min  Charges:  $Therapeutic Activity: 8-22 mins $Neuromuscular Re-education: 8-22 mins                     Kittie Plater, PT, DPT Acute Rehabilitation Services Pager #: [phone] Office #: [phone]    Berline Lopes 09/24/2019, 12:22 PM

## 2019-09-24 NOTE — Progress Notes (Signed)
During shift assessment, patient drowsy and does not open eyes. When RN asked if patient in pain, patient replied "uh uh," indicating no. Patient moves right arm and leg with purposeful movement. Patient does not move left arm nor left leg with any purposeful movement.   2327 when asked if pain/hurting stated uh uh indicating "no". Moves right side purposefully to attempt reposition herself. Will not open eyes .  During reassessment at 0420 asked patient if wanted to wash her face, patient reply "uh huh" indicating yes.  When RN informed patient she will be taking her temperature and inquired if patient could hear her, patient replied "uh huh."

## 2019-09-24 NOTE — Progress Notes (Signed)
Daily Progress Note   Patient Name: [name]       Date: 09/24/2019 DOB: 1934/09/12  Age: 85 y.o. MRN#: [id number] Attending Physician: Garvin Fila, MD Primary Care Physician: Billie Ruddy, MD Admit Date: 09/17/2019  Reason for Consultation/Follow-up: Establishing goals of care and Psychosocial/spiritual support  Subjective: Patient follows commands (thumbs up) with PT. Per RN she is orientated.  Each time I see Ms. Threat "[name]" she is in a sound sleep.   Family meeting with [name] (dtr), [name]), [name] (brother).  Added patient's twin, [name], on by phone.  Talked about prognosis.  She is unlikely to walk, eat, or see again.  Discussed two paths:  PEG/SNF vs Comfort care.  Described each in detail.  Family unable to make decisions at this point.  We are just taking it "day by day".  They would like input from the patient but are concerned she will not understand.  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  She was walking/talking/grocery shopping and living independently prior to admission.  She is a religious woman who believes in everlasting life.   Assessment: Large ICH.  CT scan from 9/28 shows bleed / edema is now stable (but not receding).  Patient unable to use left side, unable to eat, unable to open her eyes, she sleeps most of the time.  Family is struggling to decide between SNF/PEG vs Comfort.    WBC is up today and her temperature is 100 (f).   Patient Profile/HPI:   85 y.o. female  with past medical history of obesity, HTN, and HLD admitted on 09/17/2019 with L facial droop, dysarthria, and L side flaccidity. CT obtained and patient found to have intracranial hemorrhage. PMT consulted for [place] per daughter's request.      Length of Stay: 7  Current Medications: Scheduled Meds:  . chlorhexidine  15 mL Mouth Rinse BID  . Chlorhexidine Gluconate Cloth  6 each Topical Daily  . feeding supplement (PRO-STAT SUGAR FREE 64)  30 mL Per Tube BID  . losartan  25 mg Per Tube Daily  . mouth rinse  15 mL Mouth Rinse q12n4p  . pantoprazole sodium  40 mg Per Tube QHS  . senna-docusate  1 tablet Oral BID    Continuous Infusions: . sodium  chloride 75 mL/hr at 09/24/19 0700  . feeding supplement (OSMOLITE 1.2 CAL) 55 mL/hr at 09/24/19 1346    PRN Meds: acetaminophen **OR** acetaminophen (TYLENOL) oral liquid 160 mg/5 mL **OR** acetaminophen, albuterol, hydrALAZINE, labetalol, lip balm, loperamide HCl, morphine injection  Physical Exam       Well developed female, eyes closed, she will attempt to speak in answer to my questions but is only able to make a sound CV rrr resp no distress on 3L Left side flaccid   Vital Signs: BP 136/78   Pulse 65   Temp 100.1 F (37.8 C) (Axillary)   Resp 18   Ht 5\' 3"  (1.6 m)   Wt 109.4 kg   SpO2 93%   BMI 42.72 kg/m  SpO2: SpO2: 93 % O2 Device: O2 Device: Room Air O2 Flow Rate: O2 Flow Rate (L/min): 3 L/min  Intake/output summary:   Intake/Output Summary (Last 24 hours) at 09/24/2019 1445 Last data filed at 09/24/2019 0800 Gross per 24 hour  Intake 2400.17 ml  Output 2250 ml  Net 150.17 ml   LBM: Last BM Date: 09/24/19 Baseline Weight: Weight: 111.3 kg Most recent weight: Weight: 109.4 kg       Palliative Assessment/Data: 10%    Flowsheet Rows     Most Recent Value  Intake Tab  Referral Department  Neurology  Unit at Time of Referral  ICU  Palliative Care Primary Diagnosis  Neurology  Date Notified  09/18/19  Palliative Care Type  New Palliative care  Reason for referral  Clarify Goals of Care  Date of Admission  09/17/19  Date first seen by Palliative Care  09/18/19  # of days Palliative referral response time  0 Day(s)  # of days IP prior to  Palliative referral  1  Clinical Assessment  Palliative Performance Scale Score  10%  Psychosocial & Spiritual Assessment  Palliative Care Outcomes  Patient/Family meeting held?  Yes  Who was at the meeting?  daughter  [name] goals of care, Provided advance care planning, Provided psychosocial or spiritual support, Changed CPR status      Patient Active Problem List   Diagnosis Date Noted  . Dysphagia   . Goals of care, counseling/discussion   . Palliative care by specialist   . DNR (do not resuscitate) discussion   . ICH (intracerebral hemorrhage) ([place]) 09/17/2019  . Essential hypertension 08/17/2018  . Hospital discharge follow-up 08/03/2018  . Bronchitis, asthmatic, mild intermittent, uncomplicated 0000000  . Primary osteoarthritis of both knees 05/16/2016  . Sleep disorder 08/25/2015  . Reduced vision 03/19/2015  . Seasonal allergies 11/18/2014  . Unspecified vitamin D deficiency 05/13/2014  . Asthma, chronic 08/07/2013  . Vitamin D deficiency 04/10/2012  . Right leg weakness 11/10/2011  . Multiple falls 10/31/2011  . DEGENERATIVE DISC DISEASE, LUMBOSACRAL SPINE W/RADICULOPATHY 10/07/2010  . IGT (impaired glucose tolerance) 09/07/2010  . OTHER OSTEOPOROSIS 08/22/2008  . Hyperlipidemia 02/07/2008  . Morbid obesity ([place]) 02/07/2008  . SCIATICA 01/04/2008  . Back pain 01/04/2008    Palliative Care Plan    Recommendations/Plan:  PMT GOC for 9/29 at 2:00 pm.   I feel this family needs more time.  Continue current full scope treatment.  Her daughter is a Engineer, manufacturing on [id number].  Her niece is an Glass blower/designer at [employer] and Additional Recommendations:  Limitations on Scope of Treatment: Full Scope Treatment  Code Status:  DNR  Prognosis:   Unable to determine.  At  high risk for acute decline from aspiration or stroke.   Discharge Planning:  To Be Determined  Care plan was discussed with family and RN  Thank  you for allowing the Palliative Medicine Team to assist in the care of this patient.  Total time spent:  60 min.     Greater than 50%  of this time was spent counseling and coordinating care related to the above assessment and plan.  Florentina Jenny, PA-C Palliative Medicine  Please contact Palliative MedicineTeam phone at [phone] for questions and concerns between 7 am - 7 pm.   Please see AMION for individual provider pager numbers.

## 2019-09-24 NOTE — Progress Notes (Signed)
Chaplain responded to a STAT request for consult in reference to a POA. [name]'s grandson has paperwork to become the pt's financial POA. Chaplain spoke with Kieth Brightly our notary and we are working on completing this notary request for [employer]. Chaplain remains available per request.  Chaplain Resident, Evelene Croon, [place] Pager # [phone] personal Pager # [phone] on-call Office # [phone]

## 2019-09-24 NOTE — Progress Notes (Signed)
STROKE TEAM PROGRESS NOTE   INTERVAL HISTORY Patient remains sleepy but can be aroused and is able to speak with dysarthric speech and has left hemiparesis.  Blood pressure is adequately controlled.  She had low-grade fever and slight elevated white count this morning.  Repeat CT scan of the head this morning unchanged lobar hemorrhage and IVH w/ edema and mass effect  Vitals:   09/24/19 0800 09/24/19 0900 09/24/19 1100 09/24/19 1200  BP: (!) 148/79 140/71 136/78   Pulse: 65     Resp: 18     Temp: 99.1 F (37.3 C)   100.1 F (37.8 C)  TempSrc: Axillary   Axillary  SpO2: 93%     Weight:      Height:        CBC:  Recent Labs  Lab 09/23/19 0320 09/24/19 0443  WBC 11.7* 15.3*  NEUTROABS 7.2 9.6*  HGB 14.3 13.6  HCT 45.8 45.0  MCV 86.9 87.4  PLT 287 [id number]    Basic Metabolic Panel:  Recent Labs  Lab 09/22/19 0448  09/23/19 0320  09/24/19 0443 09/24/19 1013  NA 149*   < > 156*   < > 149* 147*  K 3.6  --  3.7  --  4.8  --   CL 119*  --  125*  --  120*  --   CO2 21*  --  23  --  21*  --   GLUCOSE 132*  --  132*  --  118*  --   BUN 16  --  19  --  18  --   CREATININE 0.75  --  0.82  --  0.59  --   CALCIUM 8.4*  --  9.1  --  8.8*  --   MG 2.1  --   --   --   --   --    < > = values in this interval not displayed.   IMAGING past 24h Ct Head Wo Contrast  Result Date: 09/24/2019 CLINICAL DATA:  Follow-up intracranial hemorrhage EXAM: CT HEAD WITHOUT CONTRAST TECHNIQUE: Contiguous axial images were obtained from the base of the skull through the vertex without intravenous contrast. COMPARISON:  Two days ago FINDINGS: Brain: Extensive hemorrhage within the right cerebral hemisphere with dominant putaminal based hematoma measuring 5 x 7 x 4.6 cm, stable from prior. Patchy hemorrhage along the right caudate nucleus and intervening white matter is also unchanged. Regional edema and mass effect is unchanged. Due to head obliquity midline shift is measured on coronal reformats at 5 mm.  Mild intraventricular hemorrhage within the occipital horns of the lateral ventricles with stable ventricular volume. No new site of hemorrhage. No acute ischemia. Vascular: Negative Skull: No acute finding Sinuses/Orbits: No acute finding IMPRESSION: Unchanged lobar and intraventricular hemorrhage with edema and mass effect. Electronically Signed   By: Monte Fantasia [name]   On: 09/24/2019 06:10   PHYSICAL EXAM   General - obese, elderly African-American lady, lethargic.  Ophthalmologic - fundi not visualized due to noncooperation.  Cardiovascular - Regular rhythm and rate.  Neurological Exam : - lethargic, eyes closed, did open to voice and pain, however, able to answer her name, not month, knows year, in a severely dysarthic voice. Right gaze preference, did not move across midline, pupils 1.5 mm, sluggish to light, positive corneal. Severe left facial droop, tongue midline. Left hemiplegia with slight toe withdraw on pain stimulation. Right UE and LE spontaneous movement, RUE against gravity and RLE at least 2/5. Sensation subjective  intact bilaterally per patient. Coordination not cooperative. Gait not tested.   ASSESSMENT/PLAN [name] is a 85 y.o. female with history of sciatica, obesity, hypertension, hyperlipidemia and headache presenting with left facial droop, right eye gaze deviation, left-sided flaccidity and no sensation on the left side.   ICH: R large basal ganglia hemorrhage w/ IVH and SAH with cerebral edema and transfalcine herniation likely due to hypertensive etiology  Code Stroke CT head large R basal ganglia hemorrhage w/ IVH and SAH, 4 mm midline shift.     CTA head no vascular lesion seen. No ELVO.  Repeat CT head 9/22 significant increase in size of R basal ganglia ICH and IVH. Mild dilatation L lateral ventricle. Increased midline shift to 15 mm.  Carotid Doppler unremarkable  CT repeat 9/24 no significant change R basal ganglia hemorrhage 93mm midline  shift, concern for ventricular entrapment and early uncal herniation. IVH stable.  CT repeat 9/26 - No significant interval change in size and appearance of large right basal ganglia intraparenchymal hemorrhage with intraventricular extension. Associated regional mass effect with 10 mm of right-to-left midline shift, not significantly changed.   CT repeat 9/28 unchanged lobar hemorrhage and IVH w/ edema and mass effect  2D Echo EF 65 to 70%  LDL UTC d/t TG > 400. Direct LDL 95.2  HgbA1c 6.3  SCDs for VTE prophylaxis  aspirin 81 mg daily prior to admission, now on No antithrombotic given hemorrhage   Therapy recommendations:  SNF  Disposition:  Pending  Palliative care on board, continue current care for now. If further decline, daughter will consider comfort care. Palliative meeting with family planned for today at 11:00.   Code status - DNR  Cerebral Edema and Transfalcine Herniation  Increase of hemorrhage size since admission on repeat CT  Repeat CT no significant change R basal ganglia hemorrhage 54mm midline shift, concern for ventricular entrapment and early uncal herniation. IVH stable.  CT repeat 9/26 - No significant interval change in size and appearance of large right basal ganglia intraparenchymal hemorrhage with intraventricular extension. Associated regional mass effect with 10 mm of right-to-left midline shift, not significantly changed.   CT repeat 9/28 unchanged lobar hemorrhage and IVH w/ edema and mass effect  Started on 3% protocol via PIV, drip now off -> restarted Friday - 50 cc / hr - now stopped  Na 147  NS gtt now -> 1/2NS-> NS  Hypertensive Emergency  BP as high as 200/110  Home meds:  Cozaar 25  On Cleviprex drip, was maximized, now off  On labetalol drip, now off   Labetalol and hydralazine PRN . SBP goal < 160   Headache and right thigh pain  HA due to large right ICH  Did not pass swallow  Tylenol PR as needed  IV morphine  every 2 hours as needed as per palliative care  Hyperlipidemia  Home meds:  Pravachol 10  LDL Direct LDL 95.2, TG > 400, goal < 70.   Statin on hold given ICH  Continue statin at discharge  Dysphagia . Secondary to stroke . Did not pass swallow . NPO . Place Cortrak and start TF . Speech on board . On IV fluid . dtr does not think she would want PEG   Other Stroke Risk Factors  Advanced age  Former Cigarette smoker, quit 25 yrs ago  Morbid Obesity, Body mass index is 42.72 kg/m., recommend weight loss, diet and exercise as appropriate   Family hx stroke (father, brother)  Other Active Problems  Leukocytosis 15.3 - likely reactive.  CXR ok.    Fever - 100.1 CXR (Mild bibasilar subsegmental atelectasis) ; U/A & Culture (U/A - neg ; Culture - insign growth) ; Blood Cultures - (no growth 2d)  Hypokalemia, resolved  multiple loose stools - C-diff  Negative. may be secondary to Osmolite tube feeding supplement  Hospital day # 7  Patient has had a large basal ganglia hemorrhage and remains sleepy but is able to follow commands and speaking and dysarthric voice.  Continue supportive care with strict blood pressure control and tube feeding.  Long discussion at bedside with the patient daughter as well as grandson and answered questions about the prognosis and plan of care.  Clearly the family does not want heroic measures and agreed to DNR but will like to continue supportive care for the next few days and hopefully she improves to be able to swallow.  They appear to be reluctant to consider PEG tube and prolonged nursing home care.  Appreciate palliative care team consultation will continue to follow.  Repeat WBC count tomorrow morning and if patient spikes fever may consider antibiotics plan to transfer the patient to neurology floor bed later today This patient is critically ill and at significant risk of neurological worsening, death and care requires constant monitoring of  vital signs, hemodynamics,respiratory and cardiac monitoring, extensive review of multiple databases, frequent neurological assessment, discussion with family, other specialists and medical decision making of high complexity.I have made any additions or clarifications directly to the above note.This critical care time does not reflect procedure time, or teaching time or supervisory time of PA/NP/Med Resident etc but could involve care discussion time.  I spent 30 minutes of neurocritical care time  in the care of  this patient.   Antony Contras, MD    To contact Stroke Continuity provider, please refer to [email or web address] After hours, contact General Neurology

## 2019-09-24 NOTE — Progress Notes (Signed)
Patient transferred to [phone] with no adverse events. [name] and POA called and made aware.

## 2019-09-25 ENCOUNTER — Inpatient Hospital Stay (HOSPITAL_COMMUNITY): Payer: Medicare Other

## 2019-09-25 LAB — CBC WITH DIFFERENTIAL/PLATELET
Abs Immature Granulocytes: 0.06 10*3/uL (ref 0.00–0.07)
Basophils Absolute: 0.1 10*3/uL (ref 0.0–0.1)
Basophils Relative: 1 %
Eosinophils Absolute: 0.3 10*3/uL (ref 0.0–0.5)
Eosinophils Relative: 2 %
HCT: 41.7 % (ref 36.0–46.0)
Hemoglobin: 13.2 g/dL (ref 12.0–15.0)
Immature Granulocytes: 0 %
Lymphocytes Relative: 23 %
Lymphs Abs: 3.4 10*3/uL (ref 0.7–4.0)
MCH: 27 pg (ref 26.0–34.0)
MCHC: 31.7 g/dL (ref 30.0–36.0)
MCV: 85.5 fL (ref 80.0–100.0)
Monocytes Absolute: 1.6 10*3/uL — ABNORMAL HIGH (ref 0.1–1.0)
Monocytes Relative: 11 %
Neutro Abs: 9.5 10*3/uL — ABNORMAL HIGH (ref 1.7–7.7)
Neutrophils Relative %: 63 %
Platelets: 276 10*3/uL (ref 150–400)
RBC: 4.88 MIL/uL (ref 3.87–5.11)
RDW: 15.7 % — ABNORMAL HIGH (ref 11.5–15.5)
WBC: 15 10*3/uL — ABNORMAL HIGH (ref 4.0–10.5)
nRBC: 0 % (ref 0.0–0.2)

## 2019-09-25 LAB — BASIC METABOLIC PANEL
Anion gap: 11 (ref 5–15)
BUN: 19 mg/dL (ref 8–23)
CO2: 22 mmol/L (ref 22–32)
Calcium: 8.9 mg/dL (ref 8.9–10.3)
Chloride: 112 mmol/L — ABNORMAL HIGH (ref 98–111)
Creatinine, Ser: 0.66 mg/dL (ref 0.44–1.00)
GFR calc Af Amer: >60 mL/min (ref >60)
GFR calc non Af Amer: >60 mL/min (ref >60)
Glucose, Bld: 137 mg/dL — ABNORMAL HIGH (ref 70–99)
Potassium: 3.8 mmol/L (ref 3.5–5.1)
Sodium: 145 mmol/L (ref 135–145)

## 2019-09-25 LAB — GLUCOSE, CAPILLARY
Glucose-Capillary: 105 mg/dL — ABNORMAL HIGH (ref 70–99)
Glucose-Capillary: 126 mg/dL — ABNORMAL HIGH (ref 70–99)
Glucose-Capillary: 142 mg/dL — ABNORMAL HIGH (ref 70–99)
Glucose-Capillary: 143 mg/dL — ABNORMAL HIGH (ref 70–99)
Glucose-Capillary: 145 mg/dL — ABNORMAL HIGH (ref 70–99)

## 2019-09-25 NOTE — Progress Notes (Signed)
STROKE TEAM PROGRESS NOTE   INTERVAL HISTORY Patient's mental status and general medical condition seems to be declining gradually over the last few days.  She has remained afebrile but her white count has gone up to 15.  She has been poorly responsive and not speaking following commands consistently. A repeat stat CT scan of the head shows slight decrease in right hemispheric parenchymal hemorrhage though there is still persistent edema and mass-effect and right-to-left shift which is unchanged. Vitals:   09/25/19 0300 09/25/19 0419 09/25/19 0500 09/25/19 0546  BP: (!) 181/83 124/79  (!) 160/77  Pulse: 85 68    Resp:      Temp:  98.8 F (37.1 C)    TempSrc:  Axillary    SpO2: 93% 95%  94%  Weight:   114.5 kg   Height:        CBC:  Recent Labs  Lab 09/24/19 0443 09/25/19 0256  WBC 15.3* 15.0*  NEUTROABS 9.6* 9.5*  HGB 13.6 13.2  HCT 45.0 41.7  MCV 87.4 85.5  PLT 297 [id number]    Basic Metabolic Panel:  Recent Labs  Lab 09/22/19 0448  09/24/19 0443 09/24/19 1013 09/25/19 0256  NA 149*   < > 149* 147* 145  K 3.6   < > 4.8  --  3.8  CL 119*   < > 120*  --  112*  CO2 21*   < > 21*  --  22  GLUCOSE 132*   < > 118*  --  137*  BUN 16   < > 18  --  19  CREATININE 0.75   < > 0.59  --  0.66  CALCIUM 8.4*   < > 8.8*  --  8.9  MG 2.1  --   --   --   --    < > = values in this interval not displayed.   IMAGING past 24h No results found.  PHYSICAL EXAM    General - obese, elderly African-American lady, lethargic.  Ophthalmologic - fundi not visualized due to noncooperation.  Cardiovascular - Regular rhythm and rate.  Neurological Exam : - lethargic, eyes closed, did open to voice and pain, however, unable to answer her name,  Month,year,.  Follows only minimum occasional commands.  Right gaze preference, did not move across midline, pupils 1.5 mm, sluggish to light, positive corneal. Severe left facial droop, tongue midline. Left hemiplegia with slight toe withdraw on pain  stimulation. Right UE and LE spontaneous movement, RUE against gravity and RLE at least 2/5. Sensation subjective intact bilaterally per patient. Coordination not cooperative. Gait not tested.   ASSESSMENT/PLAN Ms. CHERICA VERBURG is a 85 y.o. female with history of sciatica, obesity, hypertension, hyperlipidemia and headache presenting with left facial droop, right eye gaze deviation, left-sided flaccidity and no sensation on the left side.   ICH: R large basal ganglia hemorrhage w/ IVH and SAH with cerebral edema and transfalcine herniation likely due to hypertensive etiology  Code Stroke CT head large R basal ganglia hemorrhage w/ IVH and SAH, 4 mm midline shift.     CTA head no vascular lesion seen. No ELVO.  Repeat CT head 9/22 significant increase in size of R basal ganglia ICH and IVH. Mild dilatation L lateral ventricle. Increased midline shift to 15 mm.  Carotid Doppler unremarkable  CT repeat 9/24 no significant change R basal ganglia hemorrhage 35mm midline shift, concern for ventricular entrapment and early uncal herniation. IVH stable.  CT repeat 9/26 - No  significant interval change in size and appearance of large right basal ganglia intraparenchymal hemorrhage with intraventricular extension. Associated regional mass effect with 10 mm of right-to-left midline shift, not significantly changed.   CT repeat 9/28 unchanged lobar hemorrhage and IVH w/ edema and mass effect  Neuro worse today. Repeat CT head 9/29 repeat stat CT scan of the head  shows slight decrease in right hemispheric parenchymal hemorrhage though there is still persistent edema and mass-effect and right-to-left shift which is unchanged.  2D Echo EF 65 to 70%  LDL UTC d/t TG > 400. Direct LDL 95.2  HgbA1c 6.3  SCDs for VTE prophylaxis  aspirin 81 mg daily prior to admission, now on No antithrombotic given hemorrhage   Therapy recommendations:  SNF  Disposition:  Pending  Palliative care on board,  following up with family today again as family needed more time     Code status - DNR  Cerebral Edema and Transfalcine Herniation  Increase of hemorrhage size since admission on repeat CT  Repeat CT no significant change R basal ganglia hemorrhage 86mm midline shift, concern for ventricular entrapment and early uncal herniation. IVH stable.  CT repeat 9/26 - No significant interval change in size and appearance of large right basal ganglia intraparenchymal hemorrhage with intraventricular extension. Associated regional mass effect with 10 mm of right-to-left midline shift, not significantly changed.   CT repeat 9/28 unchanged lobar hemorrhage and IVH w/ edema and mass effect  Started on 3% protocol via PIV, drip now off -> restarted Friday - 50 cc / hr - now stopped  Na 145  NS gtt now -> 1/2NS-> NS  Hypertensive Emergency  BP as high as 200/110  Home meds:  Cozaar 25  On Cleviprex drip, was maximized, now off  On labetalol drip, now off   Labetalol and hydralazine PRN . SBP goal < 160   Headache and right thigh pain  HA due to large right ICH  Did not pass swallow  Tylenol PR as needed  IV morphine every 2 hours as needed as per palliative care  Hyperlipidemia  Home meds:  Pravachol 10  LDL Direct LDL 95.2, TG > 400, goal < 70.   Statin on hold given ICH  Continue statin at discharge  Dysphagia . Secondary to stroke . Did not pass swallow . NPO . On Cortrak and TF . Speech on board . On IV fluid . dtr does not think she would want PEG   Possible Aspiration PNA  Other Stroke Risk Factors  Advanced age  Former Cigarette smoker, quit 25 yrs ago  Morbid Obesity, Body mass index is 44.72 kg/m., recommend weight loss, diet and exercise as appropriate   Family hx stroke (father, brother)  Other Active Problems  Leukocytosis 15.0 - likely reactive.  CXR ok.    Fever - 100.1 CXR (Mild bibasilar subsegmental atelectasis) ; U/A & Culture (U/A - neg  ; Culture - insign growth) ; Blood Cultures - (no growth 3d)  Hypokalemia, resolved  multiple loose stools - C-diff  Negative. may be secondary to Osmolite tube feeding supplement  Hospital day # 8  Patient has shown neurological worsening today with CT scan showing decrease in hematoma size but persistent cytotoxic edema and midline shift.  I suspect she may be developing some aspiration pneumonia.  Patient is DNR but the prognosis for surviving this and making meaningful neurological recovery is quite poor.  Palliative care team plan to discuss with family goals of care this afternoon and  hopefully may succeed in making patient comfort care measures only.  Continue ongoing management till then.  Discussed with palliative care team nurse practitioner.  Greater than 50% time during this 25-minute visit was spent on counseling and coordination of care and discussion with care team and answering questions   Antony Contras, MD    To contact Stroke Continuity provider, please refer to [email or web address] After hours, contact General Neurology

## 2019-09-25 NOTE — Progress Notes (Addendum)
MEWS/VS Documentation      09/25/2019 [id number] 09/25/2019 0815 09/25/2019 1057 09/25/2019 1214   MEWS Score:  1  1  1  2    MEWS Score Color:  Green  Green  Green  Yellow   Resp:  -  -  20  -   Pulse:  -  -  96  -   BP:  -  -  (!) 166/98  -   Temp:  -  -  98.6 F (37 C)  -   O2 Device:  -  -  Room Air  -   Level of Consciousness:  Responds to Voice  Responds to Voice  -  Responds to Pain    No acute change in patient's condition. MD aware. Will continue to monitor.

## 2019-09-25 NOTE — Progress Notes (Signed)
Occupational Therapy Treatment Patient Details Name: [name] MRN: [id number] DOB: 09-20-1934 Today's Date: 09/25/2019    History of present illness Pt is an 85 yo female admitted with L facial droop, dysarthria, and L side flaccid. Pt found to have R basal ganglia hemorrhage with IVH and SAH with cerebral edema and transfalcine herniation likely due to hypertensive urgency inital 84mm midline shift.  Pt with significant PMH of obesity, HTN, back pain s/p spine surgery, R knee surgery after fx.     OT comments  Pt continues to be difficult to arouse and only responding to pain on R side. Pt total A for rolling to get repositioned in bed. Total A to wash face supine in bed despite multimodal cues. D/t low level of arousal and limited participation in sessions pt more appropriate for SNF level therapies to maximize functional independence. Will let OTR know about change in POC- will continue to follow for acute OT needs.    Follow Up Recommendations  Supervision/Assistance - 24 hour;SNF    Equipment Recommendations  Other (comment)(tbd to next venue)    Recommendations for Other Services      Precautions / Restrictions Precautions Precautions: Fall Precaution Comments: SBP goal <160 Restrictions Weight Bearing Restrictions: No       Mobility Bed Mobility Overal bed mobility: Needs Assistance Bed Mobility: Rolling Rolling: Total assist;+2 for physical assistance         General bed mobility comments: pt rolls with total A to reposition; pt left in chair position to assist with level of arousal  Transfers                 General transfer comment: unable to transfer d/t low arousal    Balance                                           ADL either performed or assessed with clinical judgement   ADL Overall ADL's : Needs assistance/impaired     Grooming: Wash/dry face;Bed level;Total assistance Grooming Details (indicate cue type and reason):  pt unable to arouse despite MAX mulitmodal cues; total A to wash face supine in bed                   Toilet Transfer Details (indicate cue type and reason): unable to transfer this date d/t low arousal and unresponsive to all stimuli         Functional mobility during ADLs: Total assistance General ADL Comments: pt total A to wash face supine in bed; total A +2 for rolling to reposition     Vision Baseline Vision/History: Glaucoma Patient Visual Report: Other (comment) Vision Assessment?: Vision impaired- to be further tested in functional context Additional Comments: pt eyes closed throughout entire session; manually opened by therapist with no attempt to track   Perception     Praxis      Cognition Arousal/Alertness: Lethargic Behavior During Therapy: Flat affect Overall Cognitive Status: Difficult to assess                                 General Comments: pts eyes closed throughout entire session;  unable to arouse despite multimodal cue        Exercises     Shoulder Instructions       General Comments  Pertinent Vitals/ Pain       Pain Assessment: Faces Faces Pain Scale: Hurts even more Pain Location: general grimacing when moving RUE Pain Descriptors / Indicators: Grimacing Pain Intervention(s): Monitored during session;Limited activity within patient's tolerance;Repositioned  Home Living                                          Prior Functioning/Environment              Frequency  Min 2X/week        Progress Toward Goals  OT Goals(current goals can now be found in the care plan section)  Progress towards OT goals: Not progressing toward goals - comment(pt with low arousal, lethargic, not responsiveness to stimuli; currently total A for rolling)  Acute Rehab OT Goals Patient Stated Goal: to get better OT Goal Formulation: With patient Time For Goal Achievement: 10/04/19 Potential to Achieve  Goals: Fair  Plan      Co-evaluation                 AM-PAC OT "6 Clicks" Daily Activity     Outcome Measure   Help from another person eating meals?: Total Help from another person taking care of personal grooming?: Total Help from another person toileting, which includes using toliet, bedpan, or urinal?: Total Help from another person bathing (including washing, rinsing, drying)?: Total Help from another person to put on and taking off regular upper body clothing?: Total Help from another person to put on and taking off regular lower body clothing?: Total 6 Click Score: 6    End of Session    OT Visit Diagnosis: Other symptoms and signs involving the nervous system (R29.898);Other symptoms and signs involving cognitive function;Hemiplegia and hemiparesis;Pain;Muscle weakness (generalized) (M62.81);Other abnormalities of gait and mobility (R26.89) Hemiplegia - Right/Left: Left Hemiplegia - dominant/non-dominant: Non-Dominant Hemiplegia - caused by: Nontraumatic intracerebral hemorrhage Pain - Right/Left: Right Pain - part of body: Leg   Activity Tolerance Patient limited by fatigue;Patient limited by lethargy   Patient Left in bed;with call bell/phone within reach;with bed alarm set;with family/visitor present   Nurse Communication Mobility status        Time: [id number] OT Time Calculation (min): 23 min  Charges: OT General Charges $OT Visit: 1 Visit OT Treatments $Self Care/Home Management : 8-22 mins  [place], [place] 09/25/2019, 4:42 PM

## 2019-09-25 NOTE — Care Management Important Message (Signed)
Important Message  Patient Details  Name: [name] MRN: [id number] Date of Birth: [birth year]   Medicare Important Message Given:  Yes     Laira Penninger Montine Circle 09/25/2019, 3:34 PM

## 2019-09-25 NOTE — Progress Notes (Signed)
Palliative:  HPI: 85 y.o.femalewith past medical history of obesity, HTN, and HLDadmitted on 9/21/2020with L facial droop, dysarthria, and L side flaccidity. CT obtained and patient found to have intracranial hemorrhage with edema and shift. PMT consulted for [place] per daughter's request.   I met today at [facility] bedside with her brother and twin sister. Her daughter is not present and they tell me that she is working today. Unfortunately this is my first assessment of [name] and she is completely unresponsive and would not follow any commands. She does at one point lift her right hand (with mitten) to rub her forehead but this was the only movement or response I have witnessed.   We further discussed the decline today in her mental status. We discussed CT head showing persistent edema and shift but no worsening. We discussed the concern for aspiration pneumonia and that this is a major concern for her ability to benefit from PEG tube. I explained decision for SNF/PEG vs comfort care. We discussed her QOL and her sister says that she loved to eat and cook. We discussed likelihood of repeat infection, skin breakdown, and functional decline with SNF/PEG. Sister tells me that she worked many years as a Music therapist with hospice so is familiar with comfort care. Sister understands but they are not the legal decision maker but daughter has requested input and support from patient grandson and siblings since she is an only child. Sister plans to speak with daughter tonight.   I did run into daughter (she is a Chartered certified accountant here at the hospital) but she was working and unable to speak. She did mention having a family meeting this weekend. I did give her my card and contact for anyway we can help support them.   All questions/concerns addressed. Emotional support provided. I do get the impression that family is in no rush to make a decision. Will allow them time to discuss as a family and will follow up tomorrow.    Exam: Minimally responsive. No distress. Obese. Some movement of right hand only and not to command but somewhat purposeful movement. Does not open eyes. Some withdrawal to painful stimuli. Breathing regular, unlabored. Abd soft. Cortrak with feeding to nare.   Plan: - Family still discussing options. No decisions yet.  - Will follow up tomorrow.   [place], NP Palliative Medicine Team Pager [phone] (Please see [email or web address] for schedule) Team Phone [phone]    Greater than 50%  of this time was spent counseling and coordinating care related to the above assessment and plan

## 2019-09-25 NOTE — Progress Notes (Signed)
  Speech Language Pathology Treatment: Dysphagia;Cognitive-Linquistic  Patient Details Name: [name] MRN: [id number] DOB: Jan 15, 1934 Today's Date: 09/25/2019 Time: [id number] SLP Time Calculation (min) (ACUTE ONLY): 22 min  Assessment / Plan / Recommendation Clinical Impression  Pt seen for dysphagia and cognitive therapy. Upon arrival pt with head tilted right, eyes closed. Though pt did respond to visual and verbal cueing, followed commands and responded Y/N via head nod, and mmhmm/uhuh responses, she kept her eyes closed throughout the session and her head could not be successfully positioned upright. She successfully responded to 3/3 biographical Y/N questions, but then seemed to drift off and stopped responding. She nodded yes when asked if she would like a bite of applesauce, but oral response was minimal, max cues were needed to elicit one swallow, which was followed by coughing. Pt never became responsive enough to fully particiapte in advanced PO trials, and she would not be able to participate in an MBS with this level of arousal. Will continue to follow for improved responsiveness, however it is probably time to assume pt is unlikely to nutritionally support herself into the short term regardless of swallow function.   HPI HPI: [name] is an 85 y.o. F with a history of sciatica, obesity, hypertension, hyperlipidemia and headache. Pt was with daughter and had sudden onset L side weakness, L facial droop, and slurred speech. Upon arrival L neglect was noted with no sensation retained on L side. 9/21 head CT showed large right basal ganglia hemorrhage with intraventricular and subarachnoid extension and 4 mm of right-to-left midline shift. Following 9/22 CT demonstrated significant increase in size of R basal ganglia hemorrhage, increase in intraventricular extension of blood, possible ventricular outflow obstruction, and increase in mass effect of midline shift from 53mm to 62mm. Neuro  consulted. Pt placed on nasal cannula oxygen 9/21, CXR was unremarkable. She is currently requiring suction to clear secretions, diet is NPO at this time.       SLP Plan  Continue with current plan of care       Recommendations  Diet recommendations: NPO                Plan: Continue with current plan of care       GO               Herbie [place], MA [place] Pager [phone] Office [phone]  Lynann Beaver 09/25/2019, 1:03 PM

## 2019-09-26 LAB — GLUCOSE, CAPILLARY
Glucose-Capillary: 137 mg/dL — ABNORMAL HIGH (ref 70–99)
Glucose-Capillary: 139 mg/dL — ABNORMAL HIGH (ref 70–99)
Glucose-Capillary: 139 mg/dL — ABNORMAL HIGH (ref 70–99)
Glucose-Capillary: 139 mg/dL — ABNORMAL HIGH (ref 70–99)
Glucose-Capillary: 143 mg/dL — ABNORMAL HIGH (ref 70–99)
Glucose-Capillary: 150 mg/dL — ABNORMAL HIGH (ref 70–99)

## 2019-09-26 NOTE — Progress Notes (Signed)
Unable to access pt. Pt only responds to pain. Pt does not follow any commands

## 2019-09-26 NOTE — Progress Notes (Signed)
Inpatient Rehab Admissions Coordinator:   Note therapy recommendations updated to SNF. Will sign off at this time.   Shann Medal, PT, DPT Admissions Coordinator [phone] 09/26/19  11:16 AM

## 2019-09-26 NOTE — Progress Notes (Signed)
Palliative:  No change in [name]'s status from yesterday. No family at bedside. I reached out to daughter, [name], and she agrees to meet with me tomorrow 10/1 1200 pm. I encouraged her to invite her son, aunts/uncles to the meeting as she has desired them to be a part of the conversation and decisions. I believe having them all available to discuss together will be beneficial.   No charge  Vinie Sill, NP Palliative Medicine Team Pager [phone] (Please see [email or web address] for schedule) Team Phone [phone]

## 2019-09-26 NOTE — Progress Notes (Addendum)
Attempted to assess pt's LOC. Pt is lethargic and is unable to follow commands as stated from previous nurse. Pt will not open her eyes. Her right arm moves with purposeful movement while sleeping. Left side has no movement. Will continue to monitor.

## 2019-09-26 NOTE — Progress Notes (Addendum)
Nutrition Follow-up  DOCUMENTATION CODES:   Morbid obesity  INTERVENTION:   -Continue Osmolite 1.2 @ 55 ml/hr via small bore feeding tube  30 ml Prostat BID.    Tube feeding regimen provides 1754 kcal (100% of needs), 103 grams of protein, and 1070 ml of H2O.   NUTRITION DIAGNOSIS:   Inadequate oral intake related to inability to eat as evidenced by NPO status.  Ongoing  GOAL:   Patient will meet greater than or equal to 90% of their needs  Met with TF  MONITOR:   TF tolerance  REASON FOR ASSESSMENT:   Consult Enteral/tube feeding initiation and management  ASSESSMENT:   Pt with PMH of obesity, HTN, HLD admitted with R large IVH and SAH with cerebral edema and transfalcine herniation likely due to HTN.  9/25 cortrak placed by IR  Reviewed I/O's: +693 ml x 24 hours and +2.7 L since admission  UOP: 1.3 L x 24 hours  Pt in bed at time of visit. She did not respond to voice or touch.  Reviewed SLP notes, pt not alert enough to take PO diet or participate in further swallow assessments.   Pt with cortrak tube- Osmolite 1.2 infusing at 55 ml/hr. Pt also receiving 30 ml Prostat BID. Complete TF regimen provides 1754 kcals, 103 grams protein, and 1070 ml free water daily, meeting 100% of estimated kcal and protein needs.  Per MD notes, pt with very poor prognosis. Awaiting family meeting with palliative care for goals of care discussions.   Medicatons reviewed and include 0.9% sodium chloride infusion @ 75 ml/hr.   Labs reviewed: CBGS: 126-143.   Diet Order:   Diet Order            Diet NPO time specified  Diet effective now              EDUCATION NEEDS:   No education needs have been identified at this time  Skin:  Skin Assessment: Reviewed RN Assessment  Last BM:  09/25/19  Height:   Ht Readings from Last 1 Encounters:  09/17/19 5' 3"  (1.6 m)    Weight:   Wt Readings from Last 1 Encounters:  09/26/19 116.4 kg    Ideal Body Weight:  52.2  kg  BMI:  Body mass index is 45.46 kg/m.  Estimated Nutritional Needs:   Kcal:  1600-1800  Protein:  85-100 grams  Fluid:  > 1.6 L/day    Francies Inch A. Jimmye Norman, RD, LDN, [place] Registered Dietitian II Certified Diabetes Care and Education Specialist Pager: [phone] After hours Pager: [phone]

## 2019-09-26 NOTE — Progress Notes (Signed)
STROKE TEAM PROGRESS NOTE   INTERVAL HISTORY Patient's mental status and general medical condition  Continues  to be declining gradually over the last few days.  She remains afebrile   She has been poorly responsive and not speaking following commands consistently. A repeat stat CT scan of the head y`day  showed slight decrease in right hemispheric parenchymal hemorrhage though there is still persistent edema and mass-effect and right-to-left shift which is unchanged. Patient's family was supposed to meet with palliative care yesterday to decide on goals of care but this meeting did not happen.  I called and spoke to patient's daughter over the phone today and she is stated she would try to make it happen today Vitals:   09/25/19 1720 09/25/19 2137 09/26/19 0517 09/26/19 1229  BP: (!) 143/65 (!) 142/73 136/60 140/89  Pulse: 78 90 100 100  Resp:    18  Temp:  98.5 F (36.9 C) 97.9 F (36.6 C) 98.3 F (36.8 C)  TempSrc:  Axillary Temporal Axillary  SpO2:  94% 94% 95%  Weight:   116.4 kg   Height:        CBC:  Recent Labs  Lab 09/24/19 0443 09/25/19 0256  WBC 15.3* 15.0*  NEUTROABS 9.6* 9.5*  HGB 13.6 13.2  HCT 45.0 41.7  MCV 87.4 85.5  PLT 297 [id number]    Basic Metabolic Panel:  Recent Labs  Lab 09/22/19 0448  09/24/19 0443 09/24/19 1013 09/25/19 0256  NA 149*   < > 149* 147* 145  K 3.6   < > 4.8  --  3.8  CL 119*   < > 120*  --  112*  CO2 21*   < > 21*  --  22  GLUCOSE 132*   < > 118*  --  137*  BUN 16   < > 18  --  19  CREATININE 0.75   < > 0.59  --  0.66  CALCIUM 8.4*   < > 8.8*  --  8.9  MG 2.1  --   --   --   --    < > = values in this interval not displayed.   IMAGING past 24h No results found.  PHYSICAL EXAM    General - obese, elderly African-American lady, lethargic.  Ophthalmologic - fundi not visualized due to noncooperation.  Cardiovascular - Regular rhythm and rate.  Neurological Exam : - lethargic, eyes closed, did open to voice and pain, however,  unable to answer her name,  Month,year,.  Follows only minimum occasional commands.  Right gaze preference, did not move across midline, pupils 1.5 mm, sluggish to light, positive corneal. Severe left facial droop, tongue midline. Left hemiplegia with slight toe withdraw on pain stimulation. Right UE and LE spontaneous movement, RUE against gravity and RLE at least 2/5. Sensation subjective intact bilaterally per patient. Coordination not cooperative. Gait not tested.   ASSESSMENT/PLAN [name] is a 85 y.o. female with history of sciatica, obesity, hypertension, hyperlipidemia and headache presenting with left facial droop, right eye gaze deviation, left-sided flaccidity and no sensation on the left side.   ICH: R large basal ganglia hemorrhage w/ IVH and SAH with cerebral edema and transfalcine herniation likely due to hypertensive etiology  Code Stroke CT head large R basal ganglia hemorrhage w/ IVH and SAH, 4 mm midline shift.     CTA head no vascular lesion seen. No ELVO.  Repeat CT head 9/22 significant increase in size of R basal ganglia ICH  and IVH. Mild dilatation L lateral ventricle. Increased midline shift to 15 mm.  Carotid Doppler unremarkable  CT repeat 9/24 no significant change R basal ganglia hemorrhage 65mm midline shift, concern for ventricular entrapment and early uncal herniation. IVH stable.  CT repeat 9/26 - No significant interval change in size and appearance of large right basal ganglia intraparenchymal hemorrhage with intraventricular extension. Associated regional mass effect with 10 mm of right-to-left midline shift, not significantly changed.   CT repeat 9/28 unchanged lobar hemorrhage and IVH w/ edema and mass effect  Neuro worse today. Repeat CT head 9/29 repeat stat CT scan of the head  shows slight decrease in right hemispheric parenchymal hemorrhage though there is still persistent edema and mass-effect and right-to-left shift which is  unchanged.  2D Echo EF 65 to 70%  LDL UTC d/t TG > 400. Direct LDL 95.2  HgbA1c 6.3  SCDs for VTE prophylaxis  aspirin 81 mg daily prior to admission, now on No antithrombotic given hemorrhage   Therapy recommendations:  SNF  Disposition:  Pending  Palliative care on board, following up with family today again as family needed more time     Code status - DNR  Cerebral Edema and Transfalcine Herniation  Increase of hemorrhage size since admission on repeat CT  Repeat CT no significant change R basal ganglia hemorrhage 2mm midline shift, concern for ventricular entrapment and early uncal herniation. IVH stable.  CT repeat 9/26 - No significant interval change in size and appearance of large right basal ganglia intraparenchymal hemorrhage with intraventricular extension. Associated regional mass effect with 10 mm of right-to-left midline shift, not significantly changed.   CT repeat 9/28 unchanged lobar hemorrhage and IVH w/ edema and mass effect  Started on 3% protocol via PIV, drip now off -> restarted Friday - 50 cc / hr - now stopped  Na 145  NS gtt now -> 1/2NS-> NS  Hypertensive Emergency  BP as high as 200/110  Home meds:  Cozaar 25  On Cleviprex drip, was maximized, now off  On labetalol drip, now off   Labetalol and hydralazine PRN . SBP goal < 160   Headache and right thigh pain  HA due to large right ICH  Did not pass swallow  Tylenol PR as needed  IV morphine every 2 hours as needed as per palliative care  Hyperlipidemia  Home meds:  Pravachol 10  LDL Direct LDL 95.2, TG > 400, goal < 70.   Statin on hold given ICH  Continue statin at discharge  Dysphagia . Secondary to stroke . Did not pass swallow . NPO . On Cortrak and TF . Speech on board . On IV fluid . dtr does not think she would want PEG   Possible Aspiration PNA  Other Stroke Risk Factors  Advanced age  Former Cigarette smoker, quit 25 yrs ago  Morbid Obesity,  Body mass index is 45.46 kg/m., recommend weight loss, diet and exercise as appropriate   Family hx stroke (father, brother)  Other Active Problems  Leukocytosis 15.0 - likely reactive.  CXR ok.    Fever - 100.1 CXR (Mild bibasilar subsegmental atelectasis) ; U/A & Culture (U/A - neg ; Culture - insign growth) ; Blood Cultures - (no growth 3d)  Hypokalemia, resolved  multiple loose stools - C-diff  Negative. may be secondary to Osmolite tube feeding supplement  Hospital day # 9  Patient has shown neurological worsening since admission with CT scan yesterday showing decrease in hematoma size but  persistent cytotoxic edema and midline shift.  I suspect she may be developing some aspiration pneumonia.  Patient is DNR but the prognosis for surviving this and making meaningful neurological recovery is quite poor.  Palliative care team plan to discuss with family goals of care soon and hopefully may succeed in making patient comfort care measures only.  Continue ongoing management till then.  Discussed with palliative care team MD Dr. Rowe Pavy   Greater than 50% time during this 25-minute visit was spent on counseling and coordination of care and discussion with care team and answering questions   Antony Contras, MD    To contact Stroke Continuity provider, please refer to [email or web address] After hours, contact General Neurology

## 2019-09-27 DIAGNOSIS — Z515 Encounter for palliative care: Secondary | ICD-10-CM

## 2019-09-27 LAB — CULTURE, BLOOD (ROUTINE X 2)
Culture: NO GROWTH
Culture: NO GROWTH
Special Requests: ADEQUATE

## 2019-09-27 LAB — GLUCOSE, CAPILLARY
Glucose-Capillary: 124 mg/dL — ABNORMAL HIGH (ref 70–99)
Glucose-Capillary: 134 mg/dL — ABNORMAL HIGH (ref 70–99)
Glucose-Capillary: 135 mg/dL — ABNORMAL HIGH (ref 70–99)
Glucose-Capillary: 153 mg/dL — ABNORMAL HIGH (ref 70–99)

## 2019-09-27 MED ORDER — ACETAMINOPHEN 500 MG PO TABS
1000.0000 mg | ORAL_TABLET | ORAL | Status: AC
  Administered 2019-09-27: 14:00:00 1000 mg
  Filled 2019-09-27: qty 2

## 2019-09-27 MED ORDER — POLYVINYL ALCOHOL 1.4 % OP SOLN: 1.0000 [drp] | Freq: Four times a day (QID) | OPHTHALMIC | Status: DC | PRN

## 2019-09-27 MED ORDER — GLYCOPYRROLATE 0.2 MG/ML IJ SOLN: 0.2000 mg | INTRAMUSCULAR | Status: DC | PRN

## 2019-09-27 MED ORDER — MORPHINE SULFATE (PF) 2 MG/ML IV SOLN
2.0000 mg | Freq: Four times a day (QID) | INTRAVENOUS | Status: DC
  Administered 2019-09-27 – 2019-09-28 (×5): 2 mg via INTRAVENOUS
  Filled 2019-09-27 (×5): qty 1

## 2019-09-27 MED ORDER — HALOPERIDOL LACTATE 2 MG/ML PO CONC
0.5000 mg | ORAL | Status: DC | PRN
  Filled 2019-09-27: qty 0.3

## 2019-09-27 MED ORDER — BIOTENE DRY MOUTH MT LIQD: 15.0000 mL | OROMUCOSAL | Status: DC | PRN

## 2019-09-27 MED ORDER — HALOPERIDOL LACTATE 5 MG/ML IJ SOLN: 0.5000 mg | INTRAMUSCULAR | Status: DC | PRN

## 2019-09-27 MED ORDER — MORPHINE SULFATE (PF) 2 MG/ML IV SOLN
2.0000 mg | INTRAVENOUS | Status: DC | PRN
  Administered 2019-09-27: 2 mg via INTRAVENOUS
  Filled 2019-09-27: qty 1

## 2019-09-27 MED ORDER — GLYCOPYRROLATE 0.2 MG/ML IJ SOLN
0.2000 mg | Freq: Three times a day (TID) | INTRAMUSCULAR | Status: DC
  Administered 2019-09-27 – 2019-09-29 (×5): 0.2 mg via INTRAVENOUS
  Filled 2019-09-27 (×5): qty 1

## 2019-09-27 NOTE — TOC Progression Note (Addendum)
Transition of Care [facility]) - Progression Note    Patient Details  Name: [name] MRN: [id number] Date of Birth: 07-Apr-1934  Transition [facility]) CM/SW Contact  Sharin Mons, RN Phone Number: 09/27/2019, 2:56 PM  Clinical Narrative:     NCM received message from palliative liaison family requesting residential hospice / [facility]. NCM spoke with daughter [name] who confirmed d/c plan, residential hospice care. Choice given and [employer] selected. Referral made with Manufacturing engineer for residential hospice. TOC team will continue to monitor and assist with disposition needs.   Expected Discharge Plan and Services   Social Determinants of Health (SDOH) Interventions    Readmission Risk Interventions No flowsheet data found.

## 2019-09-27 NOTE — Progress Notes (Signed)
SLP Cancellation Note  Patient Details Name: [name] MRN: [id number] DOB: 07-22-1934   Cancelled treatment:       Reason Eval/Treat Not Completed: Fatigue/lethargy limiting ability to participate. Pt currently poorly responsive - did not rouse to verbal or tactile stim, including sternal rub. MD indicates Pt "continues to be declining gradually over the last few days". Plan for Palliative Care meeting with family at noon today. SLP will follow up once [place] established.  Ora Mcnatt B. [facility], CCC-SLP Speech Language Pathologist [phone]  Shonna Chock 09/27/2019, 10:32 AM

## 2019-09-27 NOTE — Progress Notes (Signed)
NG tube removed per MD order. Patient tolerated well.

## 2019-09-27 NOTE — Plan of Care (Signed)
Pt is comfort measures only.     Problem: Education: Goal: Knowledge of disease or condition will improve Outcome: Not Progressing Goal: Knowledge of secondary prevention will improve Outcome: Not Progressing Goal: Knowledge of patient specific risk factors addressed and post discharge goals established will improve Outcome: Not Progressing Goal: Individualized Educational Video(s) Outcome: Not Progressing   Problem: Coping: Goal: Will verbalize positive feelings about self Outcome: Not Progressing Goal: Will identify appropriate support needs Outcome: Not Progressing   Problem: Health Behavior/Discharge Planning: Goal: Ability to manage health-related needs will improve Outcome: Not Progressing   Problem: Self-Care: Goal: Ability to participate in self-care as condition permits will improve Outcome: Not Progressing Goal: Verbalization of feelings and concerns over difficulty with self-care will improve Outcome: Not Progressing Goal: Ability to communicate needs accurately will improve Outcome: Not Progressing   Problem: Nutrition: Goal: Risk of aspiration will decrease Outcome: Not Progressing Goal: Dietary intake will improve Outcome: Not Progressing   Problem: Intracerebral Hemorrhage Tissue Perfusion: Goal: Complications of Intracerebral Hemorrhage will be minimized Outcome: Not Progressing   Problem: Clinical Measurements: Goal: Ability to maintain clinical measurements within normal limits will improve Outcome: Not Progressing Goal: Will remain free from infection Outcome: Not Progressing Goal: Diagnostic test results will improve Outcome: Not Progressing Goal: Respiratory complications will improve Outcome: Not Progressing Goal: Cardiovascular complication will be avoided Outcome: Not Progressing   Problem: Activity: Goal: Risk for activity intolerance will decrease Outcome: Not Progressing   Problem: Coping: Goal: Level of anxiety will  decrease Outcome: Not Progressing

## 2019-09-27 NOTE — Progress Notes (Signed)
Manufacturing engineer [facility]) Hospice  Referral received for residential hospice at [facility].  [facility] does not have bed availability today.  Spoke with daughter [name], confirmed interest. 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  Will update family and TOC manager should a bed become available tomorrow.  Thank you, Venia Carbon RN, BSN, [place] [facility] Liaison (in [place]) [phone]

## 2019-09-27 NOTE — Progress Notes (Signed)
   09/27/19 1957  MEWS Score  Level of Consciousness Unresponsive  MEWS Score  MEWS RR 0  MEWS Pulse 0  MEWS Systolic 0  MEWS LOC 3  MEWS Temp 0  MEWS Score 3  MEWS Score Color Comfort Care Only  MEWS Assessment  Is this an acute change? No  MEWS Guidelines - (patients age 83 and over)  Red - At High Risk for Deterioration Yellow - At risk for Deterioration  1. Go to room and assess patient 2. Validate data. Is this patient's baseline? If data confirmed: 3. Is this an acute change? 4. Administer prn meds/treatments as ordered. 5. Note Sepsis score 6. Review goals of care 7. Sports coach, RRT nurse and Provider. 8. Ask Provider to come to bedside.  9. Document patient condition/interventions/response. 10. Increase frequency of vital signs and focused assessments to at least q15 minutes x 4, then q30 minutes x2. - If stable, then q1h x3, then q4h x3 and then q8h or dept. routine. - If unstable, contact Provider & RRT nurse. Prepare for possible transfer. 11. Add entry in progress notes using the smart phrase ".MEWS". 1. Go to room and assess patient 2. Validate data. Is this patient's baseline? If data confirmed: 3. Is this an acute change? 4. Administer prn meds/treatments as ordered? 5. Note Sepsis score 6. Review goals of care 7. Sports coach and Provider 8. Call RRT nurse as needed. 9. Document patient condition/interventions/response. 10. Increase frequency of vital signs and focused assessments to at least q2h x2. - If stable, then q4h x2 and then q8h or dept. routine. - If unstable, contact Provider & RRT nurse. Prepare for possible transfer. 11. Add entry in progress notes using the smart phrase ".MEWS".  Green - Likely stable Lavender - Comfort Care Only  1. Continue routine/ordered monitoring.  2. Review goals of care. 1. Continue routine/ordered monitoring. 2. Review goals of care.

## 2019-09-27 NOTE — Progress Notes (Signed)
STROKE TEAM PROGRESS NOTE   INTERVAL HISTORY Patient's condition remains poor.  She is stuporous and barely responsive.  She is motors and has purposeful movements on the right upper extremity but none in the lower extremity and remains paralyzed on the left.  She has increased work of breathing and upper airway sounds.  She remains afebrile Vitals:   09/26/19 1229 09/26/19 2059 09/27/19 0545 09/27/19 0548  BP: 140/89 134/66 127/71   Pulse: 100 97 87   Resp: 18 16 16    Temp: 98.3 F (36.8 C) 98.3 F (36.8 C) 99 F (37.2 C)   TempSrc: Axillary Oral Oral   SpO2: 95% 92% 94%   Weight:    117 kg  Height:        CBC:  Recent Labs  Lab 09/24/19 0443 09/25/19 0256  WBC 15.3* 15.0*  NEUTROABS 9.6* 9.5*  HGB 13.6 13.2  HCT 45.0 41.7  MCV 87.4 85.5  PLT 297 [id number]    Basic Metabolic Panel:  Recent Labs  Lab 09/22/19 0448  09/24/19 0443 09/24/19 1013 09/25/19 0256  NA 149*   < > 149* 147* 145  K 3.6   < > 4.8  --  3.8  CL 119*   < > 120*  --  112*  CO2 21*   < > 21*  --  22  GLUCOSE 132*   < > 118*  --  137*  BUN 16   < > 18  --  19  CREATININE 0.75   < > 0.59  --  0.66  CALCIUM 8.4*   < > 8.8*  --  8.9  MG 2.1  --   --   --   --    < > = values in this interval not displayed.   IMAGING past 24h No results found.  PHYSICAL EXAM    General - obese, elderly African-American lady, lethargic.  Ophthalmologic - fundi not visualized due to noncooperation.  Cardiovascular - Regular rhythm and rate.  Neurological Exam : -Stuporous eyes closed, did open to voice and pain, however, unable to answer her name,  Month,year,.  Not following any commands.  Right gaze preference, did not move across midline, pupils 1.5 mm, sluggish to light, positive corneal. Severe left facial droop, tongue midline. Left hemiplegia with slight toe withdraw on pain stimulation. Right UE has spontaneous movement, RUE against gravity and RLE trace withdrawal only. Sensation subjective intact bilaterally  per patient. Coordination not cooperative. Gait not tested.   ASSESSMENT/PLAN [name] is a 85 y.o. female with history of sciatica, obesity, hypertension, hyperlipidemia and headache presenting with left facial droop, right eye gaze deviation, left-sided flaccidity and no sensation on the left side.   ICH: R large basal ganglia hemorrhage w/ IVH and SAH with cerebral edema and transfalcine herniation likely due to hypertensive etiology  Code Stroke CT head large R basal ganglia hemorrhage w/ IVH and SAH, 4 mm midline shift.     CTA head no vascular lesion seen. No ELVO.  Repeat CT head 9/22 significant increase in size of R basal ganglia ICH and IVH. Mild dilatation L lateral ventricle. Increased midline shift to 15 mm.  Carotid Doppler unremarkable  CT repeat 9/24 no significant change R basal ganglia hemorrhage 37mm midline shift, concern for ventricular entrapment and early uncal herniation. IVH stable.  CT repeat 9/26 - No significant interval change in size and appearance of large right basal ganglia intraparenchymal hemorrhage with intraventricular extension. Associated regional mass effect  with 10 mm of right-to-left midline shift, not significantly changed.   CT repeat 9/28 unchanged lobar hemorrhage and IVH w/ edema and mass effect  Neuro worse today. Repeat CT head 9/29 repeat stat CT scan of the head  shows slight decrease in right hemispheric parenchymal hemorrhage though there is still persistent edema and mass-effect and right-to-left shift which is unchanged.  2D Echo EF 65 to 70%  LDL UTC d/t TG > 400. Direct LDL 95.2  HgbA1c 6.3  SCDs for VTE prophylaxis  aspirin 81 mg daily prior to admission, now on No antithrombotic given hemorrhage   Therapy recommendations:  SNF  Disposition:  Pending  Palliative care on board, following up with family today again as family needed more time     Code status - DNR  Cerebral Edema and Transfalcine  Herniation  Increase of hemorrhage size since admission on repeat CT  Repeat CT no significant change R basal ganglia hemorrhage 17mm midline shift, concern for ventricular entrapment and early uncal herniation. IVH stable.  CT repeat 9/26 - No significant interval change in size and appearance of large right basal ganglia intraparenchymal hemorrhage with intraventricular extension. Associated regional mass effect with 10 mm of right-to-left midline shift, not significantly changed.   CT repeat 9/28 unchanged lobar hemorrhage and IVH w/ edema and mass effect  Started on 3% protocol via PIV, drip now off -> restarted Friday - 50 cc / hr - now stopped  Na 145  NS gtt now -> 1/2NS-> NS  Hypertensive Emergency  BP as high as 200/110  Home meds:  Cozaar 25  On Cleviprex drip, was maximized, now off  On labetalol drip, now off   Labetalol and hydralazine PRN . SBP goal < 160   Headache and right thigh pain  HA due to large right ICH  Did not pass swallow  Tylenol PR as needed  IV morphine every 2 hours as needed as per palliative care  Hyperlipidemia  Home meds:  Pravachol 10  LDL Direct LDL 95.2, TG > 400, goal < 70.   Statin on hold given ICH  Continue statin at discharge  Dysphagia . Secondary to stroke . Did not pass swallow . NPO . On Cortrak and TF . Speech on board . On IV fluid . dtr does not think she would want PEG   Possible Aspiration PNA  Other Stroke Risk Factors  Advanced age  Former Cigarette smoker, quit 25 yrs ago  Morbid Obesity, Body mass index is 45.69 kg/m., recommend weight loss, diet and exercise as appropriate   Family hx stroke (father, brother)  Other Active Problems  Leukocytosis 15.0 - likely reactive.  CXR ok.    Fever - 100.1 CXR (Mild bibasilar subsegmental atelectasis) ; U/A & Culture (U/A - neg ; Culture - insign growth) ; Blood Cultures - (no growth 3d)  Hypokalemia, resolved  multiple loose stools - C-diff   Negative. may be secondary to Osmolite tube feeding supplement  Hospital day # 10  Patient has shown neurological worsening since admission with CT scan 09/25/19 showing decrease in hematoma size but persistent cytotoxic edema and midline shift.  I suspect she may be developing some aspiration pneumonia.  Patient is DNR but the prognosis for surviving this and making meaningful neurological recovery is quite poor.  Palliative care team plan to discuss with family goals of care today and hopefully may succeed in making patient comfort care measures only.  Continue ongoing management till then.  Mamie Nick  Leonie Man, MD    To contact Stroke Continuity provider, please refer to [email or web address] After hours, contact General Neurology

## 2019-09-27 NOTE — Progress Notes (Signed)
Palliative:  HPI: 85 y.o.femalewith past medical history of obesity, HTN, and HLDadmitted on 9/21/2020with L facial droop, dysarthria, and L side flaccidity. CT obtained and patient found to have intracranial hemorrhage with edema and shift. PMT consulted for [place] per daughter's request. [name] Family are considering transition to comfort care.   I met today at [facility] bedside with her daughter [name], grandson, and 2 sisters. [name] appears more uncomfortable today. She is very warm to touch and clammy and her breathing is fast and a little labored. This is new.   I discussed further with family her poor prognosis. We discussed the concern for aspiration pneumonia and that this is further evidence of what may be expected with aggressive care. We discussed expectations of infections, skin breakdown, and the fact that she continues with poor neurological status. I suspect that complications would continue to arise and leave her with a very poor QOL. They tell me how much she loves her family and loves eating, cooking, and feeding people and this would be a significant decrease in her QOL. We further discussed the other decision that would be focus on comfort care: removing feeding tube and providing her with medications to alleviate her suffering with no further tubes/sticks or testing.   Family opts at this time for comfort care. We discussed addition of medications for comfort. [name] used to Psychologist, occupational at [employer] and would like to proceed with transition to [employer] if her mother remains stable to go. All questions/concerns addressed. Emotional support provided.   Updated Dr. Leonie Man and CSW/CMRN.   Exam: Unresponsive. No movement today. Breathing labored with tachypnea and accessory muscle usage. Skin warm and clammy. Abd soft. Generalized edema.   Plan: - Full comfort care.  - Orders placed to ensure comfort. Scheduled low dose  morphine in place.  - Transition to [facility] per family wishes if bed available.   [id number] 60 min  Vinie Sill, NP Palliative Medicine Team Pager [phone] (Please see [email or web address] for schedule) Team Phone [phone]    Greater than 50%  of this time was spent counseling and coordinating care related to the above assessment and plan

## 2019-09-27 NOTE — Progress Notes (Signed)
PT Cancellation Note  Patient Details Name: [name] MRN: [id number] DOB: [birth year]   Cancelled Treatment:    Reason Eval/Treat Not Completed: Medical issues which prohibited therapy.  Nursing asked for PT hold, and pt is not alert and cannot be woken up as well.  Has been having lower O2 sats and so will re-attempt at another time.   Ramond Dial 09/27/2019, 1:10 PM   Mee Hives, PT MS Acute Rehab Dept. Number: [place] and [place]

## 2019-09-27 NOTE — Progress Notes (Signed)
Family met with palliative today and made the decision to move patient to comfort care.  Tube feeding and IV fluids discontinued.  SCD's discontinued.  Morphine ordered and given.  Patient in no apparent distress.

## 2019-09-28 DIAGNOSIS — G935 Compression of brain: Secondary | ICD-10-CM | POA: Diagnosis not present

## 2019-09-28 DIAGNOSIS — E46 Unspecified protein-calorie malnutrition: Secondary | ICD-10-CM | POA: Diagnosis present

## 2019-09-28 DIAGNOSIS — R197 Diarrhea, unspecified: Secondary | ICD-10-CM | POA: Diagnosis present

## 2019-09-28 DIAGNOSIS — G936 Cerebral edema: Secondary | ICD-10-CM | POA: Diagnosis present

## 2019-09-28 MED ORDER — MORPHINE SULFATE (PF) 2 MG/ML IV SOLN: 2.0000 mg | INTRAVENOUS | 0 refills | Status: AC | PRN

## 2019-09-28 MED ORDER — POLYVINYL ALCOHOL 1.4 % OP SOLN: 1.0000 [drp] | Freq: Four times a day (QID) | OPHTHALMIC | 0 refills | Status: AC | PRN

## 2019-09-28 MED ORDER — LORAZEPAM 2 MG/ML IJ SOLN: 1.0000 mg | INTRAMUSCULAR | 0 refills | Status: AC | PRN

## 2019-09-28 MED ORDER — GLYCOPYRROLATE 0.2 MG/ML IJ SOLN: 0.2000 mg | INTRAMUSCULAR | Status: AC | PRN

## 2019-09-28 MED ORDER — LORAZEPAM 2 MG/ML IJ SOLN: 1.0000 mg | INTRAMUSCULAR | Status: DC | PRN

## 2019-09-28 MED ORDER — HALOPERIDOL LACTATE 2 MG/ML PO CONC: 0.5000 mg | ORAL | 0 refills | Status: AC | PRN

## 2019-09-28 MED ORDER — GLYCOPYRROLATE 0.2 MG/ML IJ SOLN: 0.2000 mg | Freq: Three times a day (TID) | INTRAMUSCULAR | Status: AC

## 2019-09-28 MED ORDER — LIP MEDEX EX OINT: TOPICAL_OINTMENT | CUTANEOUS | 0 refills | Status: AC | PRN

## 2019-09-28 MED ORDER — MORPHINE SULFATE (PF) 2 MG/ML IV SOLN
2.0000 mg | INTRAVENOUS | Status: DC
  Administered 2019-09-29 (×4): 2 mg via INTRAVENOUS
  Filled 2019-09-28 (×4): qty 1

## 2019-09-28 MED ORDER — ACETAMINOPHEN 650 MG RE SUPP: 650.0000 mg | RECTAL | 0 refills | Status: AC | PRN

## 2019-09-28 NOTE — Progress Notes (Signed)
Nutrition Brief Note  Chart reviewed. Pt now transitioning to comfort care.  No further nutrition interventions warranted at this time.  Please re-consult as needed.   Lorian Yaun A. Jensen [place], RD, LDN, CDCES Registered Dietitian II Certified Diabetes Care and Education Specialist Pager: [phone] After hours Pager: [phone]  

## 2019-09-28 NOTE — Progress Notes (Signed)
[facility]) Hospital Liaison RN:  Update on [place] bed availability: Able to offer patient a [place] bed on 09/29/19.  Plan is for an [facility] Liaison to meet with patient daughter Elsie Lincoln) at [place] tomorrow morning to complete registration paperwork then transfer patient to [facility] before noon. CSW aware.  Please call with any Hospice related questions,  Thank you,  Gar Ponto, RN [facility] Liaison (on [place]) [phone]--

## 2019-09-28 NOTE — Progress Notes (Signed)
STROKE TEAM PROGRESS NOTE   INTERVAL HISTORY Patient remains stuporous and barely responsive.  S   She has increased work of breathing and upper airway sounds.  She is now full comfort care measures only. Family wants to move her to [facility] but no bed available today Vitals:   09/26/19 1229 09/26/19 2059 09/27/19 0545 09/27/19 0548  BP: 140/89 134/66 127/71   Pulse: 100 97 87   Resp: 18 16 16    Temp: 98.3 F (36.8 C) 98.3 F (36.8 C) 99 F (37.2 C)   TempSrc: Axillary Oral Oral   SpO2: 95% 92% 94%   Weight:    117 kg  Height:        CBC:  Recent Labs  Lab 09/24/19 0443 09/25/19 0256  WBC 15.3* 15.0*  NEUTROABS 9.6* 9.5*  HGB 13.6 13.2  HCT 45.0 41.7  MCV 87.4 85.5  PLT 297 [id number]    Basic Metabolic Panel:  Recent Labs  Lab 09/22/19 0448  09/24/19 0443 09/24/19 1013 09/25/19 0256  NA 149*   < > 149* 147* 145  K 3.6   < > 4.8  --  3.8  CL 119*   < > 120*  --  112*  CO2 21*   < > 21*  --  22  GLUCOSE 132*   < > 118*  --  137*  BUN 16   < > 18  --  19  CREATININE 0.75   < > 0.59  --  0.66  CALCIUM 8.4*   < > 8.8*  --  8.9  MG 2.1  --   --   --   --    < > = values in this interval not displayed.   IMAGING past 24h No results found.  PHYSICAL EXAM    General - obese, elderly African-American lady, lethargic.  Ophthalmologic - fundi not visualized due to noncooperation.  Cardiovascular - Regular rhythm and rate.  Neurological Exam : -Stuporous eyes closed, did open to voice and pain, however, unable to answer her name,  Month,year,.  Not following any commands.  Right gaze preference, did not move across midline, pupils 1.5 mm, sluggish to light, positive corneal. Severe left facial droop, tongue midline. Left hemiplegia with slight toe withdraw on pain stimulation. Right UE has spontaneous movement, RUE against gravity and RLE trace withdrawal only. Sensation subjective intact bilaterally per patient. Coordination not cooperative. Gait not  tested.   ASSESSMENT/PLAN [name] is a 85 y.o. female with history of sciatica, obesity, hypertension, hyperlipidemia and headache presenting with left facial droop, right eye gaze deviation, left-sided flaccidity and no sensation on the left side.   ICH: R large basal ganglia hemorrhage w/ IVH and SAH with cerebral edema and transfalcine herniation likely due to hypertensive etiology  Code Stroke CT head large R basal ganglia hemorrhage w/ IVH and SAH, 4 mm midline shift.     CTA head no vascular lesion seen. No ELVO.  Repeat CT head 9/22 significant increase in size of R basal ganglia ICH and IVH. Mild dilatation L lateral ventricle. Increased midline shift to 15 mm.  Carotid Doppler unremarkable  CT repeat 9/24 no significant change R basal ganglia hemorrhage 30mm midline shift, concern for ventricular entrapment and early uncal herniation. IVH stable.  CT repeat 9/26 - No significant interval change in size and appearance of large right basal ganglia intraparenchymal hemorrhage with intraventricular extension. Associated regional mass effect with 10 mm of right-to-left midline shift, not significantly  changed.   CT repeat 9/28 unchanged lobar hemorrhage and IVH w/ edema and mass effect  Neuro worse today. Repeat CT head 9/29 repeat stat CT scan of the head  shows slight decrease in right hemispheric parenchymal hemorrhage though there is still persistent edema and mass-effect and right-to-left shift which is unchanged.  2D Echo EF 65 to 70%  LDL UTC d/t TG > 400. Direct LDL 95.2  HgbA1c 6.3  SCDs for VTE prophylaxis  aspirin 81 mg daily prior to admission, now on No antithrombotic given hemorrhage   Therapy recommendations:  SNF  Disposition:  Pending  Palliative care on board, following up with family today again as family needed more time     Code status - DNR  Cerebral Edema and Transfalcine Herniation  Increase of hemorrhage size since admission on  repeat CT  Repeat CT no significant change R basal ganglia hemorrhage 11mm midline shift, concern for ventricular entrapment and early uncal herniation. IVH stable.  CT repeat 9/26 - No significant interval change in size and appearance of large right basal ganglia intraparenchymal hemorrhage with intraventricular extension. Associated regional mass effect with 10 mm of right-to-left midline shift, not significantly changed.   CT repeat 9/28 unchanged lobar hemorrhage and IVH w/ edema and mass effect  Started on 3% protocol via PIV, drip now off -> restarted Friday - 50 cc / hr - now stopped  Na 145  NS gtt now -> 1/2NS-> NS  Hypertensive Emergency  BP as high as 200/110  Home meds:  Cozaar 25  On Cleviprex drip, was maximized, now off  On labetalol drip, now off   Labetalol and hydralazine PRN . SBP goal < 160   Headache and right thigh pain  HA due to large right ICH  Did not pass swallow  Tylenol PR as needed  IV morphine every 2 hours as needed as per palliative care  Hyperlipidemia  Home meds:  Pravachol 10  LDL Direct LDL 95.2, TG > 400, goal < 70.   Statin on hold given ICH  Continue statin at discharge  Dysphagia . Secondary to stroke . Did not pass swallow . NPO . On Cortrak and TF . Speech on board . On IV fluid . dtr does not think she would want PEG   Possible Aspiration PNA  Other Stroke Risk Factors  Advanced age  Former Cigarette smoker, quit 25 yrs ago  Morbid Obesity, Body mass index is 45.69 kg/m., recommend weight loss, diet and exercise as appropriate   Family hx stroke (father, brother)  Other Active Problems  Leukocytosis 15.0 - likely reactive.  CXR ok.    Fever - 100.1 CXR (Mild bibasilar subsegmental atelectasis) ; U/A & Culture (U/A - neg ; Culture - insign growth) ; Blood Cultures - (no growth 3d)  Hypokalemia, resolved  multiple loose stools - C-diff  Negative. may be secondary to Osmolite tube feeding  supplement  Hospital day # 11  Patient is now full comfort care as per family wishes.  Appreciate help from palliative care team.  Await transfer to [facility] for hospice if bed becomes available.  Continue ongoing management till then.  Antony Contras, MD    To contact Stroke Continuity provider, please refer to [email or web address] After hours, contact General Neurology

## 2019-09-28 NOTE — Discharge Summary (Addendum)
Stroke Discharge Summary  Patient ID: [name]   MRN: [id number]      DOB: 06-May-1934  Date of Admission: 09/17/2019 Date of Discharge: 09/29/2019  Attending Physician:  Garvin Fila, MD, Stroke MD Consultant(s):     Kathie Rhodes, NP with Lane Hacker, MD (Palliative Care Team) Patient's PCP:  Billie Ruddy, MD  DISCHARGE DIAGNOSIS:  Principal Problem:   ICH (intracerebral hemorrhage) ([place]) - R basal ganglia w/ IVH, SAH Active Problems:   Hyperlipidemia   Morbid obesity ([place])   Essential hypertension   Goals of care, counseling/discussion   Palliative care by specialist   DNR (do not resuscitate) discussion   Dysphagia   Hypertensive emergency   Comfort measures only status   Cerebral edema ([place])   Cerebral herniation ([place])   Malnutrition ([place])   Diarrhea   Past Medical History:  Diagnosis Date  . Asthma   . Back pain   . Depression   . Family history of arthritis   . Family history of diabetes mellitus   . Family history of ischemic heart disease   . Headache(784.0)   . High blood pressure   . High cholesterol   . Hyperlipidemia   . Hypertension   . Obesity   . Pain from implanted hardware 01/28/2012  . Personal history of unspecified circulatory disease   . Sciatica   . Shortness of breath   . Urinary incontinence    Past Surgical History:  Procedure Laterality Date  . ABDOMINAL HYSTERECTOMY    . BACK SURGERY  2009   Dr. Ronnald Ramp  [place]  . COLONOSCOPY    . HARDWARE REMOVAL  01/28/2012   Procedure: HARDWARE REMOVAL;  Surgeon: Johnny Bridge, MD;  Location: [facility];  Service: Orthopedics;  Laterality: Right;  Right Knee  . right knee surgery after fracture  2006  . SPINE SURGERY      Allergies as of 09/28/2019      Reactions   Amlodipine    Headache    Oxycodone Nausea And Vomiting   Penicillins Swelling   Pravastatin Other (See Comments)   Gives pt headaches   Tramadol Other (See Comments)   Pt  "unsure"      Medication List    STOP taking these medications   albuterol 108 (90 Base) MCG/ACT inhaler Commonly known as: ProAir HFA   losartan 25 MG tablet Commonly known as: COZAAR   pravastatin 10 MG tablet Commonly known as: PRAVACHOL     TAKE these medications   acetaminophen 650 MG suppository Commonly known as: TYLENOL Place 1 suppository (650 mg total) rectally every 4 (four) hours as needed for mild pain (or temp > 37.5 C (99.5 F)).   glycopyrrolate 0.2 MG/ML injection Commonly known as: ROBINUL Inject 1 mL (0.2 mg total) into the vein 3 (three) times daily.   glycopyrrolate 0.2 MG/ML injection Commonly known as: ROBINUL Inject 1 mL (0.2 mg total) into the vein every 4 (four) hours as needed (excessive secretions).   haloperidol 2 MG/ML solution Commonly known as: HALDOL Place 0.3 mLs (0.6 mg total) under the tongue every 4 (four) hours as needed for agitation (or delirium).   lip balm ointment Apply topically as needed for lip care.   LORazepam 2 MG/ML injection Commonly known as: ATIVAN Inject 0.5 mLs (1 mg total) into the vein every 4 (four) hours as needed for anxiety.   morphine 2 MG/ML injection Inject 1 mL (2 mg total) into  the vein every hour as needed (tachypnea, dyspnea).   polyvinyl alcohol 1.4 % ophthalmic solution Commonly known as: LIQUIFILM TEARS Place 1 drop into both eyes 4 (four) times daily as needed for dry eyes.       LABORATORY STUDIES CBC    Component Value Date/Time   WBC 15.0 (H) 09/25/2019 0256   RBC 4.88 09/25/2019 0256   HGB 13.2 09/25/2019 0256   HCT 41.7 09/25/2019 0256   PLT 276 09/25/2019 0256   MCV 85.5 09/25/2019 0256   MCH 27.0 09/25/2019 0256   MCHC 31.7 09/25/2019 0256   RDW 15.7 (H) 09/25/2019 0256   LYMPHSABS 3.4 09/25/2019 0256   MONOABS 1.6 (H) 09/25/2019 0256   EOSABS 0.3 09/25/2019 0256   BASOSABS 0.1 09/25/2019 0256   CMP    Component Value Date/Time   NA 145 09/25/2019 0256   K 3.8  09/25/2019 0256   CL 112 (H) 09/25/2019 0256   CO2 22 09/25/2019 0256   GLUCOSE 137 (H) 09/25/2019 0256   BUN 19 09/25/2019 0256   CREATININE 0.66 09/25/2019 0256   CREATININE 0.67 02/01/2018 1043   CALCIUM 8.9 09/25/2019 0256   PROT 7.5 09/17/2019 1309   ALBUMIN 4.0 09/17/2019 1309   AST 27 09/17/2019 1309   ALT 20 09/17/2019 1309   ALKPHOS 56 09/17/2019 1309   BILITOT 1.0 09/17/2019 1309   GFRNONAA >60 09/25/2019 0256   GFRNONAA 82 02/01/2018 1043   GFRAA >60 09/25/2019 0256   GFRAA 96 02/01/2018 1043   COAGS Lab Results  Component Value Date   INR 1.0 09/17/2019   INR 0.9 04/04/2008   Lipid Panel    Component Value Date/Time   CHOL 188 09/18/2019 0327   TRIG 179 (H) 09/20/2019 0610   HDL 47 09/18/2019 0327   CHOLHDL 4.0 09/18/2019 0327   VLDL UNABLE TO CALCULATE IF TRIGLYCERIDE OVER 400 mg/dL 09/18/2019 0327   LDLCALC UNABLE TO CALCULATE IF TRIGLYCERIDE OVER 400 mg/dL 09/18/2019 0327   LDLCALC 117 (H) 02/01/2018 1043   HgbA1C  Lab Results  Component Value Date   HGBA1C 6.3 (H) 09/18/2019   Urinalysis    Component Value Date/Time   COLORURINE YELLOW 09/22/2019 1230   APPEARANCEUR CLEAR 09/22/2019 1230   LABSPEC 1.015 09/22/2019 1230   PHURINE 5.0 09/22/2019 1230   GLUCOSEU NEGATIVE 09/22/2019 1230   HGBUR NEGATIVE 09/22/2019 1230   BILIRUBINUR NEGATIVE 09/22/2019 1230   KETONESUR NEGATIVE 09/22/2019 1230   PROTEINUR NEGATIVE 09/22/2019 1230   UROBILINOGEN 0.2 04/22/2011 1430   NITRITE NEGATIVE 09/22/2019 1230   LEUKOCYTESUR NEGATIVE 09/22/2019 1230    SIGNIFICANT DIAGNOSTIC STUDIES Ct Angio Head W Or Wo Contrast  Result Date: 09/17/2019 CLINICAL DATA:  Left facial droop. Follow-up intracranial hemorrhage EXAM: CT ANGIOGRAPHY HEAD TECHNIQUE: Multidetector CT imaging of the head was performed using the standard protocol during bolus administration of intravenous contrast. Multiplanar CT image reconstructions and MIPs were obtained to evaluate the vascular  anatomy. CONTRAST:  73m OMNIPAQUE IOHEXOL 350 MG/ML SOLN COMPARISON:  Written head CT earlier same day FINDINGS: CTA HEAD Anterior circulation: Both internal carotid arteries are patent through the skull base and siphon regions. No siphon stenosis. The anterior and middle cerebral vessels are patent without proximal stenosis, aneurysm or vascular malformation. No evidence of a high flow vascular lesion in the region of the right basal ganglia hemorrhage. No sign of visible active extravasation. No evidence of hematoma enlargement since the previous study, maximal dimension 4.9 cm front to back. Posterior circulation: Both vertebral  arteries are widely patent through the foramen magnum to the basilar. No basilar stenosis. Posterior circulation branch vessels are patent and normal. Venous sinuses: Patent and normal. Anatomic variants: None significant. IMPRESSION: No vascular lesion seen associated with the right basal ganglia hemorrhage. No intracranial large or medium vessel occlusion. No evidence of increased size of the right basal ganglia hemorrhage or of visible active extravasation. Electronically Signed   By: Nelson Chimes [name]   On: 09/17/2019 13:40   Dg Abd 1 View  Result Date: 09/21/2019 CLINICAL DATA:  Post feeding tube placement. EXAM: ABDOMEN - 1 VIEW; DG NASO G TUBE PLC W/FL-NO RAD COMPARISON:  None. FINDINGS: Feeding tube placed without radiologist. Post placement image demonstrates a feeding tube in the descending portion of the duodenum, small amount of Omnipaque was injected to confirm placement, 25 mL. Fluoro time: 1 minutes 24 seconds. IMPRESSION: Feeding tube placed into descending duodenum, contrast injected to confirm placement as above. Electronically Signed   By: Zetta Bills [name]   On: 09/21/2019 14:13   Ct Head Wo Contrast  Result Date: 09/25/2019 CLINICAL DATA:  Follow-up intracranial hemorrhage, less responsive today than yesterday, history hypertension EXAM: CT HEAD WITHOUT  CONTRAST TECHNIQUE: Contiguous axial images were obtained from the base of the skull through the vertex without intravenous contrast. Sagittal and coronal MPR images reconstructed from axial data set. COMPARISON:  09/24/2019 FINDINGS: Brain: Generalized atrophy. Large intracerebral hemorrhage again identified within the RIGHT hemisphere, 7.3 x 5.0 x 5.0 cm, previously 8.1 x 5.4 x 5.2 cm. Significant surrounding edema. Underlying small vessel chronic ischemic changes of deep cerebral white matter. Significant mass effect within RIGHT hemisphere with persistent midline shift. Additional high attenuation blood identified at the urine hemispheric fissure and dependently within the posterior LEFT lateral ventricle. No new areas of hemorrhage or infarction. No new extra-axial collections. Vascular: Atherosclerotic calcifications of internal carotid arteries at skull base Skull: Intact Sinuses/Orbits: Periorbital soft tissue swelling. Paranasal sinuses and mastoid air cells clear. Other: N/A IMPRESSION: Previously identified large RIGHT hemispheric intraparenchymal hemorrhage has slightly decreased in size though there is persistent surrounding edema, mass effect in the RIGHT hemisphere, and RIGHT to LEFT midline shift. Again identified blood at the interhemispheric fissure and within the dependent portion of the posterior LEFT lateral ventricle. Underlying atrophy and small vessel chronic ischemic changes of deep cerebral white matter. No new intracranial abnormalities. Electronically Signed   By: Lavonia Dana [name]   On: 09/25/2019 12:50   Ct Head Wo Contrast  Result Date: 09/24/2019 CLINICAL DATA:  Follow-up intracranial hemorrhage EXAM: CT HEAD WITHOUT CONTRAST TECHNIQUE: Contiguous axial images were obtained from the base of the skull through the vertex without intravenous contrast. COMPARISON:  Two days ago FINDINGS: Brain: Extensive hemorrhage within the right cerebral hemisphere with dominant putaminal based  hematoma measuring 5 x 7 x 4.6 cm, stable from prior. Patchy hemorrhage along the right caudate nucleus and intervening white matter is also unchanged. Regional edema and mass effect is unchanged. Due to head obliquity midline shift is measured on coronal reformats at 5 mm. Mild intraventricular hemorrhage within the occipital horns of the lateral ventricles with stable ventricular volume. No new site of hemorrhage. No acute ischemia. Vascular: Negative Skull: No acute finding Sinuses/Orbits: No acute finding IMPRESSION: Unchanged lobar and intraventricular hemorrhage with edema and mass effect. Electronically Signed   By: Monte Fantasia [name]   On: 09/24/2019 06:10   Ct Head Wo Contrast  Result Date: 09/22/2019 CLINICAL DATA:  Follow-up examination for  intracranial hemorrhage. EXAM: CT HEAD WITHOUT CONTRAST TECHNIQUE: Contiguous axial images were obtained from the base of the skull through the vertex without intravenous contrast. COMPARISON:  Prior CT from 09/20/2019 FINDINGS: Brain: Large intraparenchymal hemorrhage centered at the right basal ganglia again seen, not significantly changed in size measuring 5.1 x 6.9 x 4.6 cm (estimated volume 81 cc). Surrounding vasogenic edema with regional mass effect is similar, although more hypodense as compared to previous exam. Intraventricular extension with blood in the lateral and third ventricles seen. Right lateral ventricle effaced with asymmetric dilatation of the left lateral ventricle, compatible with ventricular trapping, stable. Basilar cistern crowding relatively stable. Associated right-to-left midline shift measures up to 10 mm, not significantly changed. No new intracranial hemorrhage. No other acute large vessel territory infarct. Underlying atrophy with chronic microvascular ischemic disease is stable. Vascular: No hyperdense vessel. Skull: Scalp soft tissues and calvarium demonstrate no new abnormality. Sinuses/Orbits: Globes and orbital soft tissues  within normal limits. Paranasal sinuses remain largely clear. Nasogastric tube in place. Trace left mastoid effusion. Other: None. IMPRESSION: 1. No significant interval change in size and appearance of large right basal ganglia intraparenchymal hemorrhage with intraventricular extension. Associated regional mass effect with 10 mm of right-to-left midline shift, not significantly changed. 2. No other new acute intracranial abnormality. Electronically Signed   By: Jeannine Boga [name]   On: 09/22/2019 05:02   Ct Head Wo Contrast  Result Date: 09/20/2019 CLINICAL DATA:  Intracranial hemorrhage, known, follow-up. EXAM: CT HEAD WITHOUT CONTRAST TECHNIQUE: Contiguous axial images were obtained from the base of the skull through the vertex without intravenous contrast. COMPARISON:  Head CT 09/18/2019 FINDINGS: Brain: Motion degraded exam. Again demonstrated is a very large parenchymal hemorrhage centered within the right basal ganglia and extending superiorly into the right frontoparietal lobes. The hemorrhage has not significantly changed in size since prior head CT 09/18/2019, with the dominant portion of the hemorrhage measuring 7.8 x 5.6 x 4.7 cm (AP x TV x CC) (previously 7.8 x 5.3 x 4.7 cm). Similar surrounding parenchymal edema. Intraventricular extension of hemorrhage into the lateral, third and fourth ventricles is similar to prior exam. Significant mass effect with effacement of right cerebral sulci, near complete effacement of the right lateral ventricle and asymmetric prominence of the left lateral ventricle concerning for ventricular entrapment. Unchanged 13 mm leftward midline shift (remeasured on prior) with suspected component of early right uncal herniation. Vascular: No hyperdense vessel. Skull: No calvarial fracture. Sinuses/Orbits: Mild scattered paranasal sinus mucosal thickening. No significant mastoid effusion. IMPRESSION: 1. No significant interval change as compared to head CT 09/18/2019  performed at 1:35 a.m. 2. Similar size of a very large parenchymal hemorrhage centered within the right basal ganglia. 3. Associated mass effect with near complete effacement of the right lateral ventricle, 13 mm leftward midline shift and asymmetric prominence of the left lateral ventricle concerning for ventricular entrapment. Early uncal herniation again suspected. 4. Unchanged intraventricular extension of hemorrhage. Electronically Signed   By: Kellie Simmering   On: 09/20/2019 15:19   Ct Head Wo Contrast  Result Date: 09/18/2019 CLINICAL DATA:  85 year old female with follow-up intracranial hemorrhage. EXAM: CT HEAD WITHOUT CONTRAST TECHNIQUE: Contiguous axial images were obtained from the base of the skull through the vertex without intravenous contrast. COMPARISON:  Head CT dated 09/17/2019. FINDINGS: Brain: Significant interval increase in the size of the right basal ganglia intraparenchymal hemorrhage now measuring approximately 7.8 x 5.3 cm in greatest axial dimensions (previously approximately 5.0 x 2.7 cm). There is  extension of blood into the right lateral ventricle, third ventricle, and the fourth ventricle. Small amount of layering blood is noted in the occipital horn of the left lateral ventricle. There is associated mass effect and compression of the right lateral ventricle and increase in the left lateral ventricular size since the prior CT indicative of a degree of occlude obstruction and hydrocephalus. There is approximately 15 mm right to left midline shift, measured at the level of foramen Monro (previously 4 mm). There is effacement of the right quadrigeminal plate cistern which may represent a degree uncal herniation. Vascular: No hyperdense vessel or unexpected calcification. Skull: Normal. Negative for fracture or focal lesion. Sinuses/Orbits: No acute finding. Other: None IMPRESSION: 1. Significant interval increase in the size of the right basal ganglia intraparenchymal hemorrhage as  well as increase in the intraventricular extension of blood. There is associated mild interval dilatation of the left lateral ventricle indicative of a degree of ventricular outflow obstruction. Neurosurgery consult is advised. 2. Increase in mass effect and 15 mm right to left midline shift (previously 4 mm). These results were called by telephone at the time of interpretation on 09/18/2019 at 2:05 am to Dr. Leonel Ramsay, who verbally acknowledged these results. Electronically Signed   By: Anner Crete [name]   On: 09/18/2019 02:11   Dg Chest Port 1 View  Result Date: 09/22/2019 CLINICAL DATA:  Fever. EXAM: PORTABLE CHEST 1 VIEW COMPARISON:  Radiograph of September 18, 2019. FINDINGS: Stable cardiomediastinal silhouette. Feeding tube is seen entering stomach. No pneumothorax or pleural effusion is noted. Mild bibasilar subsegmental atelectasis is noted. Bony thorax is unremarkable. IMPRESSION: Feeding tube seen entering stomach. Mild bibasilar subsegmental atelectasis. Electronically Signed   By: Marijo Conception [name]   On: 09/22/2019 11:41   Dg Chest Port 1 View  Result Date: 09/18/2019 CLINICAL DATA:  Intra cerebral hemorrhage. EXAM: PORTABLE CHEST 1 VIEW COMPARISON:  09/17/2019 and 07/11/2018 FINDINGS: The heart size and pulmonary vascularity are normal. Interstitial markings are slightly accentuated due to a shallow inspiration. No consolidative infiltrates or effusions. No acute bone abnormality. IMPRESSION: No acute abnormalities. No change since the prior exam. Electronically Signed   By: Lorriane Shire [name]   On: 09/18/2019 08:13   Dg Chest Port 1 View  Result Date: 09/17/2019 CLINICAL DATA:  Acute onset left-sided weakness today. EXAM: PORTABLE CHEST 1 VIEW COMPARISON:  Single-view of the chest 07/11/2018 and PA and lateral chest 09/15/2015. FINDINGS: The lungs are clear. Heart size is normal. No pneumothorax or pleural fluid. Atherosclerosis is noted. IMPRESSION: No acute disease.  Atherosclerosis. Electronically Signed   By: Inge Rise [name]   On: 09/17/2019 16:27   Dg Addison Bailey G Tube Plc W/fl-no Rad  Result Date: 09/21/2019 CLINICAL DATA:  Post feeding tube placement. EXAM: ABDOMEN - 1 VIEW; DG NASO G TUBE PLC W/FL-NO RAD COMPARISON:  None. FINDINGS: Feeding tube placed without radiologist. Post placement image demonstrates a feeding tube in the descending portion of the duodenum, small amount of Omnipaque was injected to confirm placement, 25 mL. Fluoro time: 1 minutes 24 seconds. IMPRESSION: Feeding tube placed into descending duodenum, contrast injected to confirm placement as above. Electronically Signed   By: Zetta Bills [name]   On: 09/21/2019 14:13   Ct Head Code Stroke Wo Contrast  Result Date: 09/17/2019 CLINICAL DATA:  Code stroke. Left facial droop. Measured on Dr. Camille Bal radiology EXAM: CT HEAD WITHOUT CONTRAST TECHNIQUE: Contiguous axial images were obtained from the base of the skull through the vertex  without intravenous contrast. COMPARISON:  None. FINDINGS: Brain: An acute hyperdense hemorrhage in the right basal ganglia measures 4.1 x 2.7 x 4.7 cm. Additional scattered hemorrhage extends more medially in the basal ganglia. There is intraventricular extension. Subarachnoid blood is also noted adjacent to the right temporal tip. There is no hydrocephalus. There is mass effect with 4 mm midline shift. A remote lacunar infarct is present in the left lentiform nucleus. Moderate diffuse white matter disease is again seen. The brainstem and cerebellum are normal. Vascular: Atherosclerotic calcifications are present within the cavernous internal carotid arteries. There is no hyperdense vessel. Skull: Calvarium is intact. No focal lytic or blastic lesions are present. Sinuses/Orbits: The paranasal sinuses and mastoid air cells are clear. Bilateral lens replacements are noted. Globes and orbits are otherwise unremarkable. IMPRESSION: 1. Large right basal ganglia hemorrhage  with intraventricular and subarachnoid extension and 4 mm of right-to-left midline shift. Critical Value/emergent results were called by telephone at the time of interpretation on 09/17/2019 at 1:27 pm to providerSCOTT GOLDSTON's PA , who verbally acknowledged these results. The above was relayed via text pager to Dr. Cheral Marker on 09/17/2019 at 13:28 . Electronically Signed   By: San Morelle [name]   On: 09/17/2019 13:33   Vas US Carotid  Result Date: 09/19/2019 Carotid Arterial Duplex Study Indications:       CVA. Risk Factors:      Hypertension, hyperlipidemia. Comparison Study:  no prior Performing Technologist: Abram Sander RVS  Examination Guidelines: A complete evaluation includes B-mode imaging, spectral Doppler, color Doppler, and power Doppler as needed of all accessible portions of each vessel. Bilateral testing is considered an integral part of a complete examination. Limited examinations for reoccurring indications may be performed as noted.  Right Carotid Findings: +----------+--------+--------+--------+------------------+--------+           PSV cm/sEDV cm/sStenosisPlaque DescriptionComments +----------+--------+--------+--------+------------------+--------+ CCA Prox  101     12              heterogenous               +----------+--------+--------+--------+------------------+--------+ CCA Distal95      13              heterogenous               +----------+--------+--------+--------+------------------+--------+ ICA Prox  91      11      1-39%   heterogenous               +----------+--------+--------+--------+------------------+--------+ ICA Distal66      11                                         +----------+--------+--------+--------+------------------+--------+ ECA       141     2                                          +----------+--------+--------+--------+------------------+--------+ +----------+--------+-------+--------+-------------------+            PSV cm/sEDV cmsDescribeArm Pressure (mmHG) +----------+--------+-------+--------+-------------------+ Subclavian110                                        +----------+--------+-------+--------+-------------------+ +---------+--------+--+--------+--+---------+ VertebralPSV cm/s50EDV cm/s11Antegrade +---------+--------+--+--------+--+---------+  Left Carotid Findings: +----------+--------+--------+--------+------------------+--------+  PSV cm/sEDV cm/sStenosisPlaque DescriptionComments +----------+--------+--------+--------+------------------+--------+ CCA Prox  148                     heterogenous               +----------+--------+--------+--------+------------------+--------+ CCA Distal87      14              heterogenous               +----------+--------+--------+--------+------------------+--------+ ICA Prox  79      17      1-39%   heterogenous               +----------+--------+--------+--------+------------------+--------+ ICA Distal101     24                                         +----------+--------+--------+--------+------------------+--------+ ECA       119                                                +----------+--------+--------+--------+------------------+--------+ +----------+--------+--------+--------+-------------------+           PSV cm/sEDV cm/sDescribeArm Pressure (mmHG) +----------+--------+--------+--------+-------------------+ Subclavian114                                         +----------+--------+--------+--------+-------------------+ +---------+--------+--+--------+-+ VertebralPSV cm/s52EDV cm/s4 +---------+--------+--+--------+-+  Summary: Right Carotid: Velocities in the right ICA are consistent with a 1-39% stenosis. Left Carotid: Velocities in the left ICA are consistent with a 1-39% stenosis. Vertebrals: Bilateral vertebral arteries demonstrate antegrade flow. *See table(s) above for  measurements and observations.  Electronically signed by Antony Contras MD on 09/19/2019 at 8:12:59 AM.    Final     2D Echocardiogram  1. Left ventricular ejection fraction, by visual estimation, is 65 to 70%. The left ventricle has hyperdynamic function. Normal left ventricular size. There is no left ventricular hypertrophy.  2. Definity contrast agent was given IV to delineate the left ventricular endocardial borders.  3. Left ventricular diastolic Doppler parameters are consistent with impaired relaxation pattern of LV diastolic filling.  4. Global right ventricle has normal systolic function.The right ventricular size is normal. No increase in right ventricular wall thickness.  5. Left atrial size was normal.  6. Right atrial size was normal.  7. The mitral valve is normal in structure. No evidence of mitral valve regurgitation.  8. The tricuspid valve is normal in structure. Tricuspid valve regurgitation was not visualized by color flow Doppler.  9. The aortic valve is normal in structure. Aortic valve regurgitation was not visualized by color flow Doppler. 10. The pulmonic valve was grossly normal. Pulmonic valve regurgitation is not visualized by color flow Doppler. 11. TR signal is inadequate for assessing pulmonary artery systolic pressure.    HISTORY OF PRESENT ILLNESS [name] is an 85 y.o. female with a history of sciatica, obesity, hypertension, hyperlipidemia and headache.  Patient was last known well at 12:00 noon today 09/17/2019.  Per EMS, patient was out shopping with daughter.  They arrived home and as she was going up the stairs she suddenly had left-sided flaccidity along with left facial droop and dysarthria.  EMS was called and  patient was immediately brought to [facility].  On arrival patient continued to have left facial droop, right eye gaze deviation, left-sided flaccidity along with no sensation on the left side.  CT was obtained and patient was noted to have  an intracranial hemorrhage. Premorbid modified Rankin scale (mRS): 1. NIH stroke scale 27. She was admitted to the neuro ICU for further evaluation and treatment.   HOSPITAL COURSE [name] is a 85 y.o. female with history of sciatica, obesity, hypertension, hyperlipidemiaandheadache presenting with left facial droop, right eye gaze deviation, left-sided flaccidity and no sensation on the left side.   ICH: Hypertensive R large basal ganglia hemorrhage w/ IVH and SAH with cerebral edema and transfalcine herniation  Code Stroke CT head large R basal ganglia hemorrhage w/ IVH and SAH, 4 mm midline shift.     CTA head no vascular lesion seen. No ELVO.  Repeat CT head 9/22 significant increase in size of R basal ganglia ICH and IVH. Mild dilatation L lateral ventricle. Increased midline shift to 15 mm.  Carotid Doppler unremarkable  CT repeat 9/24 no significant change R basal ganglia hemorrhage 45m midline shift, concern for ventricular entrapment and early uncal herniation. IVH stable.  CT repeat 9/26 - No significant change of ICH w/ IVH R basal ganglia. regional mass effect with 10 mm right-to-left midline shift stable  CT repeat 9/28 unchanged lobar hemorrhage and IVH w/ edema and mass effect  Neuro worse today. Repeat CT head 9/29 repeat stat CT scan of the head  shows slight decrease in right hemispheric parenchymal hemorrhage though there is still persistent edema and mass-effect and right-to-left shift which is unchanged.  2D Echo EF 65 to 70%  LDL UTC d/t TG > 400. Direct LDL 95.2  HgbA1c 6.3  aspirin 81 mg daily prior to admission  Therapy recommendations:  SNF  Palliative care met and discussed with family. Over time, given neuro decline and functional inabilities, family decided on comfort care  Code status - DNR  [place]  Cerebral Edema and Transfalcine Herniation  Increase of hemorrhage size since admission on  repeat CT  Repeat CT no significant change R basal ganglia hemorrhage 119mmidline shift, concern for ventricular entrapment and early uncal herniation. IVH stable.  CT repeat 9/26 - No significant interval change in size and appearance of large right basal ganglia intraparenchymal hemorrhage with intraventricular extension. Associated regional mass effect with 10 mm of right-to-left midline shift, not significantly changed.   CT repeat 9/28 unchanged lobar hemorrhage and IVH w/ edema and mass effect  Treated intermittently with 3% protocol  Now on comfort care measures  Hypertensive Emergency  BP as high as 200/110  Home meds:  Cozaar 25  Treated with Cleviprex drip in ICU  Treated with labetalol drip in ICU  Was on Labetalol and hydralazine PRN  SBP goal < 160 in hospital until made comfort care  Hyperlipidemia  Home meds:  Pravachol 10  LDL Direct LDL 95.2, TG > 400, goal < 70.   Statin on hold given ICH  Dysphagia Malnutrition d/t inadequate oral intake  Secondary to stroke  Did not pass swallow  NPO  Had Cortrak and TF in hospital.   dtr does not think she would want PEG  Now on comfort care   Other Stroke Risk Factors  Advanced age  Former Cigarette smoker, quit 25 yrs ago  Morbid Obesity, Body mass index is 45.69 kg/m., recommend weight loss,  diet and exercise as appropriate   Family hx stroke (father, brother)  Other Active Problems  Leukocytosis - likely reactive.  CXR ok.    Fever - 100.1->afebrile - CXR (Mild bibasilar subsegmental atelectasis) ; U/A & Culture and blood Cultures neg  Hypokalemia, resolved  multiple loose stools - C-diff  Negative. Thought to be secondary to Osmolite tube feeding supplement   DISCHARGE EXAM Blood pressure 127/71, pulse 87, temperature 99 F (37.2 C), temperature source Oral, resp. rate 16, height _0  (1.6 m), weight 117 kg, SpO2 94 %. General - obese, elderly African-American lady,  unresponsive, tachypnea without distress Cardiovascular - Regular rhythm and rate. Neurological Exam : limited due to comfort care. Unresponsive, eyes closed, did open to voice and pain. Not following any commands. Severe left facial droop. No movement spontaneously bilaterally. Sensation, coordination and gait not tested.  Discharge Diet   NPO  DISCHARGE PLAN  Disposition:  [facility] for ongoing comfort care - d/c Saturday 09/29/2019  35 minutes were spent preparing discharge.  Rosalin Hawking, MD PhD Stroke Neurology 09/29/2019 12:29 PM

## 2019-09-28 NOTE — Progress Notes (Signed)
Palliative:  HPI:85 y.o.femalewith past medical history of obesity, HTN, and HLDadmitted on 9/21/2020with L facial droop, dysarthria, and L side flaccidity. CT obtained and patient found to have intracranial hemorrhagewith edema and shift. PMT consulted for [place] per daughter's request.[name] Full comfort care 10/1. Awaiting hospice bed.    I met today at [facility] bedside. No family at bedside. She appears comfortable. Comfort medications reviewed and in place. She continues to be unresponsive but is moving her right arm some but non-purposeful movements. May be becoming a little restless. Emotional support provided.   Exam: Unresponsive. Breathing regular, unlabored with some secretions/gurgling and I repositioned and this improved a little. Generalized edema.   Plan: - Comfort meds adjusted to better ensure comfort. - Awaiting hospice bed should be available for 10/3 transfer.   15 min  Kendra Sill, NP Palliative Medicine Team Pager [phone] (Please see [email or web address] for schedule) Team Phone [phone]    Greater than 50%  of this time was spent counseling and coordinating care related to the above assessment and plan

## 2019-09-28 NOTE — Progress Notes (Addendum)
Manufacturing engineer [facility])  [facility] does not have any bed availability today.  Family updated.   ACC will reach out to family and [facility] manager should a bed become available.  Venia Carbon RN, BSN, [facility] Liaison (in [place]) [phone]  ** update, [facility] will have a bed on 10/3.  Family will meet with liaison in am to complete paperwork.  ACC will reach out to [facility] manager once complete so ambulance transport can be arranged.

## 2019-09-29 NOTE — Progress Notes (Signed)
[facility] is available for patient this morning. Plan to complete paper work with daughter [name] at [facility] this morning at 9:00.   Discharge summary has been sent to [facility].   Will update TOC manager when paper work is complete.   RN please call report to [facility] prior to patient leaving the unit. [phone].  Thank you,  Erling Conte, LCSW [phone]

## 2019-09-29 NOTE — Progress Notes (Signed)
CSW received a call from Eva in admissions at [facility] stating the patient has been offered a bed and has been accepted and that the pt can arrive on 10/3.  The pt's accepting doctor is Dr. Hertweck.  The room number will be TBD.  The number for report is [phone].  CSW will update RN and EDP.  Jamyrah Saur F. Shakeerah Gradel, LCSWA, LCAS Clinical Social Worker Ph: [phone]    

## 2019-09-29 NOTE — Progress Notes (Signed)
PTAR called at RN's request.  CSW will continue to follow for D/C needs.  Alphonse Guild. Venise Ellingwood, LCSW, LCAS, CSI Transitions of Care Clinical Social Worker Care Coordination Department Ph: [phone]

## 2019-09-29 NOTE — Progress Notes (Signed)
Pt's DNR that is awaiting a signature from the provider is on pt's chart along with the pt's D/C packet.  RN aware and will call CSW when DNR is signed so that CSW can call PTAR.  CSW will continue to follow for D/C needs.  Alphonse Guild. Laguana Desautel, LCSW, LCAS, CSI Transitions of Care Clinical Social Worker Care Coordination Department Ph: [phone]

## 2019-09-29 NOTE — TOC Transition Note (Signed)
Transition of Care [facility]) - CM/SW Discharge Note   Patient Details  Name: [name] MRN: [id number] Date of Birth: 03-05-1934  Transition of Care [facility] CM/SW Contact:  Claudine Mouton, LCSW Phone Number: 09/29/2019, 1:05 PM   Clinical Narrative:   Pt's daughter aware and PTAR called now.    Final next level of care: [place] Barriers to Discharge: No Barriers Identified   Patient Goals and CMS Choice Patient states their goals for this hospitalization and ongoing recovery are:: Family wishes for pt to receive comfort care.   Choice offered to / list presented to : NA  Discharge Placement              Patient chooses bed at: [facility]) Patient to be transferred to facility by: Corey Harold) Name of family member notified: Paula Libra (daughter) at ph: [phone] Patient and family notified of of transfer: 09/29/19  Discharge Plan and Services                                     Social Determinants of Health (SDOH) Interventions     Readmission Risk Interventions No flowsheet data found.

## 2019-09-29 NOTE — Progress Notes (Signed)
Patient remains unresponsive eyes closed, breaths intermittently labored. Report called to [facility]. PIV removed x2 and rectal tubes removed.  Discharged via stretcher per PTAR.

## 2019-10-28 DEATH — deceased

## 2020-09-26 IMAGING — RF DG ABDOMEN 1V
1 series · 1 of 1 positions shown · non-contrast
Comparison: None.

CLINICAL DATA: Post feeding tube placement.

EXAM:
ABDOMEN - 1 VIEW; DG NASO G TUBE IVETTE PELL/FADYL KAUTE

[Series 1: cp_standard · 0.25mm/px · 1 of 1 slices shown]
[im 1/1]
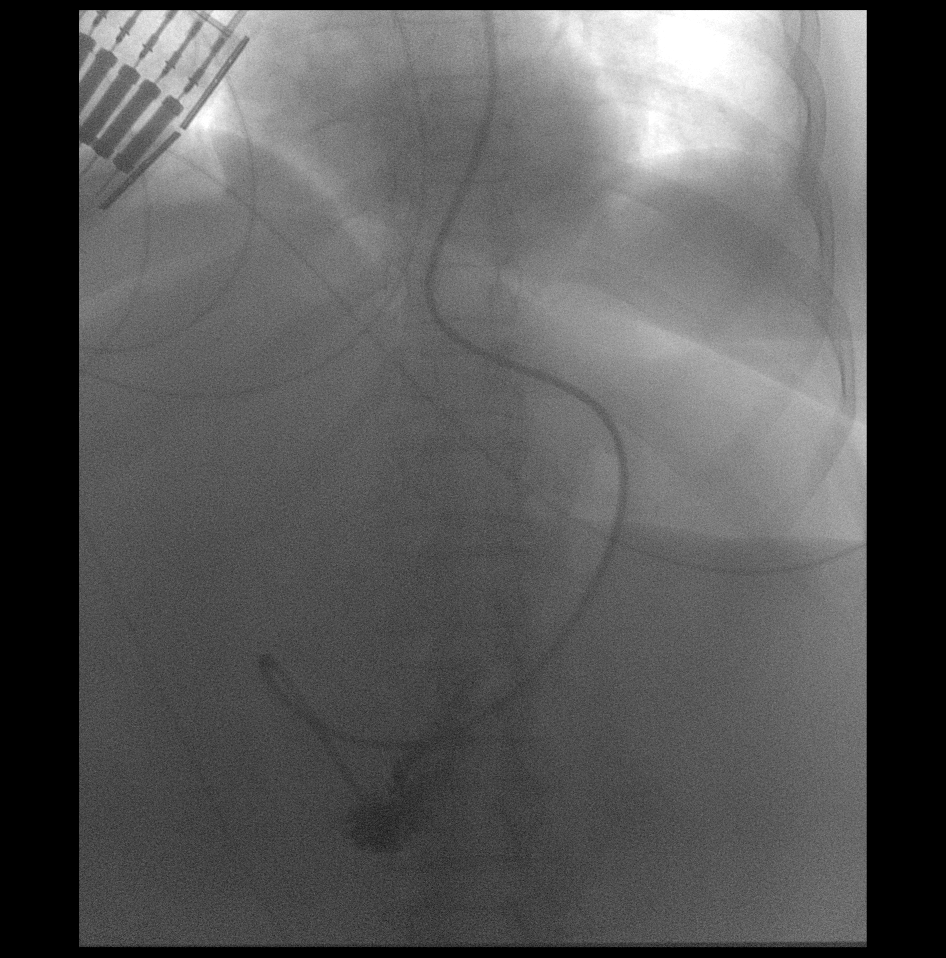

[1 of 1 positions shown; findings below may reference images not displayed]

FINDINGS: Feeding tube placed without radiologist. Post placement image
demonstrates a feeding tube in the descending portion of the
duodenum, small amount of Omnipaque was injected to confirm
placement, 25 mL.

Fluoro time: 1 minutes 24 seconds.
IMPRESSION: Feeding tube placed into descending duodenum, contrast injected to
confirm placement as above.

## 2020-09-27 IMAGING — DX DG CHEST 1V PORT
1 series · 1 of 1 positions shown · non-contrast
Comparison: Radiograph September 18, 2019.

CLINICAL DATA: Fever.

EXAM:
PORTABLE CHEST 1 VIEW

[chest]
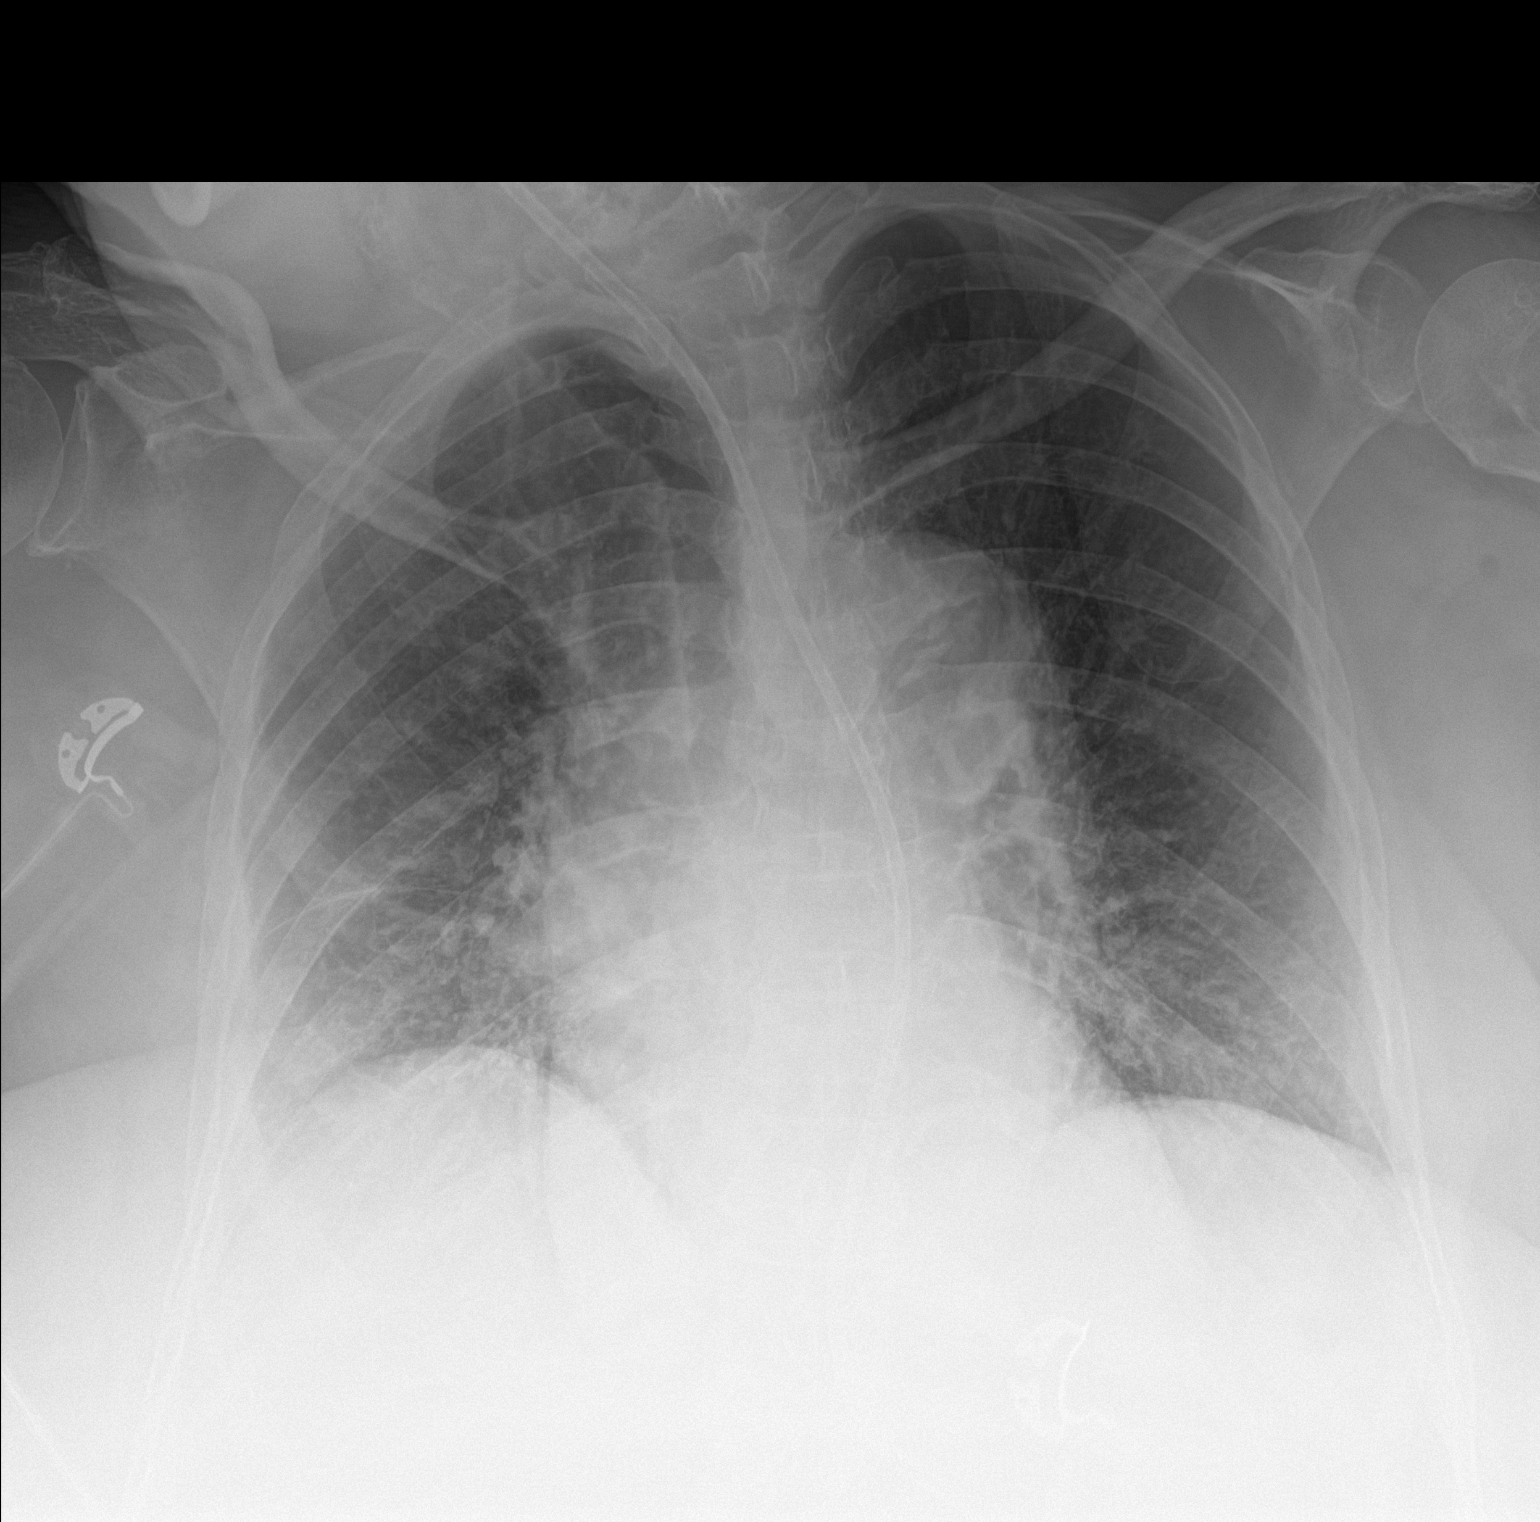

[1 of 1 positions shown; findings below may reference images not displayed]

FINDINGS: Stable cardiomediastinal silhouette. Feeding tube is seen entering
stomach. No pneumothorax or pleural effusion is noted. Mild
bibasilar subsegmental atelectasis is noted. Bony thorax is
unremarkable.
IMPRESSION: Feeding tube seen entering stomach. Mild bibasilar subsegmental
atelectasis.

## 2021-01-29 ENCOUNTER — Telehealth: Payer: Self-pay | Admitting: Family Medicine

## 2021-01-29 NOTE — Telephone Encounter (Signed)
Per [place] patient passed away on 2020-10-18
